# Patient Record
Sex: Female | Born: 1965 | Race: White | Hispanic: No | Marital: Married | Smoking: Never smoker
Health system: Southern US, Community
[De-identification: ages and names within clinical notes are randomized; demographics above are authoritative.]

## PROBLEM LIST (undated history)

## (undated) DIAGNOSIS — K219 Gastro-esophageal reflux disease without esophagitis: Secondary | ICD-10-CM

## (undated) DIAGNOSIS — G4730 Sleep apnea, unspecified: Secondary | ICD-10-CM

## (undated) DIAGNOSIS — F329 Major depressive disorder, single episode, unspecified: Secondary | ICD-10-CM

## (undated) DIAGNOSIS — H269 Unspecified cataract: Secondary | ICD-10-CM

## (undated) DIAGNOSIS — R519 Headache, unspecified: Secondary | ICD-10-CM

## (undated) DIAGNOSIS — D649 Anemia, unspecified: Secondary | ICD-10-CM

## (undated) DIAGNOSIS — E119 Type 2 diabetes mellitus without complications: Secondary | ICD-10-CM

## (undated) DIAGNOSIS — K2270 Barrett's esophagus without dysplasia: Secondary | ICD-10-CM

## (undated) DIAGNOSIS — F419 Anxiety disorder, unspecified: Secondary | ICD-10-CM

## (undated) DIAGNOSIS — Z8719 Personal history of other diseases of the digestive system: Secondary | ICD-10-CM

## (undated) DIAGNOSIS — Z87442 Personal history of urinary calculi: Secondary | ICD-10-CM

## (undated) DIAGNOSIS — M1990 Unspecified osteoarthritis, unspecified site: Secondary | ICD-10-CM

## (undated) DIAGNOSIS — K589 Irritable bowel syndrome without diarrhea: Secondary | ICD-10-CM

## (undated) DIAGNOSIS — B019 Varicella without complication: Secondary | ICD-10-CM

## (undated) DIAGNOSIS — I10 Essential (primary) hypertension: Secondary | ICD-10-CM

## (undated) DIAGNOSIS — F32A Depression, unspecified: Secondary | ICD-10-CM

## (undated) DIAGNOSIS — R7303 Prediabetes: Secondary | ICD-10-CM

## (undated) DIAGNOSIS — R51 Headache: Secondary | ICD-10-CM

## (undated) DIAGNOSIS — M797 Fibromyalgia: Secondary | ICD-10-CM

## (undated) DIAGNOSIS — E785 Hyperlipidemia, unspecified: Secondary | ICD-10-CM

## (undated) DIAGNOSIS — K8020 Calculus of gallbladder without cholecystitis without obstruction: Secondary | ICD-10-CM

## (undated) DIAGNOSIS — E669 Obesity, unspecified: Secondary | ICD-10-CM

## (undated) HISTORY — PX: TONSILLECTOMY: SUR1361

## (undated) HISTORY — PX: COLONOSCOPY: SHX174

## (undated) HISTORY — PX: KNEE ARTHROSCOPY: SUR90

## (undated) HISTORY — DX: Calculus of gallbladder without cholecystitis without obstruction: K80.20

## (undated) HISTORY — PX: CHOLECYSTECTOMY: SHX55

## (undated) HISTORY — DX: Personal history of other diseases of the digestive system: Z87.19

## (undated) HISTORY — PX: ABDOMINAL HYSTERECTOMY: SHX81

## (undated) HISTORY — DX: Type 2 diabetes mellitus without complications: E11.9

## (undated) HISTORY — DX: Irritable bowel syndrome, unspecified: K58.9

## (undated) HISTORY — PX: UPPER GASTROINTESTINAL ENDOSCOPY: SHX188

## (undated) HISTORY — DX: Unspecified cataract: H26.9

## (undated) HISTORY — DX: Varicella without complication: B01.9

## (undated) HISTORY — DX: Hyperlipidemia, unspecified: E78.5

## (undated) HISTORY — DX: Barrett's esophagus without dysplasia: K22.70

## (undated) HISTORY — DX: Anxiety disorder, unspecified: F41.9

## (undated) HISTORY — DX: Obesity, unspecified: E66.9

## (undated) HISTORY — PX: OTHER SURGICAL HISTORY: SHX169

## (undated) HISTORY — PX: LAPAROSCOPIC CHOLECYSTECTOMY: SUR755

---

## 2013-12-25 DIAGNOSIS — G629 Polyneuropathy, unspecified: Secondary | ICD-10-CM | POA: Insufficient documentation

## 2014-01-31 DIAGNOSIS — R7309 Other abnormal glucose: Secondary | ICD-10-CM | POA: Insufficient documentation

## 2014-01-31 DIAGNOSIS — E118 Type 2 diabetes mellitus with unspecified complications: Secondary | ICD-10-CM | POA: Insufficient documentation

## 2014-01-31 DIAGNOSIS — E236 Other disorders of pituitary gland: Secondary | ICD-10-CM | POA: Insufficient documentation

## 2014-01-31 DIAGNOSIS — R635 Abnormal weight gain: Secondary | ICD-10-CM | POA: Insufficient documentation

## 2014-01-31 DIAGNOSIS — R7303 Prediabetes: Secondary | ICD-10-CM | POA: Insufficient documentation

## 2016-12-12 ENCOUNTER — Encounter (HOSPITAL_COMMUNITY): Payer: Self-pay | Admitting: *Deleted

## 2016-12-12 ENCOUNTER — Emergency Department (HOSPITAL_BASED_OUTPATIENT_CLINIC_OR_DEPARTMENT_OTHER)
Admit: 2016-12-12 | Discharge: 2016-12-12 | Disposition: A | Discharge status: Home / Self Care | Payer: 59 | Attending: Emergency Medicine | Admitting: Emergency Medicine

## 2016-12-12 ENCOUNTER — Emergency Department (HOSPITAL_COMMUNITY)
Admission: EM | Admit: 2016-12-12 | Discharge: 2016-12-12 | Disposition: A | Discharge status: Home / Self Care | Payer: 59 | Attending: Emergency Medicine | Admitting: Emergency Medicine

## 2016-12-12 ENCOUNTER — Ambulatory Visit (HOSPITAL_COMMUNITY)
Admission: EM | Admit: 2016-12-12 | Discharge: 2016-12-12 | Disposition: A | Discharge status: Nursing Home (Includes ICF) | Payer: Self-pay

## 2016-12-12 DIAGNOSIS — I10 Essential (primary) hypertension: Secondary | ICD-10-CM | POA: Diagnosis not present

## 2016-12-12 DIAGNOSIS — M79609 Pain in unspecified limb: Secondary | ICD-10-CM | POA: Diagnosis not present

## 2016-12-12 DIAGNOSIS — Z79899 Other long term (current) drug therapy: Secondary | ICD-10-CM | POA: Diagnosis not present

## 2016-12-12 DIAGNOSIS — M7121 Synovial cyst of popliteal space [Baker], right knee: Secondary | ICD-10-CM | POA: Insufficient documentation

## 2016-12-12 DIAGNOSIS — M25561 Pain in right knee: Secondary | ICD-10-CM | POA: Diagnosis present

## 2016-12-12 HISTORY — DX: Essential (primary) hypertension: I10

## 2016-12-12 HISTORY — DX: Unspecified osteoarthritis, unspecified site: M19.90

## 2016-12-12 HISTORY — DX: Fibromyalgia: M79.7

## 2016-12-12 HISTORY — DX: Gastro-esophageal reflux disease without esophagitis: K21.9

## 2016-12-12 MED ORDER — OXYCODONE-ACETAMINOPHEN 5-325 MG PO TABS
1.0000 | ORAL_TABLET | Freq: Two times a day (BID) | ORAL | 0 refills | Status: DC | PRN
Start: 2016-12-12 — End: 2017-07-09

## 2016-12-12 NOTE — ED Triage Notes (Signed)
Pt reports right leg pain behind her knee and radiates up her leg. Reports swelling to leg last night. Is ambulatory at triage.

## 2016-12-12 NOTE — ED Provider Notes (Signed)
[facility] DEPT Provider Note   CSN: [id number] Arrival date & time: 12/12/16  [id number]  By signing my name below, I, Arianna Nassar, attest that this documentation has been prepared under the direction and in the presence of Merrily Pew, MD.  Electronically Signed: Julien Nordmann, ED Scribe. 12/12/16. 6:15 PM.    History   Chief Complaint Chief Complaint  Patient presents with  . Leg Pain    The history is provided by the patient. No language interpreter was used.   HPI Comments: Dawn Reynolds is a 51 y.o. female who presents to the Emergency Department complaining of intermittent, gradual worsening, right leg pain that begins behind her right knee and radiates into right leg that began last night. She describes her pain a sharp, shooting sensation. Pt has been driving back and forth from [place] at least 1-2x a week for the past 4 months. She expresses she is unable to sleep at night due to increased pain. She reports taking 800 mg of ibuprofen and flexeril today to alleviate her pain without relief. She has a hx of Baker cyst's in the past and reports that this current pain feels very different. Pt denies fever, chest pain, or shortness of breath.   Past Medical History:  Diagnosis Date  . Arthritis   . Fibromyalgia   . GERD (gastroesophageal reflux disease)   . Hypertension     There are no active problems to display for this patient.   History reviewed. No pertinent surgical history.  OB History    No data available       Home Medications    Prior to Admission medications   Medication Sig Start Date End Date Taking? Authorizing Provider  amLODipine (NORVASC) 10 MG tablet Take 10 mg by mouth daily.   Yes Historical Provider, MD  BIOTIN PO Take 1 capsule by mouth daily.   Yes Historical Provider, MD  Calcium Carbonate-Vitamin D (OSCAL 500/200 D-3 PO) Take 1 capsule by mouth daily.   Yes Historical Provider, MD  cyclobenzaprine (FLEXERIL) 10 MG tablet Take 1 tablet by  mouth daily as needed for muscle spasms.    Yes Historical Provider, MD  DULoxetine (CYMBALTA) 60 MG capsule Take 1 capsule by mouth daily.   Yes Historical Provider, MD  hydrochlorothiazide (MICROZIDE) 12.5 MG capsule Take 12.5 mg by mouth daily.   Yes Historical Provider, MD  ibuprofen (ADVIL,MOTRIN) 800 MG tablet Take 800 mg by mouth every 8 (eight) hours as needed for moderate pain.   Yes Historical Provider, MD  meloxicam (MOBIC) 15 MG tablet Take 15 mg by mouth daily.   Yes Historical Provider, MD  omeprazole (PRILOSEC) 20 MG capsule Take 1 capsule by mouth daily.   Yes Historical Provider, MD  OVER THE COUNTER MEDICATION Take 1 capsule by mouth daily. Omega red fish oil   Yes Historical Provider, MD  oxyCODONE-acetaminophen (PERCOCET) 5-325 MG tablet Take 1-2 tablets by mouth every 12 (twelve) hours as needed. 12/12/16   Merrily Pew, MD    Family History History reviewed. No pertinent family history.  Social History Social History  Substance Use Topics  . Smoking status: Never Smoker  . Smokeless tobacco: Not on file  . Alcohol use Yes     Comment: occ     Allergies   Demerol [meperidine]   Review of Systems Review of Systems  Constitutional: Negative for fever.  Respiratory: Negative for shortness of breath.   Cardiovascular: Negative for chest pain.  Musculoskeletal: Positive for myalgias.  Right leg pain  All other systems reviewed and are negative.    Physical Exam Updated Vital Signs BP 135/70   Pulse 86   Temp 98.5 F (36.9 C)   Resp 18   SpO2 96%   Physical Exam  Constitutional: She is oriented to person, place, and time. She appears well-developed and well-nourished.  HENT:  Head: Normocephalic.  Eyes: Conjunctivae and EOM are normal.  Neck: Normal range of motion.  Pulmonary/Chest: Effort normal.  Abdominal: Soft. She exhibits no distension. There is no tenderness.  Musculoskeletal: Normal range of motion. She exhibits tenderness.    Tenderness in posterior upper calf muscles with dorsal flexion of right foot, tenderness along medial aspect of gastrocnemius, no swelling or erythema of right leg  Neurological: She is alert and oriented to person, place, and time.  Skin: Skin is warm and dry.  Psychiatric: She has a normal mood and affect.  Nursing note and vitals reviewed.    ED Treatments / Results  DIAGNOSTIC STUDIES: Oxygen Saturation is 95% on RA, adequate by my interpretation.  COORDINATION OF CARE:  6:02 PM Discussed treatment plan with pt at bedside and pt agreed to plan.  Labs (all labs ordered are listed, but only abnormal results are displayed) Labs Reviewed - No data to display  EKG  EKG Interpretation None       Radiology No results found.  Procedures Procedures (including critical care time)  Medications Ordered in ED Medications - No data to display   Initial Impression / Assessment and Plan / ED Course  I have reviewed the triage vital signs and the nursing notes.  Pertinent labs & imaging results that were available during my care of the patient were reviewed by me and considered in my medical decision making (see chart for details).     Bakers cyst. Pain control and dc w/ pcp follow up.   Final Clinical Impressions(s) / ED Diagnoses   Final diagnoses:  Synovial cyst of right popliteal space    New Prescriptions Discharge Medication List as of 12/12/2016  7:19 PM    START taking these medications   Details  oxyCODONE-acetaminophen (PERCOCET) 5-325 MG tablet Take 1-2 tablets by mouth every 12 (twelve) hours as needed., Starting Sun 12/12/2016, Print       I personally performed the services described in this documentation, which was scribed in my presence. The recorded information has been reviewed and is accurate.    Merrily Pew, MD 12/13/16 [phone]

## 2016-12-12 NOTE — ED Notes (Signed)
Vascular at bedside

## 2016-12-12 NOTE — Progress Notes (Signed)
VASCULAR LAB PRELIMINARY  PRELIMINARY  PRELIMINARY  PRELIMINARY  Right lower extremity venous duplex completed.    Preliminary report:  There is no DVT or SVT noted in the right lower extremity.  There is a moderate to large sized, intact Baker's cyst noted in the right popliteal fossa as well as fluid noted at the medial knee.  Gave report to Dr. Alfonso Ellis, [place], RVT 12/12/2016, 7:15 PM

## 2017-01-05 DIAGNOSIS — E8941 Symptomatic postprocedural ovarian failure: Secondary | ICD-10-CM | POA: Diagnosis not present

## 2017-01-05 DIAGNOSIS — N951 Menopausal and female climacteric states: Secondary | ICD-10-CM | POA: Diagnosis not present

## 2017-01-06 DIAGNOSIS — Z1231 Encounter for screening mammogram for malignant neoplasm of breast: Secondary | ICD-10-CM | POA: Diagnosis not present

## 2017-01-06 DIAGNOSIS — Z780 Asymptomatic menopausal state: Secondary | ICD-10-CM | POA: Diagnosis not present

## 2017-01-06 DIAGNOSIS — R29890 Loss of height: Secondary | ICD-10-CM | POA: Diagnosis not present

## 2017-02-21 DIAGNOSIS — M797 Fibromyalgia: Secondary | ICD-10-CM | POA: Diagnosis not present

## 2017-02-21 DIAGNOSIS — I10 Essential (primary) hypertension: Secondary | ICD-10-CM | POA: Diagnosis not present

## 2017-02-21 DIAGNOSIS — E785 Hyperlipidemia, unspecified: Secondary | ICD-10-CM | POA: Diagnosis not present

## 2017-02-21 DIAGNOSIS — M159 Polyosteoarthritis, unspecified: Secondary | ICD-10-CM | POA: Diagnosis not present

## 2017-02-22 DIAGNOSIS — I10 Essential (primary) hypertension: Secondary | ICD-10-CM | POA: Diagnosis not present

## 2017-04-01 MED FILL — DULoxetine HCL 60 MG CPEP: 60 | 90 days supply | Qty: 90 | Fill #0

## 2017-04-01 MED FILL — HYDROCHLOROTHIAZIDE 12.5 MG: 12.5 | 3 days supply | Qty: 90 | Fill #0

## 2017-04-01 MED FILL — AMLODIPINE BESYLATE 10 MG T: 10 | 90 days supply | Qty: 90 | Fill #0

## 2017-04-01 MED FILL — MELOXICAM 15 MG TABLET: 15 | 90 days supply | Qty: 90 | Fill #0

## 2017-04-06 MED FILL — OMEPRAZOLE DR 20 MG CAPSULE: 20 | 90 days supply | Qty: 90 | Fill #0

## 2017-04-06 MED FILL — CYCLOBENZAPRINE 10 MG TAB: 10 | 90 days supply | Qty: 90 | Fill #0

## 2017-04-30 ENCOUNTER — Encounter (HOSPITAL_COMMUNITY): Payer: Self-pay | Admitting: *Deleted

## 2017-04-30 ENCOUNTER — Ambulatory Visit (HOSPITAL_COMMUNITY)
Admission: EM | Admit: 2017-04-30 | Discharge: 2017-04-30 | Disposition: A | Discharge status: Home / Self Care | Payer: 59 | Attending: Family Medicine | Admitting: Family Medicine

## 2017-04-30 DIAGNOSIS — J069 Acute upper respiratory infection, unspecified: Secondary | ICD-10-CM | POA: Diagnosis not present

## 2017-04-30 DIAGNOSIS — B9789 Other viral agents as the cause of diseases classified elsewhere: Secondary | ICD-10-CM | POA: Diagnosis not present

## 2017-04-30 MED ORDER — AZITHROMYCIN 250 MG PO TABS: 250.0000 mg | ORAL_TABLET | Freq: Every day | ORAL | 0 refills | Status: DC

## 2017-04-30 MED ORDER — ONDANSETRON 4 MG PO TBDP: 4.0000 mg | ORAL_TABLET | Freq: Three times a day (TID) | ORAL | 0 refills | Status: DC | PRN

## 2017-04-30 MED ORDER — IPRATROPIUM BROMIDE 0.06 % NA SOLN: 2.0000 | Freq: Four times a day (QID) | NASAL | 0 refills | Status: DC

## 2017-04-30 NOTE — ED Provider Notes (Signed)
CSN: [id number]     Arrival date & time 04/30/17  1405 History   None    Chief Complaint  Patient presents with  . Cough   (Consider location/radiation/quality/duration/timing/severity/associated sxs/prior Treatment) Patient c/o cough and uri sx's for a week.  Patient c/o NVD x 1 week.   The history is provided by the patient.  Cough  Cough characteristics:  Productive Sputum characteristics:  White Severity:  Moderate Onset quality:  Sudden Duration:  1 week Timing:  Constant Chronicity:  New Relieved by:  Nothing Worsened by:  Nothing Ineffective treatments:  None tried Associated symptoms: sore throat     Past Medical History:  Diagnosis Date  . Arthritis   . Fibromyalgia   . GERD (gastroesophageal reflux disease)   . Hypertension    History reviewed. No pertinent surgical history. History reviewed. No pertinent family history. Social History  Substance Use Topics  . Smoking status: Never Smoker  . Smokeless tobacco: Not on file  . Alcohol use Yes     Comment: occ   OB History    No data available     Review of Systems  Constitutional: Positive for fatigue.  HENT: Positive for congestion, postnasal drip and sore throat.   Eyes: Negative.   Respiratory: Positive for cough.   Cardiovascular: Negative.   Gastrointestinal: Negative.   Endocrine: Negative.   Genitourinary: Negative.   Musculoskeletal: Negative.   Allergic/Immunologic: Negative.   Neurological: Negative.   Hematological: Negative.   Psychiatric/Behavioral: Negative.     Allergies  Demerol [meperidine]  Home Medications   Prior to Admission medications   Medication Sig Start Date End Date Taking? Authorizing Provider  amLODipine (NORVASC) 10 MG tablet Take 10 mg by mouth daily.    [provider]  azithromycin (ZITHROMAX) 250 MG tablet Take 1 tablet (250 mg total) by mouth daily. Take first 2 tablets together, then 1 every day until finished. 04/30/17   Lysbeth Penner, FNP   BIOTIN PO Take 1 capsule by mouth daily.    [provider]  Calcium Carbonate-Vitamin D (OSCAL 500/200 D-3 PO) Take 1 capsule by mouth daily.    [provider]  cyclobenzaprine (FLEXERIL) 10 MG tablet Take 1 tablet by mouth daily as needed for muscle spasms.     [provider]  DULoxetine (CYMBALTA) 60 MG capsule Take 1 capsule by mouth daily.    [provider]  hydrochlorothiazide (MICROZIDE) 12.5 MG capsule Take 12.5 mg by mouth daily.    [provider]  ibuprofen (ADVIL,MOTRIN) 800 MG tablet Take 800 mg by mouth every 8 (eight) hours as needed for moderate pain.    [provider]  ipratropium (ATROVENT) 0.06 % nasal spray Place 2 sprays into both nostrils 4 (four) times daily. 04/30/17   Lysbeth Penner, FNP  meloxicam (MOBIC) 15 MG tablet Take 15 mg by mouth daily.    [provider]  omeprazole (PRILOSEC) 20 MG capsule Take 1 capsule by mouth daily.    [provider]  OVER THE COUNTER MEDICATION Take 1 capsule by mouth daily. Omega red fish oil    [provider]  oxyCODONE-acetaminophen (PERCOCET) 5-325 MG tablet Take 1-2 tablets by mouth every 12 (twelve) hours as needed. 12/12/16   Mesner, Corene Cornea, MD   Meds Ordered and Administered this Visit  Medications - No data to display  BP 124/82 (BP Location: Right Arm)   Pulse 74   Temp 98.3 F (36.8 C)   Resp 18  SpO2 99%  No data found.   Physical Exam  Constitutional: She is oriented to person, place, and time. She appears well-developed and well-nourished.  HENT:  Head: Normocephalic and atraumatic.  Eyes: Conjunctivae and EOM are normal. Pupils are equal, round, and reactive to light.  Neck: Normal range of motion. Neck supple.  Cardiovascular: Normal rate, regular rhythm and normal heart sounds.   Pulmonary/Chest: Effort normal and breath sounds normal.  Abdominal: Soft. Bowel sounds are normal.  Musculoskeletal: Normal range of motion.   Neurological: She is alert and oriented to person, place, and time.  Nursing note and vitals reviewed.   Urgent Care Course     Procedures (including critical care time)  Labs Review Labs Reviewed - No data to display  Imaging Review No results found.   Visual Acuity Review  Right Eye Distance:   Left Eye Distance:   Bilateral Distance:    Right Eye Near:   Left Eye Near:    Bilateral Near:         MDM   1. Viral URI with cough    ZPak as directed Atrovent Nasal spray Zofran ODT 4mg  one po tid prn #21  Push po fluids, rest, tylenol and motrin otc prn as directed for fever, arthralgias, and myalgias.  Follow up prn if sx's continue or persist.    Lysbeth Penner, FNP 04/30/17 1513

## 2017-04-30 NOTE — ED Triage Notes (Signed)
Cough   Congestion  Body   Aches  r  Ear   Stopped  Up   Diarrhea   And  Vomiting  X   1    Week

## 2017-07-01 MED FILL — AMLODIPINE BESYLATE 10 MG T: 10 | 90 days supply | Qty: 90 | Fill #1

## 2017-07-01 MED FILL — MELOXICAM 15 MG TABLET: 15 | 90 days supply | Qty: 90 | Fill #1

## 2017-07-01 MED FILL — DULoxetine HCL 60 MG CPEP: 60 | 90 days supply | Qty: 90 | Fill #1

## 2017-07-01 MED FILL — HYDROCHLOROTHIAZIDE 12.5 MG: 12.5 | 3 days supply | Qty: 90 | Fill #1

## 2017-07-01 MED FILL — OMEPRAZOLE 20 MG CAP: 20 | 90 days supply | Qty: 90 | Fill #1

## 2017-07-09 ENCOUNTER — Observation Stay (HOSPITAL_COMMUNITY)
Admission: EM | Admit: 2017-07-09 | Discharge: 2017-07-10 | Disposition: A | Discharge status: Home / Self Care | Payer: 59 | Attending: Internal Medicine | Admitting: Internal Medicine

## 2017-07-09 ENCOUNTER — Encounter (HOSPITAL_COMMUNITY): Payer: Self-pay | Admitting: *Deleted

## 2017-07-09 ENCOUNTER — Emergency Department (HOSPITAL_COMMUNITY): Payer: 59

## 2017-07-09 DIAGNOSIS — R079 Chest pain, unspecified: Secondary | ICD-10-CM | POA: Diagnosis not present

## 2017-07-09 DIAGNOSIS — M797 Fibromyalgia: Secondary | ICD-10-CM

## 2017-07-09 DIAGNOSIS — Z79899 Other long term (current) drug therapy: Secondary | ICD-10-CM | POA: Insufficient documentation

## 2017-07-09 DIAGNOSIS — I10 Essential (primary) hypertension: Secondary | ICD-10-CM

## 2017-07-09 DIAGNOSIS — Z8249 Family history of ischemic heart disease and other diseases of the circulatory system: Secondary | ICD-10-CM | POA: Insufficient documentation

## 2017-07-09 DIAGNOSIS — Z733 Stress, not elsewhere classified: Secondary | ICD-10-CM | POA: Diagnosis not present

## 2017-07-09 DIAGNOSIS — Z885 Allergy status to narcotic agent status: Secondary | ICD-10-CM | POA: Diagnosis not present

## 2017-07-09 LAB — BASIC METABOLIC PANEL
ANION GAP: 9 (ref 5–15)
BUN: 26 mg/dL — ABNORMAL HIGH (ref 6–20)
CO2: 25 mmol/L (ref 22–32)
Calcium: 9.5 mg/dL (ref 8.9–10.3)
Chloride: 103 mmol/L (ref 101–111)
Creatinine, Ser: 0.88 mg/dL (ref 0.44–1.00)
Glucose, Bld: 102 mg/dL — ABNORMAL HIGH (ref 65–99)
Potassium: 3.9 mmol/L (ref 3.5–5.1)
SODIUM: 137 mmol/L (ref 135–145)

## 2017-07-09 LAB — CBC
HCT: 42.1 % (ref 36.0–46.0)
HEMOGLOBIN: 14.5 g/dL (ref 12.0–15.0)
MCH: 29.7 pg (ref 26.0–34.0)
MCHC: 34.4 g/dL (ref 30.0–36.0)
MCV: 86.1 fL (ref 78.0–100.0)
Platelets: 268 10*3/uL (ref 150–400)
RBC: 4.89 MIL/uL (ref 3.87–5.11)
RDW: 14.7 % (ref 11.5–15.5)
WBC: 8.9 10*3/uL (ref 4.0–10.5)

## 2017-07-09 LAB — I-STAT TROPONIN, ED: TROPONIN I, POC: 0 ng/mL (ref 0.00–0.08)

## 2017-07-09 LAB — TSH: TSH: 4.689 u[IU]/mL — ABNORMAL HIGH (ref 0.350–4.500)

## 2017-07-09 LAB — HEMOGLOBIN A1C
HEMOGLOBIN A1C: 5.7 % — AB (ref 4.8–5.6)
MEAN PLASMA GLUCOSE: 116.89 mg/dL

## 2017-07-09 LAB — TROPONIN I
Troponin I: <0.03 ng/mL (ref <0.03)
Troponin I: <0.03 ng/mL (ref <0.03)

## 2017-07-09 MED ORDER — ACETAMINOPHEN 650 MG RE SUPP: 650.0000 mg | Freq: Four times a day (QID) | RECTAL | Status: DC | PRN

## 2017-07-09 MED ORDER — CYCLOBENZAPRINE HCL 10 MG PO TABS
10.0000 mg | ORAL_TABLET | Freq: Every day | ORAL | Status: DC | PRN
  Administered 2017-07-09: 10 mg via ORAL
  Filled 2017-07-09: qty 1

## 2017-07-09 MED ORDER — HYDROCHLOROTHIAZIDE 12.5 MG PO CAPS: 12.5000 mg | ORAL_CAPSULE | Freq: Every day | ORAL | Status: DC

## 2017-07-09 MED ORDER — AMLODIPINE BESYLATE 5 MG PO TABS: 10.0000 mg | ORAL_TABLET | Freq: Every day | ORAL | Status: DC

## 2017-07-09 MED ORDER — HYDROCHLOROTHIAZIDE 12.5 MG PO CAPS
12.5000 mg | ORAL_CAPSULE | Freq: Every day | ORAL | Status: DC
  Administered 2017-07-09: 12.5 mg via ORAL
  Filled 2017-07-09 (×2): qty 1

## 2017-07-09 MED ORDER — AMLODIPINE BESYLATE 10 MG PO TABS
10.0000 mg | ORAL_TABLET | Freq: Every day | ORAL | Status: DC
  Administered 2017-07-09: 10 mg via ORAL
  Filled 2017-07-09: qty 1
  Filled 2017-07-09: qty 2

## 2017-07-09 MED ORDER — ASPIRIN 81 MG PO CHEW
324.0000 mg | CHEWABLE_TABLET | Freq: Once | ORAL | Status: AC
  Administered 2017-07-09: 324 mg via ORAL
  Filled 2017-07-09: qty 4

## 2017-07-09 MED ORDER — DULOXETINE HCL 60 MG PO CPEP
60.0000 mg | ORAL_CAPSULE | Freq: Every day | ORAL | Status: DC
  Administered 2017-07-09: 60 mg via ORAL
  Filled 2017-07-09 (×2): qty 1

## 2017-07-09 MED ORDER — ASPIRIN EC 81 MG PO TBEC
81.0000 mg | DELAYED_RELEASE_TABLET | Freq: Every day | ORAL | Status: DC
  Filled 2017-07-09: qty 1

## 2017-07-09 MED ORDER — PANTOPRAZOLE SODIUM 40 MG PO TBEC: 40.0000 mg | DELAYED_RELEASE_TABLET | Freq: Every day | ORAL | Status: DC

## 2017-07-09 MED ORDER — DULOXETINE HCL 60 MG PO CPEP: 60.0000 mg | ORAL_CAPSULE | Freq: Every day | ORAL | Status: DC

## 2017-07-09 MED ORDER — NITROGLYCERIN 0.4 MG SL SUBL
0.4000 mg | SUBLINGUAL_TABLET | SUBLINGUAL | Status: DC | PRN
  Administered 2017-07-09 (×5): 0.4 mg via SUBLINGUAL
  Filled 2017-07-09 (×4): qty 1

## 2017-07-09 MED ORDER — OMEPRAZOLE 20 MG PO CPDR
20.0000 mg | DELAYED_RELEASE_CAPSULE | Freq: Every day | ORAL | Status: DC
  Administered 2017-07-09: 20 mg via ORAL
  Filled 2017-07-09 (×2): qty 1

## 2017-07-09 MED ORDER — ACETAMINOPHEN 325 MG PO TABS: 650.0000 mg | ORAL_TABLET | Freq: Four times a day (QID) | ORAL | Status: DC | PRN

## 2017-07-09 MED ORDER — ACETAMINOPHEN 500 MG PO TABS: 1000.0000 mg | ORAL_TABLET | Freq: Four times a day (QID) | ORAL | Status: DC | PRN

## 2017-07-09 MED ORDER — SODIUM CHLORIDE 0.9% FLUSH
3.0000 mL | Freq: Two times a day (BID) | INTRAVENOUS | Status: DC
  Administered 2017-07-09: 3 mL via INTRAVENOUS

## 2017-07-09 MED ORDER — ENOXAPARIN SODIUM 40 MG/0.4ML ~~LOC~~ SOLN
40.0000 mg | SUBCUTANEOUS | Status: DC
  Administered 2017-07-09: 40 mg via SUBCUTANEOUS
  Filled 2017-07-09 (×2): qty 0.4

## 2017-07-09 NOTE — H&P (Signed)
Date: 07/09/2017               Patient Name:  Dawn Reynolds MRN: [id number]  DOB: 1966/05/25 Age / Sex: 51 y.o., female   PCP: System, Pcp Not In         Medical Service: Internal Medicine Teaching Service         Attending Physician: Dr. Bartholomew Crews, MD    First Contact: Dr. Lurlean Horns Pager: [phone]  Second Contact: Dr. Posey Pronto Pager: [phone]       After Hours (After 5p/  First Contact Pager: [phone]  weekends / holidays): Second Contact Pager: [phone]   Chief Complaint: Chest pain   History of Present Illness:  Dawn Reynolds is a 51 y.o. female with history of HTN, GERD, and fibromyalgia who presents to the ED with substernal, sharp chest pain that started yesterday (8/31) at around 9 pm. The pain is constant, radiates to the R neck and is associated with diaphoresis. Denies shortness of breath and emesis, but endorses nausea and 2 episodes of diarrhea. She was at her parent's house knitting when the pain started. Denies worsening of pain on exertion or improvement with rest. Denies fever, chills, abdominal pain, N/V, and association of pain with food intake. 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 States she thought it could be GI related given history of GERD or anxiety as she is currently going through a divorce and moving in with her parents. She went to bed, but was unable to sleep secondary to ongoing chest pain through the night. She then woke up this morning and went to work, but pain worsened and she decided to go to the ED. States she has had angina in the past and underwent ischemic evaluation in 2016 with nl LHC and exercise stress test. No record of this in her chart, but she brought health care records with her. She was recently started on vaginal estrogen for vaginal dryness. No other changes in medication.   ED course: Patient was HDS on arrival and has remained HDS. She received high dose aspirin and SL nitro x3 given with  resolution of chest pain. EKG showed ST depression on inferior leads. Initial troponin negative and CXR nl. She had active chest pain when seen but looked comfortable during encounter.    Review of Systems: A complete ROS was negative except as per HPI.    Meds:  No current facility-administered medications on file prior to encounter.    Current Outpatient Prescriptions on File Prior to Encounter  Medication Sig Dispense Refill  . amLODipine (NORVASC) 10 MG tablet Take 10 mg by mouth daily.    Marland Kitchen BIOTIN PO Take 1 capsule by mouth daily.    . Calcium Carbonate-Vitamin D (OSCAL 500/200 D-3 PO) Take 1 capsule by mouth daily.    . cyclobenzaprine (FLEXERIL) 10 MG tablet Take 1 tablet by mouth daily as needed for muscle spasms.     . DULoxetine (CYMBALTA) 60 MG capsule Take 1 capsule by mouth daily.    . hydrochlorothiazide (MICROZIDE) 12.5 MG capsule Take 12.5 mg by mouth daily.    Marland Kitchen ibuprofen (ADVIL,MOTRIN) 800 MG tablet Take 800 mg by mouth every 8 (eight) hours as needed for moderate pain.    . meloxicam (MOBIC) 15 MG tablet Take 15 mg by mouth daily.    Marland Kitchen omeprazole (PRILOSEC) 20 MG capsule Take 20 mg by mouth daily.     Marland Kitchen OVER THE  COUNTER MEDICATION Take 1 capsule by mouth daily. Omega red fish oil    . ipratropium (ATROVENT) 0.06 % nasal spray Place 2 sprays into both nostrils 4 (four) times daily. (Patient not taking: Reported on 07/09/2017) 15 mL 0  . ondansetron (ZOFRAN ODT) 4 MG disintegrating tablet Take 1 tablet (4 mg total) by mouth every 8 (eight) hours as needed for nausea or vomiting. (Patient not taking: Reported on 07/09/2017) 20 tablet 0     Allergies: Allergies as of 07/09/2017 - Review Complete 07/09/2017  Allergen Reaction Noted  . Demerol [meperidine] Nausea And Vomiting 12/12/2016   Past Medical History:  Diagnosis Date  . Arthritis   . Fibromyalgia   . GERD (gastroesophageal reflux disease)   . Hypertension     Family History:  Family History  Problem  Relation Age of Onset  . Heart disease Father   . Mitral valve prolapse Mother     Social History:  Patient lives at home with parents and works as a Marine scientist at [employer]. States she drinks 1 glass of wine a week at the most. Denies tobacco and drug use.    Physical Exam: Blood pressure (!) 112/95, pulse 66, temperature 98.2 F (36.8 C), temperature source Oral, resp. rate (!) 23, weight 232 lb (105.2 kg), SpO2 98 %.  General: pleasant female, sitting in bed comfortably, well-developed, in no acute distress  Cardiac: regular rate and rhythm, nl S1/S2, no murmurs, rubs or gallops  Pulm: CTAB, no wheezes or crackles, no increased work of breathing  Neuro: A&Ox3, able to move all 4 extremities, no focal deficits noted  Ext: warm and well perfused, no peripheral edema, 2+ DP pulses bilaterally    EKG: personally reviewed my interpretation is SR at 69 bpm, nl intervals, no axis deviation. ST depression on leads II, III, and aVF. No T wave abnormalities or ST elevations noted. No previous EKG available.   CXR: personally reviewed my interpretation is patent airway, no enlargement of the cardiac silhouette, no lung parenchymal abnormalities noted   Assessment & Plan by Problem:  Dawn Reynolds is a 52 y.o. female with history of HTN, GERD, and fibromyalgia who presents with substernal, sharp chest pain associated with diaphoresis of 1 day duration concerning for ACS given angina, resolution with SL nitro, mild ST depressions on EKG, history of HTN, and significant family history of heart disease. Anxiety also possible given recent stressors reported by patient. GERD possible as well, though patient is currently on prilosec and compliant with it. Pulmonary etiology also in the differential. However, no tachycardia or shortness of breath that would be concerning for PE or PTX. There are also no signs of respiratory infection concerning for PNA.   Chest pain, atypical: ACS vs anxiety vs GERD. Patient  is having active chest pain that resolves with SL nitro. She is currently HDS. EKG with very mild ST depressions in inferior leads and initial troponin negative. Will continue to trend troponin and monitor patient's chest pain.  - s/p ASA 324mg  and SL nitro x3 - Trending troponin  - Repeat EKG in AM  - On telemetry  - SL nitro 0.4mg  PRN as needed for pain  - F/u CBC and BMP in 9/2 - F/u TSH and Hgb A1c   HTN: home regimen includes amlodipine 10 mg daily and HCTZ 12.5 mg daily  - Continue home antihypertensive medications    Fibromyalgia:  - Continue home Cymbalta 60 mg daily and Flexeril 10mg  PRN   GERD:  -  Continue home Prilosec 20 mg daily   FEN/GI - None  - Replete electrolytes as needed  - HH diet  - GI ppx: continue home Prilosec  - VTE ppx: SQ lovenox  Code status: Full code, confirmed on admission   Dispo: Admit patient to Observation with expected length of stay less than 2 midnights.  Signed: Welford Roche, MD  Internal Medicine PGY-1  P [phone]

## 2017-07-09 NOTE — ED Notes (Signed)
Taken to xray.

## 2017-07-09 NOTE — ED Provider Notes (Signed)
[facility] DEPT Provider Note   CSN: [id number] Arrival date & time: 07/09/17  2694     History   Chief Complaint Chief Complaint  Patient presents with  . Chest Pain    HPI Dawn Reynolds Comment is a 51 y.o. female.  HPI  51 year old female presents today with complaints of chest pain.  Patient notes symptoms started approximately 9 PM last night while at home resting.  She notes an achy sensation in her mid to left anterior chest.  She notes occasionally she has a sharp twinge of pain with some radiation to the back.  Patient reports symptoms are slightly worsened with deep inspiration.  Patient denies any alleviating or any other exacerbating factors.  Patient did not take any medication for this prior to arrival.  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  Patient denies any shortness of breath, cough or congestion, fever chills.  She denies any indigestion or abdominal pain.  Patient denies any lower extremity swelling or edema other than her baseline bilateral dependent edema from working.  She denies any history of DVT or PE.  She is a non-smoker, no recent surgeries, no prolonged immobilization, or any other significant risk factors.  Patient does note that she recently started topical vaginal estrogen cream.  Patient notes that she has had a history of chest pain.  She was seen at [facility] in [place] for chest pain approximately 2 years ago.  She had a cardiac catheterization that showed no significant findings as well as a stress test.  Patient notes that her father suffered a heart attack at a very early age with multiple cardiac surgeries.  Patient denies any drug use, denies smoking.  Patient notes that she is under a significant amount of stress.  She notes that she recently moved from [place] and is working on selling her house.  She notes she  is currently going through divorce as well.   Past Medical History:  Diagnosis Date  . Arthritis   . Fibromyalgia   . GERD (gastroesophageal reflux disease)   . Hypertension     Patient Active Problem List   Diagnosis Date Noted  . Chest pain 07/09/2017  . Fibromyalgia 07/09/2017  . Hypertension 07/09/2017    Past Surgical History:  Procedure Laterality Date  . ABDOMINAL HYSTERECTOMY      OB History    No data available       Home Medications    Prior to Admission medications   Medication Sig Start Date End Date Taking? Authorizing Provider  amLODipine (NORVASC) 10 MG tablet Take 10 mg by mouth daily.    [provider]  BIOTIN PO Take 1 capsule by mouth daily.    [provider]  Calcium Carbonate-Vitamin D (OSCAL 500/200 D-3 PO) Take 1 capsule by mouth daily.    [provider]  cyclobenzaprine (FLEXERIL) 10 MG tablet Take 1 tablet by mouth daily as needed for muscle spasms.     [provider]  DULoxetine (CYMBALTA) 60 MG capsule Take 1 capsule by mouth daily.    [provider]  hydrochlorothiazide (MICROZIDE) 12.5 MG capsule Take 12.5 mg by mouth daily.    [provider]  ibuprofen (ADVIL,MOTRIN) 800 MG tablet Take 800 mg by mouth every 8 (eight) hours as needed for moderate pain.    [provider]  ipratropium (ATROVENT) 0.06 %  nasal spray Place 2 sprays into both nostrils 4 (four) times daily. 04/30/17   Lysbeth Penner, FNP  meloxicam (MOBIC) 15 MG tablet Take 15 mg by mouth daily.    [provider]  omeprazole (PRILOSEC) 20 MG capsule Take 1 capsule by mouth daily.    [provider]  ondansetron (ZOFRAN ODT) 4 MG disintegrating tablet Take 1 tablet (4 mg total) by mouth every 8 (eight) hours as needed for nausea or vomiting. 04/30/17   Lysbeth Penner, FNP  OVER THE COUNTER MEDICATION Take 1 capsule by mouth daily. Omega red fish oil    [provider]    Family  History Family History  Problem Relation Age of Onset  . Heart disease Father   . Mitral valve prolapse Mother     Social History Social History  Substance Use Topics  . Smoking status: Never Smoker  . Smokeless tobacco: Not on file  . Alcohol use Yes     Comment: occ     Allergies   Demerol [meperidine]   Review of Systems Review of Systems  All other systems reviewed and are negative.    Physical Exam Updated Vital Signs BP 126/80   Pulse 71   Temp 98.2 F (36.8 C) (Oral)   Resp 18   Wt 105.2 kg (232 lb)   SpO2 94%   Physical Exam  Constitutional: She is oriented to person, place, and time. She appears well-developed and well-nourished.  HENT:  Head: Normocephalic and atraumatic.  Eyes: Pupils are equal, round, and reactive to light. Conjunctivae are normal. Right eye exhibits no discharge. Left eye exhibits no discharge. No scleral icterus.  Neck: Normal range of motion. No JVD present. No tracheal deviation present.  Cardiovascular: Normal rate, regular rhythm, normal heart sounds and intact distal pulses.  Exam reveals no gallop and no friction rub.   No murmur heard. Pulmonary/Chest: Effort normal and breath sounds normal. No stridor. No respiratory distress. She has no wheezes. She has no rales. She exhibits no tenderness.  Abdominal: Soft. She exhibits no distension and no mass. There is no tenderness. There is no rebound and no guarding. No hernia.  Musculoskeletal: Normal range of motion. She exhibits no edema.  Neurological: She is alert and oriented to person, place, and time. Coordination normal.  Psychiatric: She has a normal mood and affect. Her behavior is normal. Judgment and thought content normal.  Nursing note and vitals reviewed.    ED Treatments / Results  Labs (all labs ordered are listed, but only abnormal results are displayed) Labs Reviewed  BASIC METABOLIC PANEL - Abnormal; Notable for the following:       Result Value   Glucose,  Bld 102 (*)    BUN 26 (*)    All other components within normal limits  CBC  HIV ANTIBODY (ROUTINE TESTING)  HEMOGLOBIN A1C  TROPONIN I  TROPONIN I  TSH  TROPONIN I  I-STAT TROPONIN, ED    EKG  EKG Interpretation  Date/Time:  Saturday July 09 2017 10:03:28 EDT Ventricular Rate:  69 PR Interval:  142 QRS Duration: 92 QT Interval:  390 QTC Calculation: 417 R Axis:   -10 Text Interpretation:  Normal sinus rhythm Slight ST depression inferior leads Abnormal ECG No previous ECGs available Reconfirmed by Gareth Morgan [phone]) on 07/09/2017 10:30:04 AM       Radiology Dg Chest 2 View  Result Date: 07/09/2017 CLINICAL DATA:  Left chest pain, diarrhea and diaphoresis EXAM: CHEST  2  VIEW COMPARISON:  None available FINDINGS: Normal heart size and vascularity. Mild chronic bronchitic change centrally and interstitial prominence, nonspecific. No focal pneumonia, collapse or consolidation. External artifact overlies the upper lobes bilaterally. Trachea is midline. No effusion or pneumothorax. Sclerotic bone island in the proximal right humerus. No acute osseous finding. IMPRESSION: Chronic bronchitic change and slight interstitial prominence, nonspecific. No superimposed pneumonia or CHF Electronically Signed   By: Jerilynn Mages.  Shick M.D.   On: 07/09/2017 10:28    Procedures Procedures (including critical care time)  Medications Ordered in ED Medications  nitroGLYCERIN (NITROSTAT) SL tablet 0.4 mg (0.4 mg Sublingual Given 07/09/17 1221)  enoxaparin (LOVENOX) injection 40 mg (not administered)  sodium chloride flush (NS) 0.9 % injection 3 mL (3 mLs Intravenous Not Given 07/09/17 1329)  acetaminophen (TYLENOL) tablet 650 mg (not administered)    Or  acetaminophen (TYLENOL) suppository 650 mg (not administered)  aspirin EC tablet 81 mg (not administered)  amLODipine (NORVASC) tablet 10 mg (not administered)  DULoxetine (CYMBALTA) DR capsule 60 mg (not administered)  hydrochlorothiazide  (MICROZIDE) capsule 12.5 mg (not administered)  cyclobenzaprine (FLEXERIL) tablet 10 mg (not administered)  omeprazole (PRILOSEC) capsule 20 mg (not administered)  aspirin chewable tablet 324 mg (324 mg Oral Given 07/09/17 1130)     Initial Impression / Assessment and Plan / ED Course  I have reviewed the triage vital signs and the nursing notes.  Pertinent labs & imaging results that were available during my care of the patient were reviewed by me and considered in my medical decision making (see chart for details).      Final Clinical Impressions(s) / ED Diagnoses   Final diagnoses:  Chest pain, unspecified type    Labs: I-STAT troponin, BMP, CBC,   Imaging: DG chest  Consults: Internal medicine  Therapeutics: Aspirin, nitro  Discharge Meds:   Assessment/Plan: 51 year old female presents today with complaints of chest pain.  Patient without personal cardiac history, but chest pain in the past.  Her heart score here is 5 putting her in the moderate category.  Her initial troponin was normal, EKG shows very slight ST depression.  Due to patient's ongoing chest pain despite aspirin and nitroglycerin in the setting of a moderate heart score patient will need observation with ACS rule out.  Request for outside records have been made.    New Prescriptions New Prescriptions   No medications on file     Okey Regal, Hershal Coria 07/09/17 1404    Gareth Morgan, MD 07/12/17 [phone]

## 2017-07-09 NOTE — ED Notes (Signed)
Called pharmacy concerning pt's medications: pt prefers omeprazole 20mg  over protonix . Medication will be changed

## 2017-07-09 NOTE — ED Notes (Signed)
Pt ambulatory to the restroom independently.  

## 2017-07-09 NOTE — H&P (Signed)
Internal Medicine Teaching Service 07/09/2017 Attending Physician: Dr. Lynnae January, Real Cons, MD  Patient Name:  Dawn Reynolds Age: 51 y.o.  DOB: 1966/09/14 Gender: female  PCP: System, Pcp Not In MRN: [id number]  First Contact: Eston Esters, MS Pager: [phone]  After Hours Contact (After 5 PM / Weekends / Holidays) First: [phone] Second: [phone]     Chief Complaint: Chest pain  History of Present Illness: Dawn Reynolds is a 51 y.o. female who presents with left sided chest pain which began last night around 9PM when she was knitting with her step-mother. She reports the pain was localized to her left upper chest and initially she did not have any radiation or systemic symptoms but the pain kept her up at night-estimates she slept ~1hr. She then came to work -works here as an Therapist, sports in the [employer]- and felt diaphoretic, had 2 episodes of diarrhea, felt nauseous, and the pain radiated to her right neck and back/shoulder. She then came to the ED where she was given nitroglycerin which has made her pain better but not completely disappear. Her pain has been constant and intermittently will become sharp. Nothing else has provided relief and nothing has made it worse. She has not been exerting herself - no heavy lifting recently.   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 Prior to surgery had difficulty losing weight; since then has lost ~60lbs over the past year.  Prior History  Allergies: Demerol - Nausea & Vomiting  PMH: Fibromyalgia, HTN, GYN tumor   - Total hysterectomy 1 year ago   - Cath & stress test normal 2.5 years ago   - GERD - omeprazole daily, well controlled   - Fibromyalgia: well controlled with flexeril 10mg  PRN  Family History: +family history for heart disease, father had 2 open heart procedures, CHF, mother had MVP & replacement  Social: in the middle of a divorce. Never smoker. Occasional alcohol but none recently.  Review of Systems Constitutional: Positive for  diaphoresis, nausea, Negative for fever, malaise/fatigue, and weight loss.  HEENT: Negative for blurred vision, hearing loss, sinus pain, congestion, and sore throat. Respiratory: Negative for cough, shortness of breath and wheezing.   Cardiovascular: Positive for chest pain, Negative for palpitations, orthopnea, PND, and leg swelling. Gastrointestinal: Positive for diarrhea; Negative for abdominal pain, blood in stool, constipation, heartburn, nausea and vomiting.  Genitourinary: Negative for dysuria and hematuria.  Musculoskeletal: Negative for joint pain and myalgias.  Neurological: Negative for dizziness, focal weakness, weakness and headaches.   Physical Exam:  BP 119/76   Pulse 68   Temp 98.2 F (36.8 C) (Oral)   Resp 14   SpO2 96%  General Appearance:   Alert, cooperative, no distress, appears stated age  HEENT:   Normocephalic, without obvious abnormality, atraumatic  Lungs:    Clear to auscultation bilaterally, respirations unlabored on room air  Chest wall:   No tenderness or deformity  Heart:   Regular rate and rhythm, S1 and S2 normal  Extremities:  Extremities normal, atraumatic, no cyanosis or edema; 2+ pulses in BLE  Skin:   Skin color, texture, turgor normal, no rashes or lesions  Neurologic:   CNII-XII intact. Normal strength, sensation and reflexes      throughout   Lab results:  Recent Labs Lab 07/09/17 1030  WBC 8.9  HGB 14.5  HCT 42.1  PLT 268  MCV 86.1  MCH 29.7  MCHC 34.4  RDW 14.7   Lab 07/09/17 1030  BUN 26*  CREATININE  0.88  NA 137  K 3.9  CO2 25  CL 103    07/09/17 1042  TROPIPOC 0.00   Imaging results:   XR Chest, 2 View 07/09/17 FINDINGS: Normal heart size and vascularity. Mild chronic bronchitic change centrally and interstitial prominence, nonspecific. No focal pneumonia, collapse or consolidation. External artifact overlies the upper lobes bilaterally. Trachea is midline. No effusion or pneumothorax. Sclerotic bone island in the  proximal right humerus. No acute osseous finding. IMPRESSION: Chronic bronchitic change and slight interstitial prominence, nonspecific. No superimposed pneumonia or CHF  EKG:   Date/Time:  Saturday July 09 2017 10:03:28 EDT  Ventricular Rate:  69  PR Interval:  142  QRS Duration: 92  QT Interval:  390  QTC Calculation: 417  R Axis:   -10  Text Interpretation:  Normal sinus rhythm Slight ST depression inferior leads Abnormal ECG No previous ECGs available.   Assessment & Plan: Dawn Reynolds is a 51 y.o. female who presented with left sided chest pain, ST depression on inferior leads, and a negative troponin. Will admit for 24hr observation/ACS rule out  Chest Pain: continue to monitor troponin & EKGs. If symptoms decrease plan to d/c tomorrow. If any worsening, will consult cardiology   - Nitroglycerin 0.4mg  sulingual q76m PRN chest pain   - Troponin 6qh   - Tomorrow: EKG, Creatinine, CBC, BMP, TSH   - Continue home meds: Amlodipine 10mg  PO qd, Duloxetine 60mg  PO qd, Lovenox 40mg  SQ qd, Omeprazole 20mg  PO qd, Flexeril 10mg  PO qd   This is a Careers information officer Note.  The care of the patient was discussed with Dr. Frederico Hamman and the assessment and plan was formulated with their assistance.  Please see their note for official documentation of the patient encounter.  Signed Eston Esters, MS3 07/09/2017 1:22 PM

## 2017-07-09 NOTE — ED Triage Notes (Signed)
Pt reports onset last night of left side chest pains, sharp in nature. Had diarrhea this am.  Reports having cardiac hx. Denies sob but states her bp was elevated this am. Reports recent high stress levels. no acute distress is noted at triage.

## 2017-07-10 DIAGNOSIS — I10 Essential (primary) hypertension: Secondary | ICD-10-CM | POA: Diagnosis not present

## 2017-07-10 DIAGNOSIS — Z885 Allergy status to narcotic agent status: Secondary | ICD-10-CM | POA: Diagnosis not present

## 2017-07-10 DIAGNOSIS — Z8249 Family history of ischemic heart disease and other diseases of the circulatory system: Secondary | ICD-10-CM | POA: Diagnosis not present

## 2017-07-10 DIAGNOSIS — R079 Chest pain, unspecified: Secondary | ICD-10-CM | POA: Diagnosis not present

## 2017-07-10 DIAGNOSIS — Z733 Stress, not elsewhere classified: Secondary | ICD-10-CM | POA: Diagnosis not present

## 2017-07-10 DIAGNOSIS — Z79899 Other long term (current) drug therapy: Secondary | ICD-10-CM | POA: Diagnosis not present

## 2017-07-10 DIAGNOSIS — M797 Fibromyalgia: Secondary | ICD-10-CM | POA: Diagnosis not present

## 2017-07-10 LAB — BASIC METABOLIC PANEL
ANION GAP: 11 (ref 5–15)
BUN: 22 mg/dL — ABNORMAL HIGH (ref 6–20)
CO2: 23 mmol/L (ref 22–32)
Calcium: 8.8 mg/dL — ABNORMAL LOW (ref 8.9–10.3)
Chloride: 106 mmol/L (ref 101–111)
Creatinine, Ser: 0.9 mg/dL (ref 0.44–1.00)
Glucose, Bld: 120 mg/dL — ABNORMAL HIGH (ref 65–99)
POTASSIUM: 3.5 mmol/L (ref 3.5–5.1)
Sodium: 140 mmol/L (ref 135–145)

## 2017-07-10 LAB — CBC
HEMATOCRIT: 39 % (ref 36.0–46.0)
HEMOGLOBIN: 13.2 g/dL (ref 12.0–15.0)
MCH: 29.1 pg (ref 26.0–34.0)
MCHC: 33.8 g/dL (ref 30.0–36.0)
MCV: 85.9 fL (ref 78.0–100.0)
Platelets: 228 10*3/uL (ref 150–400)
RBC: 4.54 MIL/uL (ref 3.87–5.11)
RDW: 14.7 % (ref 11.5–15.5)
WBC: 8.5 10*3/uL (ref 4.0–10.5)

## 2017-07-10 LAB — HIV ANTIBODY (ROUTINE TESTING W REFLEX): HIV SCREEN 4TH GENERATION: NONREACTIVE

## 2017-07-10 LAB — TROPONIN I

## 2017-07-10 NOTE — Discharge Instructions (Signed)
It was a pleasure to meet you Dawn Reynolds.  It is unclear what is causing your chest pain. The good news is that we did not find evidence of damage to your heart. This may be stress-related. Please try to establish with a primary care physician for follow up. You may continue your current medications without any changes.

## 2017-07-10 NOTE — Discharge Summary (Signed)
Name: Dawn Reynolds MRN: [id number] DOB: 12-Apr-1966 51 y.o. PCP: System, Pcp Not In  Date of Admission: 07/09/2017 10:15 AM Date of Discharge: 07/10/2017 Attending Physician: Bartholomew Crews, MD  Discharge Diagnosis: 1. Atypical Chest Pain   Principal Problem:   Chest pain Active Problems:   Fibromyalgia   Hypertension   Discharge Medications: Allergies as of 07/10/2017      Reactions   Demerol [meperidine] Nausea And Vomiting      Medication List    STOP taking these medications   ipratropium 0.06 % nasal spray Commonly known as:  ATROVENT   ondansetron 4 MG disintegrating tablet Commonly known as:  ZOFRAN ODT     TAKE these medications   amLODipine 10 MG tablet Commonly known as:  NORVASC Take 10 mg by mouth daily.   B COMPLEX 1 PO Take 1 tablet by mouth every morning.   BIOTIN PO Take 1 capsule by mouth daily.   cyclobenzaprine 10 MG tablet Commonly known as:  FLEXERIL Take 1 tablet by mouth daily as needed for muscle spasms.   DULoxetine 60 MG capsule Commonly known as:  CYMBALTA Take 1 capsule by mouth daily.   hydrochlorothiazide 12.5 MG capsule Commonly known as:  MICROZIDE Take 12.5 mg by mouth daily.   ibuprofen 800 MG tablet Commonly known as:  ADVIL,MOTRIN Take 800 mg by mouth every 8 (eight) hours as needed for moderate pain.   meloxicam 15 MG tablet Commonly known as:  MOBIC Take 15 mg by mouth daily.   omeprazole 20 MG capsule Commonly known as:  PRILOSEC Take 20 mg by mouth daily.   OSCAL 500/200 D-3 PO Take 1 capsule by mouth daily.   OVER THE COUNTER MEDICATION Take 1 capsule by mouth daily. Omega red fish oil   Turmeric 500 MG Tabs Take 500 mg by mouth daily.   YUVAFEM 10 MCG Tabs vaginal tablet Generic drug:  Estradiol Place 10 mcg vaginally See admin instructions. Insert 1 vaginally once daily for 14 days then decrease to twice weekly            Discharge Care Instructions        Start     Ordered   07/10/17 0000  Increase activity slowly     07/10/17 0941   07/10/17 0000  Diet - low sodium heart healthy     07/10/17 0941   07/10/17 0000  Call MD for:  temperature >100.4     07/10/17 0941   07/10/17 0000  Call MD for:  persistant nausea and vomiting     07/10/17 0941   07/10/17 0000  Call MD for:  difficulty breathing, headache or visual disturbances     07/10/17 0941   07/10/17 0000  Call MD for:  persistant dizziness or light-headedness     07/10/17 0941   07/10/17 0000  Call MD for:  severe uncontrolled pain     07/10/17 0941      Disposition and follow-up:   Ms.Dawn Reynolds was discharged from [facility] in Stable condition.  At the hospital follow up visit please address:  1.  Please reassess for ongoing chest pain and diaphoresis.   2.  Labs / imaging needed at time of follow-up: None  3.  Pending labs/ test needing follow-up: None   Follow-up Appointments: Please schedule a hospital follow up appointment with your primary care provider.    Hospital Course by problem list:   1. Chest pain: Unclear etiology, but low suspicion for  cardiac etiology. Patient presented with sharp, substernal chest pain associated with diaphoresis, nausea, radiation to R neck, and relief with SL nitro. She was admitted for chest pain r/o due to concern for ACS. Her initial and repeat EKGs did not show signs of ischemia and troponin negative x3. No lab work abnormalities. She remained chest pain free overnight and denied active chest pain on day of discharge. Suspect chest pain can be secondary to anxiety given recent psychosocial stressors described by patient.   2. HTN: well controlled during admission. Patient was continued on home amlodipine 10 mg daily and HCTZ 12.5 mg daily.   3. Fibromyalgia: Patient was continued on home Cymbalta 60 mg daily and Flexeril 10 mg PRN.   4. GERD: Patient was continued on home Prilosec 20 mg daily.   Discharge Vitals:   BP 124/67 (BP  Location: Left Arm)   Pulse 60   Temp 97.8 F (36.6 C) (Oral)   Resp 17   Ht 5\' 6"  (1.676 m)   Wt 233 lb 6.4 oz (105.9 kg)   SpO2 97%   BMI 37.67 kg/m   Pertinent Labs, Studies, and Procedures:  1. Troponin negative x3  2. EKG 9/2: normal SR, nl intervals, no signs of acute ischemia present   3. CXR 9/1:  IMPRESSION: Chronic bronchitic change and slight interstitial prominence, nonspecific. No superimposed pneumonia or CHF   Discharge Instructions: Discharge Instructions    Call MD for:  difficulty breathing, headache or visual disturbances    Complete by:  As directed    Call MD for:  persistant dizziness or light-headedness    Complete by:  As directed    Call MD for:  persistant nausea and vomiting    Complete by:  As directed    Call MD for:  severe uncontrolled pain    Complete by:  As directed    Call MD for:  temperature >100.4    Complete by:  As directed    Diet - low sodium heart healthy    Complete by:  As directed    Increase activity slowly    Complete by:  As directed       Signed: Welford Roche, MD  Internal Medicine PGY-1  P [phone]

## 2017-07-10 NOTE — Progress Notes (Signed)
   Subjective:  No acute events overnight. Patient in good spirits this AM. Denies chest pain, shortness of breath, N/V, and diaphoresis. Explained to patient unclear etiology of chest pain. Patient aware that cardiac workup was normal and high suspicion for anxiety/stress as the cause of her chest pain. She voiced understanding.   Objective:  Vital signs in last 24 hours: Vitals:   07/09/17 1633 07/09/17 2300 07/10/17 0306 07/10/17 0500  BP: 114/71 110/66 113/69   Pulse: 61 70 (!) 59   Resp:  18 17   Temp: 98.1 F (36.7 C) 98.1 F (36.7 C) 98 F (36.7 C)   TempSrc: Oral Oral Oral   SpO2: 94% 99% 93%   Weight: 232 lb 5.8 oz (105.4 kg)   233 lb 6.4 oz (105.9 kg)  Height: 5\' 6"  (1.676 m)      General: pleasant female sleeping in bed in no acute distress  Cardiac: regular rate and rhythm, nl S1/S2, no murmurs, rubs or gallops  Pulm: CTAB, no wheezes or crackles, no increased work of breathing  Abd: soft, NTND, hypoactive bowel sounds Ext: warm and well perfused, no peripheral edema, 2+ DP pulses bilaterally    Assessment/Plan:  Dawn Reynolds is a 51 y.o. female with history of HTN, GERD, and fibromyalgia who presents with substernal, sharp chest pain associated with diaphoresis of 1 day duration concerning for ACS given angina, resolution with SL nitro, mild ST depressions on EKG, history of HTN, and significant family history of heart disease.  Chest pain, atypical: Unclear etiology at this time, but low suspicion for cardiac etiology. Initial and repeat EKG without signs of ischemia and troponin negative x3. Suspect chest pain could be secondary to anxiety given recent psychosocial stressors described by patient. Remained chest pain free overnight without use of SL nitro and denied active chest pain this AM.  - s/p ASA 324mg  and SL nitro x5 - On telemetry  - SL nitro 0.4mg  PRN as needed for pain   Elevated TSH: TSH 4.6 in the setting of acute stress. No further workup at this time.  Low suspicion for hypothyroidism at this time.   HTN: home regimen includes amlodipine 10 mg daily and HCTZ 12.5 mg daily  - Continue home antihypertensive medications    Fibromyalgia:  - Continue home Cymbalta 60 mg daily and Flexeril 10mg  PRN   GERD:  - Continue home Prilosec 20 mg daily   FEN/GI - None  - Replete electrolytes as needed  - HH diet  - GI ppx: continue home Prilosec  - VTE ppx: SQ lovenox  Code status: Full code, confirmed on admission   Dispo: Discharge today 9/2  Welford Roche, MD  Internal Medicine PGY-1  P [phone]

## 2017-07-10 NOTE — Progress Notes (Signed)
  Date: 07/10/2017  Patient name: Dawn Reynolds  Medical record number: [id number]  Date of birth: Oct 03, 1966   I have seen and evaluated Dawn Reynolds and discussed their care with the Residency Team. Ms Dawn Reynolds is a 51 yo female with chief complaint of chest pain. Her past medical history includes hypertension, fibromyalgia, GERD. She had a stress test and cardiac catheterization in [place] in 2016 for pain all over including chest pain which was different than the chest pain leading to this admission. She was in her usual state of health when she developed substernal sharp chest pain while at rest. It was constant and radiated to her right neck and was associated with nausea and diarrhea. She denied dyspnea, vomiting, fever, chills, abdominal pain. She had difficulty sleeping last night due to the pain. She went to work where the pain got worse and she came to the ED and was admitted. Her pain was relieved by nitroglycerin but returned as it wore off. Currently she is pain-free and is ready to go home.  PMHx, Fam Hx, and/or Soc Hx : From [place] originally, Moved to [place], [place] area. Getting divorced. Nurse at [employer].  Vitals:   07/10/17 0306 07/10/17 0803  BP: 113/69 124/67  Pulse: (!) 59 60  Resp: 17   Temp: 98 F (36.7 C) 97.8 F (36.6 C)  SpO2: 93% 97%  NAD HRRR no MRG LCTAB with good air flow ABD + BS, soft Ext no edema, + 2 DP pulses Skin no rash, warm  Trop normal x3 Cr 0.9 K 3.5 HgB 13.2  I personally viewed the CXR images and confirmed my reading with the official read. PA and lateral, good quality, chronic Insterstitial markings  I personally viewed the EKG and confirmed my reading with the official read. Sinus, nl axis, no ischemic changes  Assessment and Plan: I have seen and evaluated the patient as outlined above. I agree with the formulated Assessment and Plan as detailed in the residents' note, with the following changes: Ms Dawn Reynolds is a 51 year old woman with history  of hypertension, GERD, and fibromyalgia. She had a normal cardiac catheterization in 2016 due to chest pain. She presented with atypical chest pain and has ruled out for ACS. The etiology of her pain is not clear that she is medically stable for DC'd to home.  1. Atypical CP - D/C home. F/U PCP.  Bartholomew Crews, MD 9/2/201811:13 AM

## 2017-10-11 MED FILL — AMLODIPINE BESYLATE 10 MG T: 10 | 90 days supply | Qty: 90 | Fill #2

## 2017-10-11 MED FILL — HYDROCHLOROTHIAZIDE 12.5 MG: 12.5 | 3 days supply | Qty: 90 | Fill #2

## 2017-10-11 MED FILL — MELOXICAM 15 MG TABLET: 15 | 90 days supply | Qty: 90 | Fill #2

## 2017-10-11 MED FILL — OMEPRAZOLE 20 MG CAP: 20 | 90 days supply | Qty: 90 | Fill #2

## 2017-10-11 MED FILL — CYCLOBENZAPRINE 10 MG TAB: 10 | 90 days supply | Qty: 90 | Fill #1

## 2017-10-11 MED FILL — DULoxetine HCL 60 MG CPEP: 60 | 90 days supply | Qty: 90 | Fill #2

## 2017-10-21 MED FILL — SULFAMETHOXAZOLE/TMP DS TAB: 800-160 | 7 days supply | Qty: 14 | Fill #0

## 2017-11-23 ENCOUNTER — Telehealth: Payer: Self-pay

## 2017-11-23 NOTE — Telephone Encounter (Signed)
Please advise 

## 2017-11-23 NOTE — Telephone Encounter (Signed)
Copied from [place] [id number]. Topic: Appointment Scheduling - Scheduling Inquiry for Clinic >> Nov 23, 2017  3:29 PM Dawn Reynolds, [place] C wrote: Reason for CRM: pt says that she was referred to provider Penni Homans by one of Dr. Sharmaine Base current patients [place], pt would like to know if provider would take her on as a new patient?    Please advise.    CB: [phone]

## 2017-11-23 NOTE — Telephone Encounter (Signed)
I am afraid I cannot take on another new patient right now. Please offer her other provider in office at the present time.

## 2017-11-24 MED FILL — CEPHALEXIN 500 MG CAPSULE: 500 | 5 days supply | Qty: 15 | Fill #0

## 2017-11-24 NOTE — Telephone Encounter (Signed)
Left message for patient to call and pick another provider in the office

## 2017-12-21 DIAGNOSIS — M170 Bilateral primary osteoarthritis of knee: Secondary | ICD-10-CM | POA: Diagnosis not present

## 2017-12-21 DIAGNOSIS — M1712 Unilateral primary osteoarthritis, left knee: Secondary | ICD-10-CM | POA: Insufficient documentation

## 2017-12-21 DIAGNOSIS — M1711 Unilateral primary osteoarthritis, right knee: Secondary | ICD-10-CM | POA: Diagnosis not present

## 2018-01-27 MED FILL — DULoxetine HCL 60 MG CPEP: 60 | 90 days supply | Qty: 90 | Fill #3

## 2018-01-27 MED FILL — MELOXICAM 15 MG TABLET: 15 | 90 days supply | Qty: 90 | Fill #3

## 2018-01-27 MED FILL — AMLODIPINE BESYLATE 10 MG T: 10 | 90 days supply | Qty: 90 | Fill #3

## 2018-01-27 MED FILL — HYDROCHLOROTHIAZIDE 12.5 MG: 12.5 | 3 days supply | Qty: 90 | Fill #3

## 2018-01-27 MED FILL — OMEPRAZOLE 20 MG CAP: 20 | 90 days supply | Qty: 90 | Fill #3

## 2018-02-20 ENCOUNTER — Other Ambulatory Visit: Payer: Self-pay

## 2018-02-20 ENCOUNTER — Encounter (HOSPITAL_COMMUNITY): Payer: Self-pay | Admitting: Emergency Medicine

## 2018-02-20 ENCOUNTER — Emergency Department (HOSPITAL_COMMUNITY)
Admission: EM | Admit: 2018-02-20 | Discharge: 2018-02-21 | Discharge status: Against Medical Advice | Payer: 59 | Attending: Emergency Medicine | Admitting: Emergency Medicine

## 2018-02-20 ENCOUNTER — Emergency Department (HOSPITAL_COMMUNITY): Payer: 59

## 2018-02-20 DIAGNOSIS — R9431 Abnormal electrocardiogram [ECG] [EKG]: Secondary | ICD-10-CM | POA: Diagnosis not present

## 2018-02-20 DIAGNOSIS — R1011 Right upper quadrant pain: Secondary | ICD-10-CM | POA: Insufficient documentation

## 2018-02-20 LAB — CBC
HCT: 42.2 % (ref 36.0–46.0)
Hemoglobin: 14 g/dL (ref 12.0–15.0)
MCH: 28.2 pg (ref 26.0–34.0)
MCHC: 33.2 g/dL (ref 30.0–36.0)
MCV: 84.9 fL (ref 78.0–100.0)
PLATELETS: 260 10*3/uL (ref 150–400)
RBC: 4.97 MIL/uL (ref 3.87–5.11)
RDW: 16.3 % — AB (ref 11.5–15.5)
WBC: 8.9 10*3/uL (ref 4.0–10.5)

## 2018-02-20 LAB — URINALYSIS, ROUTINE W REFLEX MICROSCOPIC
Bilirubin Urine: NEGATIVE
GLUCOSE, UA: NEGATIVE mg/dL
KETONES UR: NEGATIVE mg/dL
Nitrite: NEGATIVE
PH: 5 (ref 5.0–8.0)
Protein, ur: NEGATIVE mg/dL
SPECIFIC GRAVITY, URINE: 1.021 (ref 1.005–1.030)

## 2018-02-20 LAB — LIPASE, BLOOD: Lipase: 37 U/L (ref 11–51)

## 2018-02-20 LAB — I-STAT BETA HCG BLOOD, ED (MC, WL, AP ONLY): I-stat hCG, quantitative: <5 m[IU]/mL (ref <5)

## 2018-02-20 LAB — COMPREHENSIVE METABOLIC PANEL
ALK PHOS: 75 U/L (ref 38–126)
ALT: 19 U/L (ref 14–54)
AST: 22 U/L (ref 15–41)
Albumin: 4 g/dL (ref 3.5–5.0)
Anion gap: 13 (ref 5–15)
BUN: 18 mg/dL (ref 6–20)
CALCIUM: 9.2 mg/dL (ref 8.9–10.3)
CO2: 21 mmol/L — AB (ref 22–32)
CREATININE: 0.88 mg/dL (ref 0.44–1.00)
Chloride: 106 mmol/L (ref 101–111)
Glucose, Bld: 106 mg/dL — ABNORMAL HIGH (ref 65–99)
Potassium: 3.4 mmol/L — ABNORMAL LOW (ref 3.5–5.1)
SODIUM: 140 mmol/L (ref 135–145)
Total Bilirubin: 1 mg/dL (ref 0.3–1.2)
Total Protein: 7.3 g/dL (ref 6.5–8.1)

## 2018-02-20 MED ORDER — KETOROLAC TROMETHAMINE 30 MG/ML IJ SOLN: 30.0000 mg | Freq: Once | INTRAMUSCULAR | Status: DC

## 2018-02-20 MED ORDER — FENTANYL CITRATE (PF) 100 MCG/2ML IJ SOLN: 50.0000 ug | Freq: Once | INTRAMUSCULAR | Status: DC

## 2018-02-20 MED ORDER — ONDANSETRON 4 MG PO TBDP
4.0000 mg | ORAL_TABLET | Freq: Once | ORAL | Status: DC
Start: 2018-02-20 — End: 2018-02-21

## 2018-02-20 NOTE — ED Provider Notes (Cosign Needed)
Patient placed in Quick Look pathway, seen and evaluated   Chief Complaint: RUQ pain  HPI:   Patient presents with sudden onset sharp right upper quadrant pain radiating to the right shoulder after eating truffle fries prior to arrival.   ROS: associated nausea, no vomiting or diarrhea, no fever  Physical Exam:   Gen: No distress  Neuro: Awake and Alert  Skin: Warm    Focused Exam: afebrile nontoxic, RUQ tenderness   Initiation of care has begun. The patient has been counseled on the process, plan, and necessity for staying for the completion/evaluation, and the remainder of the medical screening examination    Dossie Der 02/20/18 2002

## 2018-03-07 ENCOUNTER — Ambulatory Visit: Payer: Self-pay | Admitting: General Surgery

## 2018-03-07 DIAGNOSIS — K8020 Calculus of gallbladder without cholecystitis without obstruction: Secondary | ICD-10-CM | POA: Diagnosis not present

## 2018-03-07 NOTE — H&P (Signed)
History of Present Illness Dawn Ok MD; 03/07/2018 2:31 PM) The patient is a 52 year old female who presents for evaluation of gall stones. Referred by: Dr. Pattricia Boss Chief Complaint: Abdominal pain  Patient is a 52 year old female who 2 weeks ago was in the ER secondary to epigastric/right upper quadrant abdominal pain. She states this began after dinner. She states that she do an ultrasound did reveal gallstones. I did repeat this scan personally. Patient's laboratory values were within normal limits. Patient states that thinking back she has noticed some right abdominal pain after eating. She states that recently she's been staying on a low-fat fat/carb diet has helped some. She does have some occasional abdominal pain.  Patient states that she's had previous abdominoplasty versus liposuction, is unsure currently. Otherwise patient had a hysterectomy laparoscopically.    Past Surgical History Erline Levine, RN; 03/07/2018 2:20 PM) Hysterectomy (due to cancer) - Complete  Tonsillectomy   Diagnostic Studies History Erline Levine, RN; 03/07/2018 2:20 PM) Colonoscopy  >10 years ago Mammogram  1-3 years ago Pap Smear  1-5 years ago  Allergies Erline Levine, RN; 03/07/2018 2:23 PM) Demerol *ANALGESICS - OPIOID*   Medication History Erline Levine, RN; 03/07/2018 2:27 PM) AmLODIPine Besylate (10MG  Tablet, Oral) Active. Cyclobenzaprine HCl (10MG  Tablet, Oral) Active. DULoxetine HCl (60MG  Capsule DR Part, Oral) Active. HydroCHLOROthiazide (12.5MG  Capsule, Oral) Active. Meloxicam (15MG  Tablet, Oral) Active. Omeprazole (20MG  Capsule DR, Oral) Active. Vitamin B Complex (Oral) Active. Vitamin D (1000UNIT Tablet, Oral) Active. Flax Seed Oil (1000MG  Capsule, Oral) Active. Biotin (1MG  Capsule, Oral) Active. Vitamin C (500MG  Tablet, Oral) Active. Medications Reconciled  Social History Erline Levine, RN; 03/07/2018 2:20 PM) Alcohol use  Occasional alcohol use. No  drug use  Tobacco use  Never smoker.  Family History Erline Levine, RN; 03/07/2018 2:20 PM) Bleeding disorder  Father. Colon Polyps  Father. Depression  Brother, Father, Mother. Diabetes Mellitus  Father. Heart Disease  Father, Mother. Heart disease in female family member before age 65  Heart disease in female family member before age 31  Hypertension  Brother, Father. Ischemic Bowel Disease  Brother. Kidney Disease  Father. Malignant Neoplasm Of Pancreas  Family Members In General. Melanoma  Brother. Prostate Cancer  Family Members In General, Father. Thyroid problems  Brother, Family Members In General.  Pregnancy / Birth History Erline Levine, RN; 03/07/2018 2:20 PM) Age at menarche  62 years. Age of menopause  24-50 Gravida  4 Length (months) of breastfeeding  7-12 Maternal age  68-30 Para  2  Other Problems Erline Levine, RN; 03/07/2018 2:20 PM) Arthritis  Back Pain  Cholelithiasis  Gastroesophageal Reflux Disease  High blood pressure  Kidney Stone  Migraine Headache  Other disease, cancer, significant illness     Review of Systems Dawn Ok MD; 03/07/2018 2:29 PM) General Present- Feeling well. Not Present- Fever. HEENT Present- Wears glasses/contact lenses. Not Present- Earache, Hearing Loss, Hoarseness, Nose Bleed, Oral Ulcers, Ringing in the Ears, Seasonal Allergies, Sinus Pain, Sore Throat, Visual Disturbances and Yellow Eyes. Respiratory Not Present- Cough and Difficulty Breathing. Cardiovascular Not Present- Chest Pain. Gastrointestinal Present- Abdominal Pain and Nausea. Not Present- Bloating, Bloody Stool, Change in Bowel Habits, Chronic diarrhea, Constipation, Difficulty Swallowing, Excessive gas, Gets full quickly at meals, Hemorrhoids, Indigestion, Rectal Pain and Vomiting. Musculoskeletal Present- Joint Pain and Muscle Pain. Not Present- Back Pain, Joint Stiffness, Muscle Weakness and Swelling of Extremities. Neurological  Not Present- Weakness. All other systems negative  Vitals Erline Levine RN; 03/07/2018 2:28 PM) 03/07/2018 2:27 PM Weight:  234.25 lb Height: 66in Body Surface Area: 2.14 m Body Mass Index: 37.81 kg/m  Temp.: 98.56F(Oral)  Pulse: 88 (Regular)  P.OX: 98% (Room air) BP: 138/80 (Sitting, Left Arm, Standard)       Physical Exam Dawn Ok MD; 03/07/2018 2:31 PM) The physical exam findings are as follows: Note:Constitutional: No acute distress, conversant, appears stated age  Eyes: Anicteric sclerae, moist conjunctiva, no lid lag  Neck: No thyromegaly, trachea midline, no cervical lymphadenopathy  Lungs: Clear to auscultation biilaterally, normal respiratory effot  Cardiovascular: regular rate & rhythm, no murmurs, no peripheal edema, pedal pulses 2+  GI: Soft, no masses or hepatosplenomegaly, non-tender to palpation  MSK: Normal gait, no clubbing cyanosis, edema  Skin: No rashes, palpation reveals normal skin turgor  Psychiatric: Appropriate judgment and insight, oriented to person, place, and time    Assessment & Plan Dawn Ok MD; 03/07/2018 2:31 PM) SYMPTOMATIC CHOLELITHIASIS (K80.20) Impression: 52 year old female symptomatic cholelithiasis  1. We will proceed to the operating room for a laparoscopic cholecystectomy  2. Risks and benefits were discussed with the patient to generally include, but not limited to: infection, bleeding, possible need for post op ERCP, damage to the bile ducts, bile leak, and possible need for further surgery. Alternatives were offered and described. All questions were answered and the patient voiced understanding of the procedure and wishes to proceed at this point with a laparoscopic cholecystectomy

## 2018-03-07 NOTE — H&P (View-Only) (Signed)
History of Present Illness Ralene Ok MD; 03/07/2018 2:31 PM) The patient is a 52 year old female who presents for evaluation of gall stones. Referred by: Dr. Pattricia Boss Chief Complaint: Abdominal pain  Patient is a 52 year old female who 2 weeks ago was in the ER secondary to epigastric/right upper quadrant abdominal pain. She states this began after dinner. She states that she do an ultrasound did reveal gallstones. I did repeat this scan personally. Patient's laboratory values were within normal limits. Patient states that thinking back she has noticed some right abdominal pain after eating. She states that recently she's been staying on a low-fat fat/carb diet has helped some. She does have some occasional abdominal pain.  Patient states that she's had previous abdominoplasty versus liposuction, is unsure currently. Otherwise patient had a hysterectomy laparoscopically.    Past Surgical History Erline Levine, RN; 03/07/2018 2:20 PM) Hysterectomy (due to cancer) - Complete  Tonsillectomy   Diagnostic Studies History Erline Levine, RN; 03/07/2018 2:20 PM) Colonoscopy  >10 years ago Mammogram  1-3 years ago Pap Smear  1-5 years ago  Allergies Erline Levine, RN; 03/07/2018 2:23 PM) Demerol *ANALGESICS - OPIOID*   Medication History Erline Levine, RN; 03/07/2018 2:27 PM) AmLODIPine Besylate (10MG  Tablet, Oral) Active. Cyclobenzaprine HCl (10MG  Tablet, Oral) Active. DULoxetine HCl (60MG  Capsule DR Part, Oral) Active. HydroCHLOROthiazide (12.5MG  Capsule, Oral) Active. Meloxicam (15MG  Tablet, Oral) Active. Omeprazole (20MG  Capsule DR, Oral) Active. Vitamin B Complex (Oral) Active. Vitamin D (1000UNIT Tablet, Oral) Active. Flax Seed Oil (1000MG  Capsule, Oral) Active. Biotin (1MG  Capsule, Oral) Active. Vitamin C (500MG  Tablet, Oral) Active. Medications Reconciled  Social History Erline Levine, RN; 03/07/2018 2:20 PM) Alcohol use  Occasional alcohol use. No  drug use  Tobacco use  Never smoker.  Family History Erline Levine, RN; 03/07/2018 2:20 PM) Bleeding disorder  Father. Colon Polyps  Father. Depression  Brother, Father, Mother. Diabetes Mellitus  Father. Heart Disease  Father, Mother. Heart disease in female family member before age 41  Heart disease in female family member before age 71  Hypertension  Brother, Father. Ischemic Bowel Disease  Brother. Kidney Disease  Father. Malignant Neoplasm Of Pancreas  Family Members In General. Melanoma  Brother. Prostate Cancer  Family Members In General, Father. Thyroid problems  Brother, Family Members In General.  Pregnancy / Birth History Erline Levine, RN; 03/07/2018 2:20 PM) Age at menarche  35 years. Age of menopause  47-50 Gravida  4 Length (months) of breastfeeding  7-12 Maternal age  76-30 Para  2  Other Problems Erline Levine, RN; 03/07/2018 2:20 PM) Arthritis  Back Pain  Cholelithiasis  Gastroesophageal Reflux Disease  High blood pressure  Kidney Stone  Migraine Headache  Other disease, cancer, significant illness     Review of Systems Ralene Ok MD; 03/07/2018 2:29 PM) General Present- Feeling well. Not Present- Fever. HEENT Present- Wears glasses/contact lenses. Not Present- Earache, Hearing Loss, Hoarseness, Nose Bleed, Oral Ulcers, Ringing in the Ears, Seasonal Allergies, Sinus Pain, Sore Throat, Visual Disturbances and Yellow Eyes. Respiratory Not Present- Cough and Difficulty Breathing. Cardiovascular Not Present- Chest Pain. Gastrointestinal Present- Abdominal Pain and Nausea. Not Present- Bloating, Bloody Stool, Change in Bowel Habits, Chronic diarrhea, Constipation, Difficulty Swallowing, Excessive gas, Gets full quickly at meals, Hemorrhoids, Indigestion, Rectal Pain and Vomiting. Musculoskeletal Present- Joint Pain and Muscle Pain. Not Present- Back Pain, Joint Stiffness, Muscle Weakness and Swelling of Extremities. Neurological  Not Present- Weakness. All other systems negative  Vitals Erline Levine RN; 03/07/2018 2:28 PM) 03/07/2018 2:27 PM Weight:  234.25 lb Height: 66in Body Surface Area: 2.14 m Body Mass Index: 37.81 kg/m  Temp.: 98.74F(Oral)  Pulse: 88 (Regular)  P.OX: 98% (Room air) BP: 138/80 (Sitting, Left Arm, Standard)       Physical Exam Ralene Ok MD; 03/07/2018 2:31 PM) The physical exam findings are as follows: Note:Constitutional: No acute distress, conversant, appears stated age  Eyes: Anicteric sclerae, moist conjunctiva, no lid lag  Neck: No thyromegaly, trachea midline, no cervical lymphadenopathy  Lungs: Clear to auscultation biilaterally, normal respiratory effot  Cardiovascular: regular rate & rhythm, no murmurs, no peripheal edema, pedal pulses 2+  GI: Soft, no masses or hepatosplenomegaly, non-tender to palpation  MSK: Normal gait, no clubbing cyanosis, edema  Skin: No rashes, palpation reveals normal skin turgor  Psychiatric: Appropriate judgment and insight, oriented to person, place, and time    Assessment & Plan Ralene Ok MD; 03/07/2018 2:31 PM) SYMPTOMATIC CHOLELITHIASIS (K80.20) Impression: 52 year old female symptomatic cholelithiasis  1. We will proceed to the operating room for a laparoscopic cholecystectomy  2. Risks and benefits were discussed with the patient to generally include, but not limited to: infection, bleeding, possible need for post op ERCP, damage to the bile ducts, bile leak, and possible need for further surgery. Alternatives were offered and described. All questions were answered and the patient voiced understanding of the procedure and wishes to proceed at this point with a laparoscopic cholecystectomy

## 2018-03-15 NOTE — Patient Instructions (Signed)
Dawn Reynolds  03/15/2018   Your procedure is scheduled on: 03-31-18  Report to [facility] Main  Entrance              Report to admitting at       1000 AM    Call this number if you have problems the morning of surgery [phone]    Remember: Do not eat food or drink liquids :After Midnight.     Take these medicines the morning of surgery with A SIP OF WATER: omeprazole, cymbalta, amlodipine                                You may not have any metal on your body including hair pins and              piercings  Do not wear jewelry, make-up, lotions, powders or perfumes, deodorant             Do not wear nail polish.  Do not shave  48 hours prior to surgery.     Do not bring valuables to the hospital. [place].  Contacts, dentures or bridgework may not be worn into surgery.  Leave suitcase in the car. After surgery it may be brought to your room.                 Please read over the following fact sheets you were given: _____________________________________________________________________          [facility] - Preparing for Surgery Before surgery, you can play an important role.  Because skin is not sterile, your skin needs to be as free of germs as possible.  You can reduce the number of germs on your skin by washing with CHG (chlorahexidine gluconate) soap before surgery.  CHG is an antiseptic cleaner which kills germs and bonds with the skin to continue killing germs even after washing. Please DO NOT use if you have an allergy to CHG or antibacterial soaps.  If your skin becomes reddened/irritated stop using the CHG and inform your nurse when you arrive at Short Stay. Do not shave (including legs and underarms) for at least 48 hours prior to the first CHG shower.  You may shave your face/neck. Please follow these instructions carefully:  1.  Shower with CHG Soap the night before surgery and the  morning of  Surgery.  2.  If you choose to wash your hair, wash your hair first as usual with your  normal  shampoo.  3.  After you shampoo, rinse your hair and body thoroughly to remove the  shampoo.                           4.  Use CHG as you would any other liquid soap.  You can apply chg directly  to the skin and wash                       Gently with a scrungie or clean washcloth.  5.  Apply the CHG Soap to your body ONLY FROM THE NECK DOWN.   Do not use on face/ open  Wound or open sores. Avoid contact with eyes, ears mouth and genitals (private parts).                       Wash face,  Genitals (private parts) with your normal soap.             6.  Wash thoroughly, paying special attention to the area where your surgery  will be performed.  7.  Thoroughly rinse your body with warm water from the neck down.  8.  DO NOT shower/wash with your normal soap after using and rinsing off  the CHG Soap.                9.  Pat yourself dry with a clean towel.            10.  Wear clean pajamas.            11.  Place clean sheets on your bed the night of your first shower and do not  sleep with pets. Day of Surgery : Do not apply any lotions/deodorants the morning of surgery.  Please wear clean clothes to the hospital/surgery center.  FAILURE TO FOLLOW THESE INSTRUCTIONS MAY RESULT IN THE CANCELLATION OF YOUR SURGERY PATIENT SIGNATURE_________________________________  NURSE SIGNATURE__________________________________  ________________________________________________________________________

## 2018-03-21 ENCOUNTER — Other Ambulatory Visit: Payer: Self-pay

## 2018-03-21 ENCOUNTER — Encounter (HOSPITAL_COMMUNITY): Payer: Self-pay

## 2018-03-21 ENCOUNTER — Encounter (HOSPITAL_COMMUNITY)
Admission: RE | Admit: 2018-03-21 | Discharge: 2018-03-21 | Disposition: A | Discharge status: Home / Self Care | Payer: 59 | Source: Ambulatory Visit | Attending: General Surgery | Admitting: General Surgery

## 2018-03-21 DIAGNOSIS — K8080 Other cholelithiasis without obstruction: Secondary | ICD-10-CM | POA: Diagnosis not present

## 2018-03-21 DIAGNOSIS — Z01812 Encounter for preprocedural laboratory examination: Secondary | ICD-10-CM | POA: Insufficient documentation

## 2018-03-21 HISTORY — DX: Personal history of urinary calculi: Z87.442

## 2018-03-21 HISTORY — DX: Major depressive disorder, single episode, unspecified: F32.9

## 2018-03-21 HISTORY — DX: Depression, unspecified: F32.A

## 2018-03-21 HISTORY — DX: Headache: R51

## 2018-03-21 HISTORY — DX: Anemia, unspecified: D64.9

## 2018-03-21 HISTORY — DX: Headache, unspecified: R51.9

## 2018-03-21 LAB — BASIC METABOLIC PANEL
ANION GAP: 10 (ref 5–15)
BUN: 20 mg/dL (ref 6–20)
CHLORIDE: 104 mmol/L (ref 101–111)
CO2: 25 mmol/L (ref 22–32)
Calcium: 9 mg/dL (ref 8.9–10.3)
Creatinine, Ser: 0.82 mg/dL (ref 0.44–1.00)
GFR calc Af Amer: >60 mL/min (ref >60)
Glucose, Bld: 124 mg/dL — ABNORMAL HIGH (ref 65–99)
POTASSIUM: 3.3 mmol/L — AB (ref 3.5–5.1)
SODIUM: 139 mmol/L (ref 135–145)

## 2018-03-21 LAB — CBC
HEMATOCRIT: 42.5 % (ref 36.0–46.0)
HEMOGLOBIN: 14 g/dL (ref 12.0–15.0)
MCH: 28.7 pg (ref 26.0–34.0)
MCHC: 32.9 g/dL (ref 30.0–36.0)
MCV: 87.3 fL (ref 78.0–100.0)
Platelets: 276 10*3/uL (ref 150–400)
RBC: 4.87 MIL/uL (ref 3.87–5.11)
RDW: 15.7 % — ABNORMAL HIGH (ref 11.5–15.5)
WBC: 7 10*3/uL (ref 4.0–10.5)

## 2018-03-21 NOTE — Progress Notes (Signed)
ekg 02-21-18 epic  cxr 07-09-17 epic

## 2018-03-30 ENCOUNTER — Telehealth (HOSPITAL_COMMUNITY): Payer: Self-pay | Admitting: *Deleted

## 2018-03-31 ENCOUNTER — Other Ambulatory Visit: Payer: Self-pay

## 2018-03-31 ENCOUNTER — Encounter (HOSPITAL_COMMUNITY): Payer: Self-pay | Admitting: Certified Registered Nurse Anesthetist

## 2018-03-31 ENCOUNTER — Ambulatory Visit (HOSPITAL_COMMUNITY)
Admission: RE | Admit: 2018-03-31 | Discharge: 2018-03-31 | Disposition: A | Discharge status: Home / Self Care | Payer: 59 | Source: Other Acute Inpatient Hospital | Attending: General Surgery | Admitting: General Surgery

## 2018-03-31 ENCOUNTER — Ambulatory Visit (HOSPITAL_COMMUNITY): Payer: 59 | Admitting: Certified Registered Nurse Anesthetist

## 2018-03-31 ENCOUNTER — Encounter (HOSPITAL_COMMUNITY)
Admission: RE | Disposition: A | Discharge status: Home / Self Care | Payer: Self-pay | Source: Other Acute Inpatient Hospital | Attending: General Surgery

## 2018-03-31 DIAGNOSIS — Z791 Long term (current) use of non-steroidal anti-inflammatories (NSAID): Secondary | ICD-10-CM | POA: Insufficient documentation

## 2018-03-31 DIAGNOSIS — Z79899 Other long term (current) drug therapy: Secondary | ICD-10-CM | POA: Diagnosis not present

## 2018-03-31 DIAGNOSIS — K8010 Calculus of gallbladder with chronic cholecystitis without obstruction: Secondary | ICD-10-CM | POA: Insufficient documentation

## 2018-03-31 DIAGNOSIS — I10 Essential (primary) hypertension: Secondary | ICD-10-CM | POA: Diagnosis not present

## 2018-03-31 DIAGNOSIS — F329 Major depressive disorder, single episode, unspecified: Secondary | ICD-10-CM | POA: Diagnosis not present

## 2018-03-31 DIAGNOSIS — M797 Fibromyalgia: Secondary | ICD-10-CM | POA: Insufficient documentation

## 2018-03-31 DIAGNOSIS — K219 Gastro-esophageal reflux disease without esophagitis: Secondary | ICD-10-CM | POA: Insufficient documentation

## 2018-03-31 DIAGNOSIS — K8020 Calculus of gallbladder without cholecystitis without obstruction: Secondary | ICD-10-CM | POA: Diagnosis not present

## 2018-03-31 HISTORY — PX: CHOLECYSTECTOMY: SHX55

## 2018-03-31 HISTORY — PX: GALLBLADDER SURGERY: SHX652

## 2018-03-31 SURGERY — LAPAROSCOPIC CHOLECYSTECTOMY
Anesthesia: General | Site: Abdomen

## 2018-03-31 MED ORDER — PROMETHAZINE HCL 25 MG/ML IJ SOLN: 6.2500 mg | INTRAMUSCULAR | Status: DC | PRN

## 2018-03-31 MED ORDER — KETOROLAC TROMETHAMINE 30 MG/ML IJ SOLN
INTRAMUSCULAR | Status: AC
  Filled 2018-03-31: qty 1

## 2018-03-31 MED ORDER — MIDAZOLAM HCL 2 MG/2ML IJ SOLN
INTRAMUSCULAR | Status: AC
  Filled 2018-03-31: qty 2

## 2018-03-31 MED ORDER — ONDANSETRON HCL 4 MG/2ML IJ SOLN
INTRAMUSCULAR | Status: DC | PRN
  Administered 2018-03-31: 4 mg via INTRAVENOUS

## 2018-03-31 MED ORDER — ONDANSETRON HCL 4 MG/2ML IJ SOLN
INTRAMUSCULAR | Status: AC
  Filled 2018-03-31: qty 2

## 2018-03-31 MED ORDER — MIDAZOLAM HCL 5 MG/5ML IJ SOLN
INTRAMUSCULAR | Status: DC | PRN
  Administered 2018-03-31: 2 mg via INTRAVENOUS

## 2018-03-31 MED ORDER — OXYCODONE HCL 5 MG PO TABS
5.0000 mg | ORAL_TABLET | Freq: Once | ORAL | Status: AC | PRN
  Administered 2018-03-31: 5 mg via ORAL

## 2018-03-31 MED ORDER — LACTATED RINGERS IV SOLN
INTRAVENOUS | Status: AC | PRN
  Administered 2018-03-31: 1000 mL

## 2018-03-31 MED ORDER — GABAPENTIN 300 MG PO CAPS
300.0000 mg | ORAL_CAPSULE | ORAL | Status: AC
  Administered 2018-03-31: 300 mg via ORAL
  Filled 2018-03-31: qty 1

## 2018-03-31 MED ORDER — ROCURONIUM BROMIDE 10 MG/ML (PF) SYRINGE
PREFILLED_SYRINGE | INTRAVENOUS | Status: DC | PRN
  Administered 2018-03-31: 50 mg via INTRAVENOUS

## 2018-03-31 MED ORDER — SUGAMMADEX SODIUM 200 MG/2ML IV SOLN
INTRAVENOUS | Status: AC
  Filled 2018-03-31: qty 2

## 2018-03-31 MED ORDER — CIPROFLOXACIN IN D5W 400 MG/200ML IV SOLN
INTRAVENOUS | Status: DC | PRN
  Administered 2018-03-31: 400 mg via INTRAVENOUS

## 2018-03-31 MED ORDER — TRAMADOL HCL 50 MG PO TABS: 50.0000 mg | ORAL_TABLET | Freq: Four times a day (QID) | ORAL | 0 refills | Status: DC | PRN

## 2018-03-31 MED ORDER — ROCURONIUM BROMIDE 10 MG/ML (PF) SYRINGE
PREFILLED_SYRINGE | INTRAVENOUS | Status: AC
  Filled 2018-03-31: qty 5

## 2018-03-31 MED ORDER — CIPROFLOXACIN IN D5W 400 MG/200ML IV SOLN
INTRAVENOUS | Status: AC
  Filled 2018-03-31: qty 200

## 2018-03-31 MED ORDER — DEXAMETHASONE SODIUM PHOSPHATE 10 MG/ML IJ SOLN
INTRAMUSCULAR | Status: DC | PRN
  Administered 2018-03-31: 10 mg via INTRAVENOUS

## 2018-03-31 MED ORDER — LIDOCAINE 2% (20 MG/ML) 5 ML SYRINGE
INTRAMUSCULAR | Status: AC
  Filled 2018-03-31: qty 5

## 2018-03-31 MED ORDER — SUGAMMADEX SODIUM 200 MG/2ML IV SOLN
INTRAVENOUS | Status: DC | PRN
  Administered 2018-03-31: 200 mg via INTRAVENOUS

## 2018-03-31 MED ORDER — GLYCOPYRROLATE 0.2 MG/ML IV SOSY
PREFILLED_SYRINGE | INTRAVENOUS | Status: DC | PRN
  Administered 2018-03-31: .2 mg via INTRAVENOUS

## 2018-03-31 MED ORDER — DEXAMETHASONE SODIUM PHOSPHATE 10 MG/ML IJ SOLN
INTRAMUSCULAR | Status: AC
  Filled 2018-03-31: qty 1

## 2018-03-31 MED ORDER — KETOROLAC TROMETHAMINE 30 MG/ML IJ SOLN
INTRAMUSCULAR | Status: DC | PRN
  Administered 2018-03-31: 30 mg via INTRAVENOUS

## 2018-03-31 MED ORDER — PROPOFOL 10 MG/ML IV BOLUS
INTRAVENOUS | Status: DC | PRN
  Administered 2018-03-31: 200 mg via INTRAVENOUS

## 2018-03-31 MED ORDER — ACETAMINOPHEN 500 MG PO TABS
1000.0000 mg | ORAL_TABLET | ORAL | Status: AC
  Administered 2018-03-31: 1000 mg via ORAL
  Filled 2018-03-31: qty 2

## 2018-03-31 MED ORDER — HYDROMORPHONE HCL 1 MG/ML IJ SOLN
0.2500 mg | INTRAMUSCULAR | Status: DC | PRN
  Administered 2018-03-31 (×3): 0.5 mg via INTRAVENOUS

## 2018-03-31 MED ORDER — HYDROMORPHONE HCL 1 MG/ML IJ SOLN
INTRAMUSCULAR | Status: AC
  Administered 2018-03-31: 0.5 mg via INTRAVENOUS
  Filled 2018-03-31: qty 1

## 2018-03-31 MED ORDER — BUPIVACAINE-EPINEPHRINE (PF) 0.25% -1:200000 IJ SOLN
INTRAMUSCULAR | Status: AC
  Filled 2018-03-31: qty 30

## 2018-03-31 MED ORDER — OXYCODONE HCL 5 MG/5ML PO SOLN
5.0000 mg | Freq: Once | ORAL | Status: AC | PRN
  Filled 2018-03-31: qty 5

## 2018-03-31 MED ORDER — CEFAZOLIN SODIUM-DEXTROSE 2-4 GM/100ML-% IV SOLN
2.0000 g | INTRAVENOUS | Status: AC
  Administered 2018-03-31: 2 g via INTRAVENOUS
  Filled 2018-03-31: qty 100

## 2018-03-31 MED ORDER — CHLORHEXIDINE GLUCONATE CLOTH 2 % EX PADS: 6.0000 | MEDICATED_PAD | Freq: Once | CUTANEOUS | Status: DC

## 2018-03-31 MED ORDER — SCOPOLAMINE 1 MG/3DAYS TD PT72
MEDICATED_PATCH | TRANSDERMAL | Status: AC
  Filled 2018-03-31: qty 1

## 2018-03-31 MED ORDER — LIDOCAINE 2% (20 MG/ML) 5 ML SYRINGE
INTRAMUSCULAR | Status: DC | PRN
  Administered 2018-03-31: 100 mg via INTRAVENOUS

## 2018-03-31 MED ORDER — HYDROMORPHONE HCL 1 MG/ML IJ SOLN
INTRAMUSCULAR | Status: AC
  Filled 2018-03-31: qty 1

## 2018-03-31 MED ORDER — PROPOFOL 10 MG/ML IV BOLUS
INTRAVENOUS | Status: AC
  Filled 2018-03-31: qty 20

## 2018-03-31 MED ORDER — OXYCODONE HCL 5 MG PO TABS
ORAL_TABLET | ORAL | Status: AC
  Filled 2018-03-31: qty 1

## 2018-03-31 MED ORDER — SCOPOLAMINE 1 MG/3DAYS TD PT72
1.0000 | MEDICATED_PATCH | TRANSDERMAL | Status: DC
  Administered 2018-03-31: 1.5 mg via TRANSDERMAL

## 2018-03-31 MED ORDER — FENTANYL CITRATE (PF) 250 MCG/5ML IJ SOLN
INTRAMUSCULAR | Status: DC | PRN
  Administered 2018-03-31: 50 ug via INTRAVENOUS
  Administered 2018-03-31: 100 ug via INTRAVENOUS

## 2018-03-31 MED ORDER — CELECOXIB 200 MG PO CAPS
200.0000 mg | ORAL_CAPSULE | ORAL | Status: AC
  Administered 2018-03-31: 200 mg via ORAL
  Filled 2018-03-31: qty 1

## 2018-03-31 MED ORDER — BUPIVACAINE-EPINEPHRINE 0.25% -1:200000 IJ SOLN
INTRAMUSCULAR | Status: DC | PRN
  Administered 2018-03-31: 10 mL

## 2018-03-31 MED ORDER — FENTANYL CITRATE (PF) 250 MCG/5ML IJ SOLN
INTRAMUSCULAR | Status: AC
Start: 2018-03-31 — End: ?
  Filled 2018-03-31: qty 5

## 2018-03-31 MED ORDER — LACTATED RINGERS IV SOLN
INTRAVENOUS | Status: DC | PRN
  Administered 2018-03-31: 10:00:00 via INTRAVENOUS

## 2018-03-31 MED FILL — traMADol HCL 50 MG TABS: 50 | 5 days supply | Qty: 20 | Fill #0

## 2018-03-31 SURGICAL SUPPLY — 36 items
APL SKNCLS STERI-STRIP NONHPOA (GAUZE/BANDAGES/DRESSINGS) ×1
APPLIER CLIP 5 13 M/L LIGAMAX5 (MISCELLANEOUS)
APR CLP MED LRG 5 ANG JAW (MISCELLANEOUS)
BAG SPEC RTRVL 10 TROC 200 (ENDOMECHANICALS)
BENZOIN TINCTURE PRP APPL 2/3 (GAUZE/BANDAGES/DRESSINGS) ×2 IMPLANT
CABLE HIGH FREQUENCY MONO STRZ (ELECTRODE) ×2 IMPLANT
CHLORAPREP W/TINT 26ML (MISCELLANEOUS) ×2 IMPLANT
CLIP APPLIE 5 13 M/L LIGAMAX5 (MISCELLANEOUS) IMPLANT
CLIP VESOLOCK MED LG 6/CT (CLIP) ×4 IMPLANT
COVER MAYO STAND STRL (DRAPES) IMPLANT
COVER TRANSDUCER ULTRASND (DRAPES) ×2 IMPLANT
DECANTER SPIKE VIAL GLASS SM (MISCELLANEOUS) ×2 IMPLANT
ELECT REM PT RETURN 15FT ADLT (MISCELLANEOUS) ×2 IMPLANT
GAUZE SPONGE 2X2 8PLY STRL LF (GAUZE/BANDAGES/DRESSINGS) ×1 IMPLANT
GLOVE BIO SURGEON STRL SZ7.5 (GLOVE) ×2 IMPLANT
GOWN STRL REUS W/TWL XL LVL3 (GOWN DISPOSABLE) ×4 IMPLANT
GRASPER SUT TROCAR 14GX15 (MISCELLANEOUS) ×1 IMPLANT
HEMOSTAT SURGICEL 4X8 (HEMOSTASIS) IMPLANT
KIT BASIN OR (CUSTOM PROCEDURE TRAY) ×2 IMPLANT
NDL INSUFFLATION 14GA 120MM (NEEDLE) ×1 IMPLANT
NEEDLE INSUFFLATION 14GA 120MM (NEEDLE) ×2 IMPLANT
POUCH RETRIEVAL ECOSAC 10 (ENDOMECHANICALS) IMPLANT
POUCH RETRIEVAL ECOSAC 10MM (ENDOMECHANICALS)
SCISSORS LAP 5X35 DISP (ENDOMECHANICALS) ×2 IMPLANT
SET CHOLANGIOGRAPH MIX (MISCELLANEOUS) IMPLANT
SET IRRIG TUBING LAPAROSCOPIC (IRRIGATION / IRRIGATOR) ×2 IMPLANT
SLEEVE XCEL OPT CAN 5 100 (ENDOMECHANICALS) ×2 IMPLANT
SPONGE GAUZE 2X2 STER 10/PKG (GAUZE/BANDAGES/DRESSINGS) ×1
STRIP CLOSURE SKIN 1/2X4 (GAUZE/BANDAGES/DRESSINGS) ×2 IMPLANT
SUT MNCRL AB 4-0 PS2 18 (SUTURE) ×2 IMPLANT
TOWEL OR 17X26 10 PK STRL BLUE (TOWEL DISPOSABLE) ×2 IMPLANT
TOWEL OR NON WOVEN STRL DISP B (DISPOSABLE) ×2 IMPLANT
TRAY LAPAROSCOPIC (CUSTOM PROCEDURE TRAY) ×2 IMPLANT
TROCAR BLADELESS OPT 5 100 (ENDOMECHANICALS) ×2 IMPLANT
TROCAR XCEL NON-BLD 11X100MML (ENDOMECHANICALS) ×2 IMPLANT
TUBING INSUF HEATED (TUBING) ×2 IMPLANT

## 2018-03-31 NOTE — Transfer of Care (Signed)
Immediate Anesthesia Transfer of Care Note  Patient: Dawn Reynolds  Procedure(s) Performed: LAPAROSCOPIC CHOLECYSTECTOMY (N/A Abdomen)  Patient Location: PACU  Anesthesia Type:General  Level of Consciousness: awake, alert  and patient cooperative  Airway & Oxygen Therapy: Patient Spontanous Breathing and Patient connected to face mask oxygen  Post-op Assessment: Report given to RN and Post -op Vital signs reviewed and stable  Post vital signs: Reviewed and stable  Last Vitals:  Vitals Value Taken Time  BP 163/86 03/31/2018 11:03 AM  Temp    Pulse 77 03/31/2018 11:04 AM  Resp 20 03/31/2018 11:04 AM  SpO2 100 % 03/31/2018 11:04 AM  Vitals shown include unvalidated device data.  Last Pain:  Vitals:   03/31/18 0934  TempSrc:   PainSc: 2       Patients Stated Pain Goal: 3 (24/46/28 6381)  Complications: No apparent anesthesia complications

## 2018-03-31 NOTE — Anesthesia Preprocedure Evaluation (Signed)
Anesthesia Evaluation  Patient identified by MRN, date of birth, ID band Patient awake    Reviewed: Allergy & Precautions, NPO status , Patient's Chart, lab work & pertinent test results  Airway Mallampati: II  TM Distance: >3 FB Neck ROM: Full    Dental no notable dental hx.    Pulmonary neg pulmonary ROS,    Pulmonary exam normal breath sounds clear to auscultation       Cardiovascular hypertension, Pt. on medications negative cardio ROS Normal cardiovascular exam Rhythm:Regular Rate:Normal     Neuro/Psych  Headaches, Depression negative neurological ROS  negative psych ROS   GI/Hepatic negative GI ROS, Neg liver ROS, GERD  ,  Endo/Other  negative endocrine ROS  Renal/GU negative Renal ROS  negative genitourinary   Musculoskeletal negative musculoskeletal ROS (+) Arthritis , Fibromyalgia -  Abdominal   Peds negative pediatric ROS (+)  Hematology negative hematology ROS (+)   Anesthesia Other Findings   Reproductive/Obstetrics negative OB ROS                             Anesthesia Physical Anesthesia Plan  ASA: II  Anesthesia Plan: General   Post-op Pain Management:    Induction: Intravenous  PONV Risk Score and Plan: 3 and Ondansetron, Dexamethasone and Midazolam  Airway Management Planned: Oral ETT  Additional Equipment:   Intra-op Plan:   Post-operative Plan: Extubation in OR  Informed Consent: I have reviewed the patients History and Physical, chart, labs and discussed the procedure including the risks, benefits and alternatives for the proposed anesthesia with the patient or authorized representative who has indicated his/her understanding and acceptance.   Dental advisory given  Plan Discussed with: CRNA  Anesthesia Plan Comments:         Anesthesia Quick Evaluation

## 2018-03-31 NOTE — Discharge Instructions (Signed)
[facility] ______[facility] °LAPAROSCOPIC SURGERY: POST OP INSTRUCTIONS °Always review your discharge instruction sheet given to you by the facility where your surgery was performed. °IF YOU HAVE DISABILITY OR FAMILY LEAVE FORMS, YOU MUST BRING THEM TO THE OFFICE FOR PROCESSING.   °DO NOT GIVE THEM TO YOUR DOCTOR. ° °1. A prescription for pain medication may be given to you upon discharge.  Take your pain medication as prescribed, if needed.  If narcotic pain medicine is not needed, then you may take acetaminophen (Tylenol) or ibuprofen (Advil) as needed. °2. Take your usually prescribed medications unless otherwise directed. °3. If you need a refill on your pain medication, please contact your pharmacy.  They will contact our office to request authorization. Prescriptions will not be filled after 5pm or on week-ends. °4. You should follow a light diet the first few days after arrival home, such as soup and crackers, etc.  Be sure to include lots of fluids daily. °5. Most patients will experience some swelling and bruising in the area of the incisions.  Ice packs will help.  Swelling and bruising can take several days to resolve.  °6. It is common to experience some constipation if taking pain medication after surgery.  Increasing fluid intake and taking a stool softener (such as Colace) will usually help or prevent this problem from occurring.  A mild laxative (Milk of Magnesia or Miralax) should be taken according to package instructions if there are no bowel movements after 48 hours. °7. Unless discharge instructions indicate otherwise, you may remove your bandages 24-48 hours after surgery, and you may shower at that time.  You may have steri-strips (small skin tapes) in place directly over the incision.  These strips should be left on the skin for 7-10 days.  If your surgeon used skin glue on the incision, you may shower in 24 hours.  The glue will flake off over the next 2-3 weeks.  Any sutures or  staples will be removed at the office during your follow-up visit. °8. ACTIVITIES:  You may resume regular (light) daily activities beginning the next day--such as daily self-care, walking, climbing stairs--gradually increasing activities as tolerated.  You may have sexual intercourse when it is comfortable.  Refrain from any heavy lifting or straining until approved by your doctor. °a. You may drive when you are no longer taking prescription pain medication, you can comfortably wear a seatbelt, and you can safely maneuver your car and apply brakes. °b. RETURN TO WORK:  __________________________________________________________ °9. You should see your doctor in the office for a follow-up appointment approximately 2-3 weeks after your surgery.  Make sure that you call for this appointment within a day or two after you arrive home to insure a convenient appointment time. °10. OTHER INSTRUCTIONS: __________________________________________________________________________________________________________________________ __________________________________________________________________________________________________________________________ °WHEN TO CALL YOUR DOCTOR: °1. Fever over 101.0 °2. Inability to urinate °3. Continued bleeding from incision. °4. Increased pain, redness, or drainage from the incision. °5. Increasing abdominal pain ° °The clinic staff is available to answer your questions during regular business hours.  Please don’t hesitate to call and ask to speak to one of the nurses for clinical concerns.  If you have a medical emergency, go to the nearest emergency room or call 911.  A surgeon from [facility] is always on call at the hospital. °[address], [address], [place], [place]  [address] ? [address] [address], [place],    [address] °[phone] ? [phone] ? FAX [phone] °Web site:   [email or web address] °

## 2018-03-31 NOTE — Op Note (Signed)
03/31/2018  10:50 AM  PATIENT:  Dawn Reynolds  52 y.o. female  PRE-OPERATIVE DIAGNOSIS:  gallstones  POST-OPERATIVE DIAGNOSIS:  gallstones  PROCEDURE:  Procedure(s): LAPAROSCOPIC CHOLECYSTECTOMY (N/A)  SURGEON:  Surgeon(s) and Role:    Ralene Ok, MD - Primary  ANESTHESIA:   local and general  EBL:  10 mL   BLOOD ADMINISTERED:none  DRAINS: none   LOCAL MEDICATIONS USED:  BUPIVICAINE   SPECIMEN:  Source of Specimen:  gallbladder  DISPOSITION OF SPECIMEN:  PATHOLOGY  COUNTS:  YES  TOURNIQUET:  * No tourniquets in log *  DICTATION: .Dragon Dictation  EBL: <3ZJ   Complications: none   Counts: reported as correct x 2   Findings:chronic inflammation of the gallbladder  Indications for procedure: Pt is a 73 F with RUQ pain and seen to have gallstones.   Details of the procedure: The patient was taken to the operating and placed in the supine position with bilateral SCDs in place. A time out was called and all facts were verified. A pneumoperitoneum was obtained via A Veress needle technique to a pressure of 53mm of mercury. A 25mm trochar was then placed in the right upper quadrant under visualization, and there were no injuries to any abdominal organs. A 11 mm port was then placed in the umbilical region after infiltrating with local anesthesia under direct visualization. A second epigastric port was placed under direct visualization.   The gallbladder was identified and retracted, the peritoneum was then sharply dissected from the gallbladder and this dissection was carried down to Calot's triangle. The cystic duct was identified and dissected circumferentially and seen going into the gallbladder 360.  The cystic artery was dissected away from the surrounding tissues.   The critical angle was obtained.    2 clips were placed proximally one distally and the cystic duct transected. The cystic artery was identified and 2 clips placed proximally and one distally and  transected. We then proceeded to remove the gallbladder off the hepatic fossa with Bovie cautery. A retrieval bag was then placed in the abdomen and gallbladder placed in the bag. The hepatic fossa was then reexamined and hemostasis was achieved with Bovie cautery and was excellent at this portion of the case. The subhepatic fossa and perihepatic fossa was then irrigated until the effluent was clear. The specimen bag and specimen were removed from the abdominal cavity.  The 11 mm trocar fascia was reapproximated with the Endo Close #1 Vicryl x2. The pneumoperitoneum was evacuated and all trochars removed under direct visulalization. The skin was then closed with 4-0 Monocryl and the skin dressed with Steri-Strips, gauze, and tape. The patient was awaken from general anesthesia and taken to the recovery room in stable condition.    PLAN OF CARE: Discharge to home after PACU  PATIENT DISPOSITION:  PACU - hemodynamically stable.   Delay start of Pharmacological VTE agent (>24hrs) due to surgical blood loss or risk of bleeding: not applicable

## 2018-03-31 NOTE — Interval H&P Note (Signed)
History and Physical Interval Note:  03/31/2018 9:46 AM  Dawn Reynolds  has presented today for surgery, with the diagnosis of gallstones  The various methods of treatment have been discussed with the patient and family. After consideration of risks, benefits and other options for treatment, the patient has consented to  Procedure(s): LAPAROSCOPIC CHOLECYSTECTOMY (N/A) as a surgical intervention .  The patient's history has been reviewed, patient examined, no change in status, stable for surgery.  I have reviewed the patient's chart and labs.  Questions were answered to the patient's satisfaction.     Rosario Jacks., Anne Hahn

## 2018-03-31 NOTE — Anesthesia Postprocedure Evaluation (Signed)
Anesthesia Post Note  Patient: Dawn Reynolds  Procedure(s) Performed: LAPAROSCOPIC CHOLECYSTECTOMY (N/A Abdomen)     Patient location during evaluation: PACU Anesthesia Type: General Level of consciousness: awake and alert Pain management: pain level controlled Vital Signs Assessment: post-procedure vital signs reviewed and stable Respiratory status: spontaneous breathing, nonlabored ventilation and respiratory function stable Cardiovascular status: blood pressure returned to baseline and stable Postop Assessment: no apparent nausea or vomiting Anesthetic complications: no    Last Vitals:  Vitals:   03/31/18 1145 03/31/18 1200  BP: (!) 157/87 138/79  Pulse: 79 70  Resp: 18 17  Temp:    SpO2: 100% 95%    Last Pain:  Vitals:   03/31/18 1200  TempSrc:   PainSc: [place]

## 2018-03-31 NOTE — Anesthesia Procedure Notes (Signed)
Procedure Name: Intubation Date/Time: 03/31/2018 10:08 AM Performed by: [place] Matteus Mcnelly C, CRNA Pre-anesthesia Checklist: Patient identified, Emergency Drugs available, Suction available and Patient being monitored Patient Re-evaluated:Patient Re-evaluated prior to induction Oxygen Delivery Method: Circle system utilized Preoxygenation: Pre-oxygenation with 100% oxygen Induction Type: IV induction Ventilation: Mask ventilation without difficulty Laryngoscope Size: Mac and 3 Grade View: Grade I Tube type: Oral Tube size: 7.0 mm Number of attempts: 1 Airway Equipment and Method: Stylet and Oral airway Placement Confirmation: ETT inserted through vocal cords under direct vision,  positive ETCO2 and breath sounds checked- equal and bilateral Secured at: 21 cm Tube secured with: Tape Dental Injury: Teeth and Oropharynx as per pre-operative assessment

## 2018-04-04 NOTE — Progress Notes (Signed)
[facility] [address], [place], [place] [address] [phone]  Date:  04/06/2018   Name:  Dawn Reynolds   DOB:  1966/08/26   MRN:  [id number]  PCP:  Darreld Mclean, MD    Chief Complaint: New Patient (Initial Visit) (establish care); Urinary Tract Infection (recurrent utis); and Annual Exam (trouble sleeping, wanting lab work, RLS)   History of Present Illness:  Dawn Reynolds Comment is a 52 y.o. very pleasant female patient who presents with the following:  Here today as a new patient to establish care and do a CPE History of HTN, FBM She was admitted overnight last year due to CP, ruled out  Just had a lap chole earlier this month due to acute cholecystitis.  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   Never had any cervical cancer or abnl pap  She did have a colonoscopy about 12 years ago for bleeding.   All ok per her recollection Family history- her uncle had colon cancer I will set up a GI appt for her for screening colonoscopy  mammo- last done in 2017. Will arrange for her  She recently got divorced and moved back to this area In her free time she likes to travel She has 4 children- they live in [place], [place], [place] and [place] She has 6 grands all together   She notes a history of high tris in the past and would like to check labs today Also would like an A1c- never dx with DM but she does have a strong family history of same  She has lost 65 lbs in the last 18 months  She uses HCTZ 12.5 for her BP and for swelling - we have noted low K however so will recheck this as well She does use omeprazole daily  cymbalta 60 once a day for fibromyalgia pain   She ate just lightly  this am  Pt also mentions that she has had several UTI in the last several months, although she does not have sx now. She does have a new partner and does notice that her UTI seem to follow intercourse.  She would be interested in using macrobid after intercourse in hopes of preventing such frequent UTI  Pt also mentions that she has leg pains chronically which can keep her up at night.  At this point I had to ask pt to make a follow-up visit to discuss other concerns, as she was late to her appt today and we started her 30 min appt 15 min late.  Pt is displeased, but I explained that it is not due to my not wishing to help her, but due to other patients waiting which is unfair to them. However for the time being we can try going up on her cymbalta to 120 and see if this may help, and I am glad to see her again at her convenience to discuss this or other concerns in greater detail   BP Readings from Last 3 Encounters:  04/06/18 120/86  03/31/18 (!) 144/82  03/21/18 (!) 141/82    Patient Active Problem List   Diagnosis Date Noted  .  Chest pain 07/09/2017  . Fibromyalgia 07/09/2017  . Hypertension 07/09/2017     Past Medical History:  Diagnosis Date  . Anemia    hx of  . Arthritis   . Chicken pox   . Depression    hx of in past situational  . Fibromyalgia   . GERD (gastroesophageal reflux disease)   . Headache    hx of migraines  . History of kidney stones    1996  . Hypertension     Past Surgical History:  Procedure Laterality Date  . ABDOMINAL HYSTERECTOMY    . CHOLECYSTECTOMY     03-31-18 Dawn Reynolds  . CHOLECYSTECTOMY N/A 03/31/2018   Procedure: LAPAROSCOPIC CHOLECYSTECTOMY;  Surgeon: Ralene Ok, MD;  Location: [facility];  Service: General;  Laterality: N/A;  . endoplantar fasciotomy    . GALLBLADDER SURGERY  03/31/2018  . KNEE ARTHROSCOPY     left knee 2012  . LAPAROSCOPIC CHOLECYSTECTOMY    . TONSILLECTOMY     1999    Social History   Tobacco Use  . Smoking  status: Never Smoker  . Smokeless tobacco: Never Used  Substance Use Topics  . Alcohol use: Yes    Comment: occ  . Drug use: No    Family History  Problem Relation Age of Onset  . Heart disease Father   . Mitral valve prolapse Mother     Allergies  Allergen Reactions  . Aspartame Other (See Comments)    Headaches.  . Demerol [Meperidine] Nausea And Vomiting    Medication list has been reviewed and updated.  Current Outpatient Medications on File Prior to Visit  Medication Sig Dispense Refill  . amLODipine (NORVASC) 10 MG tablet Take 10 mg by mouth daily.    . Ascorbic Acid (VITAMIN C) 1000 MG tablet Take 1,000 mg by mouth daily.    . B Complex Vitamins (B COMPLEX 1 PO) Take 1 tablet by mouth daily.     Marland Kitchen BIOTIN PO Take 1 capsule by mouth daily.    Marland Kitchen CALCIUM PO Take 1 tablet by mouth daily.    . cyclobenzaprine (FLEXERIL) 10 MG tablet Take 10 mg by mouth 3 (three) times daily as needed for muscle spasms.     . Flaxseed, Linseed, (FLAXSEED OIL PO) Take 1 capsule by mouth daily.    . hydrochlorothiazide (MICROZIDE) 12.5 MG capsule Take 12.5 mg by mouth daily.    Marland Kitchen ibuprofen (ADVIL,MOTRIN) 800 MG tablet Take 800 mg by mouth every 8 (eight) hours as needed for moderate pain.    Marland Kitchen MEGARED OMEGA-3 KRILL OIL PO Take 1 capsule by mouth daily.    . meloxicam (MOBIC) 15 MG tablet Take 15 mg by mouth daily.    Marland Kitchen omeprazole (PRILOSEC) 20 MG capsule Take 20 mg by mouth daily.     . traMADol (ULTRAM) 50 MG tablet Take 1 tablet (50 mg total) by mouth every 6 (six) hours as needed. 20 tablet 0  . Turmeric 500 MG TABS Take 500 mg by mouth daily.     No current facility-administered medications on file prior to visit.     Review of Systems:  As per HPI- otherwise negative.   Physical Examination: Vitals:   04/06/18 0919  BP: 120/86  Pulse: 86  Resp: 16  SpO2: 99%   Vitals:   04/06/18 0919  Weight: 234 lb (106.1 kg)  Height: 5\' 6"  (1.676 m)   Body mass index is 37.77  kg/m. Ideal Body Weight: Weight in (lb) to have BMI =  25: 154.6  GEN: WDWN, NAD, Non-toxic, A & O x 3, obese, otherwise looks well today HEENT: Atraumatic, Normocephalic. Neck supple. No masses, No LAD.  Bilateral TM wnl, oropharynx normal.  PEERL,EOMI.   Ears and Nose: No external deformity. CV: RRR, No M/G/R. No JVD. No thrill. No extra heart sounds. PULM: CTA B, no wheezes, crackles, rhonchi. No retractions. No resp. distress. No accessory muscle use. ABD: S, NT, ND, +BS. No rebound. No HSM. Healing lab chole incision on abdomen, do not appear infected  EXTR: No c/c/e NEURO Normal gait.  PSYCH: Normally interactive. Conversant. Not depressed or anxious appearing.  Calm demeanor.    Assessment and Plan: Physical exam  Essential hypertension - Plan: CBC  Fibromyalgia - Plan: DULoxetine (CYMBALTA) 60 MG capsule  High triglycerides - Plan: Lipid panel  Frequent UTI - Plan: nitrofurantoin, macrocrystal-monohydrate, (MACROBID) 100 MG capsule  Family history of diabetes mellitus - Plan: Comprehensive metabolic panel, Hemoglobin A1c  Screening for breast cancer - Plan: MM 3D SCREEN BREAST BILATERAL  Screening for colon cancer - Plan: Ambulatory referral to Gastroenterology  Obesity (BMI 35.0-39.9 without comorbidity)  CPE today Ordered mammo and GI consult Labs pending as above Will have her try macrobid prn after intercourse Increase cymbalta to 120 for chronic leg pain thought due to [facility]   Signed Lamar Blinks, MD

## 2018-04-06 ENCOUNTER — Ambulatory Visit (INDEPENDENT_AMBULATORY_CARE_PROVIDER_SITE_OTHER): Payer: 59 | Admitting: Family Medicine

## 2018-04-06 ENCOUNTER — Telehealth: Payer: Self-pay | Admitting: Family Medicine

## 2018-04-06 ENCOUNTER — Other Ambulatory Visit (INDEPENDENT_AMBULATORY_CARE_PROVIDER_SITE_OTHER): Payer: 59

## 2018-04-06 ENCOUNTER — Encounter: Payer: Self-pay | Admitting: Family Medicine

## 2018-04-06 VITALS — BP 120/86 | HR 86 | Resp 16 | Ht 66.0 in | Wt 234.0 lb

## 2018-04-06 DIAGNOSIS — Z1231 Encounter for screening mammogram for malignant neoplasm of breast: Secondary | ICD-10-CM | POA: Diagnosis not present

## 2018-04-06 DIAGNOSIS — M797 Fibromyalgia: Secondary | ICD-10-CM | POA: Diagnosis not present

## 2018-04-06 DIAGNOSIS — Z0000 Encounter for general adult medical examination without abnormal findings: Secondary | ICD-10-CM | POA: Diagnosis not present

## 2018-04-06 DIAGNOSIS — N390 Urinary tract infection, site not specified: Secondary | ICD-10-CM | POA: Diagnosis not present

## 2018-04-06 DIAGNOSIS — E781 Pure hyperglyceridemia: Secondary | ICD-10-CM | POA: Diagnosis not present

## 2018-04-06 DIAGNOSIS — Z1211 Encounter for screening for malignant neoplasm of colon: Secondary | ICD-10-CM | POA: Diagnosis not present

## 2018-04-06 DIAGNOSIS — Z1239 Encounter for other screening for malignant neoplasm of breast: Secondary | ICD-10-CM

## 2018-04-06 DIAGNOSIS — I10 Essential (primary) hypertension: Secondary | ICD-10-CM

## 2018-04-06 DIAGNOSIS — E669 Obesity, unspecified: Secondary | ICD-10-CM | POA: Diagnosis not present

## 2018-04-06 DIAGNOSIS — Z833 Family history of diabetes mellitus: Secondary | ICD-10-CM | POA: Diagnosis not present

## 2018-04-06 LAB — COMPREHENSIVE METABOLIC PANEL
ALT: 18 U/L (ref 0–35)
AST: 17 U/L (ref 0–37)
Albumin: 4.3 g/dL (ref 3.5–5.2)
Alkaline Phosphatase: 73 U/L (ref 39–117)
BUN: 16 mg/dL (ref 6–23)
CHLORIDE: 101 meq/L (ref 96–112)
CO2: 27 mEq/L (ref 19–32)
Calcium: 9.7 mg/dL (ref 8.4–10.5)
Creatinine, Ser: 0.86 mg/dL (ref 0.40–1.20)
GFR: 73.71 mL/min (ref >60.00)
GLUCOSE: 103 mg/dL — AB (ref 70–99)
POTASSIUM: 4.1 meq/L (ref 3.5–5.1)
SODIUM: 139 meq/L (ref 135–145)
Total Bilirubin: 0.9 mg/dL (ref 0.2–1.2)
Total Protein: 7.3 g/dL (ref 6.0–8.3)

## 2018-04-06 LAB — CBC
HEMATOCRIT: 44.9 % (ref 36.0–46.0)
Hemoglobin: 14.9 g/dL (ref 12.0–15.0)
MCHC: 33.2 g/dL (ref 30.0–36.0)
MCV: 86.5 fl (ref 78.0–100.0)
Platelets: 349 10*3/uL (ref 150.0–400.0)
RBC: 5.19 Mil/uL — ABNORMAL HIGH (ref 3.87–5.11)
RDW: 15.5 % (ref 11.5–15.5)
WBC: 8.7 10*3/uL (ref 4.0–10.5)

## 2018-04-06 LAB — LIPID PANEL
CHOL/HDL RATIO: 6
Cholesterol: 246 mg/dL — ABNORMAL HIGH (ref 0–200)
HDL: 43.6 mg/dL (ref >39.00)
Triglycerides: 403 mg/dL — ABNORMAL HIGH (ref 0.0–149.0)

## 2018-04-06 LAB — LDL CHOLESTEROL, DIRECT: LDL DIRECT: 125 mg/dL

## 2018-04-06 LAB — HEMOGLOBIN A1C: Hgb A1c MFr Bld: 6 % (ref 4.6–6.5)

## 2018-04-06 LAB — TSH: TSH: 4.04 u[IU]/mL (ref 0.35–4.50)

## 2018-04-06 MED ORDER — DULOXETINE HCL 60 MG PO CPEP
60.0000 mg | ORAL_CAPSULE | Freq: Two times a day (BID) | ORAL | 3 refills | Status: DC
Start: 2018-04-06 — End: 2019-05-01

## 2018-04-06 MED ORDER — NITROFURANTOIN MONOHYD MACRO 100 MG PO CAPS: ORAL_CAPSULE | ORAL | 2 refills | Status: DC

## 2018-04-06 NOTE — Telephone Encounter (Signed)
Called back- ok to stick with macrobid

## 2018-04-06 NOTE — Telephone Encounter (Signed)
Copied from [place] [id number]. Topic: Quick Communication - See Telephone Encounter >> Apr 06, 2018 10:51 AM Bea Graff, NT wrote: CRM for notification. See Telephone encounter for: 04/06/18. Elmo Putt with [facility] calling and states that the nitrofurantoin, macrocrystal-monohydrate, (MACROBID) 100 MG capsule that was called in is usually used to treat a UTI and the Macrodentin is usually used to prevent a UTI and would like to see if the doctor would like to change the rx. CB#: [phone]

## 2018-04-06 NOTE — Patient Instructions (Addendum)
It was good to see you today- take care and I am glad to see you again in the near future to discuss any other concerns I will be in touch with your labs and will set up a GI consultation and mammogram for you  Let's have you try taking macrobid 1 dose after intercourse as needed to prevent UTI.  If you are having intercourse frequently you might try taking a pill every 3 days or so, or just if you feel like you are getting an infection  For your leg pain, we can try going up on your dose of cymbalta to 120 mg - you can take 60 BID or 120 all at once However please wait until you finish up your tramadol to increase your cymbalta dose due to risk of seratonin syndrome Health Maintenance, Female Adopting a healthy lifestyle and getting preventive care can go a long way to promote health and wellness. Talk with your health care provider about what schedule of regular examinations is right for you. This is a good chance for you to check in with your provider about disease prevention and staying healthy. In between checkups, there are plenty of things you can do on your own. Experts have done a lot of research about which lifestyle changes and preventive measures are most likely to keep you healthy. Ask your health care provider for more information. Weight and diet Eat a healthy diet  Be sure to include plenty of vegetables, fruits, low-fat dairy products, and lean protein.  Do not eat a lot of foods high in solid fats, added sugars, or salt.  Get regular exercise. This is one of the most important things you can do for your health. ? Most adults should exercise for at least 150 minutes each week. The exercise should increase your heart rate and make you sweat (moderate-intensity exercise). ? Most adults should also do strengthening exercises at least twice a week. This is in addition to the moderate-intensity exercise.  Maintain a healthy weight  Body mass index (BMI) is a measurement that can be  used to identify possible weight problems. It estimates body fat based on height and weight. Your health care provider can help determine your BMI and help you achieve or maintain a healthy weight.  For females 92 years of age and older: ? A BMI below 18.5 is considered underweight. ? A BMI of 18.5 to 24.9 is normal. ? A BMI of 25 to 29.9 is considered overweight. ? A BMI of 30 and above is considered obese.  Watch levels of cholesterol and blood lipids  You should start having your blood tested for lipids and cholesterol at 52 years of age, then have this test every 5 years.  You may need to have your cholesterol levels checked more often if: ? Your lipid or cholesterol levels are high. ? You are older than 52 years of age. ? You are at high risk for heart disease.  Cancer screening Lung Cancer  Lung cancer screening is recommended for adults 35-55 years old who are at high risk for lung cancer because of a history of smoking.  A yearly low-dose CT scan of the lungs is recommended for people who: ? Currently smoke. ? Have quit within the past 15 years. ? Have at least a 30-pack-year history of smoking. A pack year is smoking an average of one pack of cigarettes a day for 1 year.  Yearly screening should continue until it has been 15 years since  you quit.  Yearly screening should stop if you develop a health problem that would prevent you from having lung cancer treatment.  Breast Cancer  Practice breast self-awareness. This means understanding how your breasts normally appear and feel.  It also means doing regular breast self-exams. Let your health care provider know about any changes, no matter how small.  If you are in your 20s or 30s, you should have a clinical breast exam (CBE) by a health care provider every 1-3 years as part of a regular health exam.  If you are 65 or older, have a CBE every year. Also consider having a breast X-ray (mammogram) every year.  If you have  a family history of breast cancer, talk to your health care provider about genetic screening.  If you are at high risk for breast cancer, talk to your health care provider about having an MRI and a mammogram every year.  Breast cancer gene (BRCA) assessment is recommended for women who have family members with BRCA-related cancers. BRCA-related cancers include: ? Breast. ? Ovarian. ? Tubal. ? Peritoneal cancers.  Results of the assessment will determine the need for genetic counseling and BRCA1 and BRCA2 testing.  Cervical Cancer Your health care provider may recommend that you be screened regularly for cancer of the pelvic organs (ovaries, uterus, and vagina). This screening involves a pelvic examination, including checking for microscopic changes to the surface of your cervix (Pap test). You may be encouraged to have this screening done every 3 years, beginning at age 20.  For women ages 36-65, health care providers may recommend pelvic exams and Pap testing every 3 years, or they may recommend the Pap and pelvic exam, combined with testing for human papilloma virus (HPV), every 5 years. Some types of HPV increase your risk of cervical cancer. Testing for HPV may also be done on women of any age with unclear Pap test results.  Other health care providers may not recommend any screening for nonpregnant women who are considered low risk for pelvic cancer and who do not have symptoms. Ask your health care provider if a screening pelvic exam is right for you.  If you have had past treatment for cervical cancer or a condition that could lead to cancer, you need Pap tests and screening for cancer for at least 20 years after your treatment. If Pap tests have been discontinued, your risk factors (such as having a new sexual partner) need to be reassessed to determine if screening should resume. Some women have medical problems that increase the chance of getting cervical cancer. In these cases, your  health care provider may recommend more frequent screening and Pap tests.  Colorectal Cancer  This type of cancer can be detected and often prevented.  Routine colorectal cancer screening usually begins at 52 years of age and continues through 52 years of age.  Your health care provider may recommend screening at an earlier age if you have risk factors for colon cancer.  Your health care provider may also recommend using home test kits to check for hidden blood in the stool.  A small camera at the end of a tube can be used to examine your colon directly (sigmoidoscopy or colonoscopy). This is done to check for the earliest forms of colorectal cancer.  Routine screening usually begins at age 51.  Direct examination of the colon should be repeated every 5-10 years through 52 years of age. However, you may need to be screened more often if early  forms of precancerous polyps or small growths are found.  Skin Cancer  Check your skin from head to toe regularly.  Tell your health care provider about any new moles or changes in moles, especially if there is a change in a mole's shape or color.  Also tell your health care provider if you have a mole that is larger than the size of a pencil eraser.  Always use sunscreen. Apply sunscreen liberally and repeatedly throughout the day.  Protect yourself by wearing long sleeves, pants, a wide-brimmed hat, and sunglasses whenever you are outside.  Heart disease, diabetes, and high blood pressure  High blood pressure causes heart disease and increases the risk of stroke. High blood pressure is more likely to develop in: ? People who have blood pressure in the high end of the normal range (130-139/85-89 mm Hg). ? People who are overweight or obese. ? People who are African American.  If you are 81-27 years of age, have your blood pressure checked every 3-5 years. If you are 64 years of age or older, have your blood pressure checked every year. You  should have your blood pressure measured twice-once when you are at a hospital or clinic, and once when you are not at a hospital or clinic. Record the average of the two measurements. To check your blood pressure when you are not at a hospital or clinic, you can use: ? An automated blood pressure machine at a pharmacy. ? A home blood pressure monitor.  If you are between 24 years and 36 years old, ask your health care provider if you should take aspirin to prevent strokes.  Have regular diabetes screenings. This involves taking a blood sample to check your fasting blood sugar level. ? If you are at a normal weight and have a low risk for diabetes, have this test once every three years after 52 years of age. ? If you are overweight and have a high risk for diabetes, consider being tested at a younger age or more often. Preventing infection Hepatitis B  If you have a higher risk for hepatitis B, you should be screened for this virus. You are considered at high risk for hepatitis B if: ? You were born in a country where hepatitis B is common. Ask your health care provider which countries are considered high risk. ? Your parents were born in a high-risk country, and you have not been immunized against hepatitis B (hepatitis B vaccine). ? You have HIV or AIDS. ? You use needles to inject street drugs. ? You live with someone who has hepatitis B. ? You have had sex with someone who has hepatitis B. ? You get hemodialysis treatment. ? You take certain medicines for conditions, including cancer, organ transplantation, and autoimmune conditions.  Hepatitis C  Blood testing is recommended for: ? Everyone born from [birth year] through 1965. ? Anyone with known risk factors for hepatitis C.  Sexually transmitted infections (STIs)  You should be screened for sexually transmitted infections (STIs) including gonorrhea and chlamydia if: ? You are sexually active and are younger than 52 years of age. ? You  are older than 52 years of age and your health care provider tells you that you are at risk for this type of infection. ? Your sexual activity has changed since you were last screened and you are at an increased risk for chlamydia or gonorrhea. Ask your health care provider if you are at risk.  If you do not have HIV,  but are at risk, it may be recommended that you take a prescription medicine daily to prevent HIV infection. This is called pre-exposure prophylaxis (PrEP). You are considered at risk if: ? You are sexually active and do not regularly use condoms or know the HIV status of your partner(s). ? You take drugs by injection. ? You are sexually active with a partner who has HIV.  Talk with your health care provider about whether you are at high risk of being infected with HIV. If you choose to begin PrEP, you should first be tested for HIV. You should then be tested every 3 months for as long as you are taking PrEP. Pregnancy  If you are premenopausal and you may become pregnant, ask your health care provider about preconception counseling.  If you may become pregnant, take 400 to 800 micrograms (mcg) of folic acid every day.  If you want to prevent pregnancy, talk to your health care provider about birth control (contraception). Osteoporosis and menopause  Osteoporosis is a disease in which the bones lose minerals and strength with aging. This can result in serious bone fractures. Your risk for osteoporosis can be identified using a bone density scan.  If you are 61 years of age or older, or if you are at risk for osteoporosis and fractures, ask your health care provider if you should be screened.  Ask your health care provider whether you should take a calcium or vitamin D supplement to lower your risk for osteoporosis.  Menopause may have certain physical symptoms and risks.  Hormone replacement therapy may reduce some of these symptoms and risks. Talk to your health care  provider about whether hormone replacement therapy is right for you. Follow these instructions at home:  Schedule regular health, dental, and eye exams.  Stay current with your immunizations.  Do not use any tobacco products including cigarettes, chewing tobacco, or electronic cigarettes.  If you are pregnant, do not drink alcohol.  If you are breastfeeding, limit how much and how often you drink alcohol.  Limit alcohol intake to no more than 1 drink per day for nonpregnant women. One drink equals 12 ounces of beer, 5 ounces of wine, or 1 ounces of hard liquor.  Do not use street drugs.  Do not share needles.  Ask your health care provider for help if you need support or information about quitting drugs.  Tell your health care provider if you often feel depressed.  Tell your health care provider if you have ever been abused or do not feel safe at home. This information is not intended to replace advice given to you by your health care provider. Make sure you discuss any questions you have with your health care provider. Document Released: 05/10/2011 Document Revised: 04/01/2016 Document Reviewed: 07/29/2015 Elsevier Interactive Patient Education  [employer].

## 2018-04-07 ENCOUNTER — Encounter: Payer: Self-pay | Admitting: Family Medicine

## 2018-04-07 DIAGNOSIS — E781 Pure hyperglyceridemia: Secondary | ICD-10-CM

## 2018-04-10 ENCOUNTER — Encounter: Payer: Self-pay | Admitting: Internal Medicine

## 2018-04-11 ENCOUNTER — Other Ambulatory Visit: Payer: Self-pay | Admitting: Family Medicine

## 2018-04-11 DIAGNOSIS — Z833 Family history of diabetes mellitus: Secondary | ICD-10-CM

## 2018-04-11 MED ORDER — ATORVASTATIN CALCIUM 20 MG PO TABS: 20.0000 mg | ORAL_TABLET | Freq: Every day | ORAL | 3 refills | Status: DC

## 2018-04-11 NOTE — Addendum Note (Signed)
Addended by: Lamar Blinks C on: 04/11/2018 03:38 PM   Modules accepted: Orders

## 2018-04-12 ENCOUNTER — Encounter: Payer: Self-pay | Admitting: Family Medicine

## 2018-04-12 DIAGNOSIS — E781 Pure hyperglyceridemia: Secondary | ICD-10-CM

## 2018-04-12 MED FILL — DULoxetine HCL 60 MG CPEP: 60 | 90 days supply | Qty: 180 | Fill #0

## 2018-04-13 MED ORDER — AMLODIPINE BESYLATE 10 MG PO TABS
10.0000 mg | ORAL_TABLET | Freq: Every day | ORAL | 3 refills | Status: DC
Start: 2018-04-13 — End: 2019-05-01

## 2018-04-13 MED ORDER — ATORVASTATIN CALCIUM 20 MG PO TABS: 20.0000 mg | ORAL_TABLET | Freq: Every day | ORAL | 3 refills | Status: DC

## 2018-04-13 MED ORDER — HYDROCHLOROTHIAZIDE 12.5 MG PO CAPS: 12.5000 mg | ORAL_CAPSULE | Freq: Every day | ORAL | 3 refills | Status: DC

## 2018-04-13 MED ORDER — MELOXICAM 15 MG PO TABS: 15.0000 mg | ORAL_TABLET | Freq: Every day | ORAL | 3 refills | Status: DC

## 2018-04-13 MED ORDER — OMEPRAZOLE 20 MG PO CPDR: 20.0000 mg | DELAYED_RELEASE_CAPSULE | Freq: Every day | ORAL | 3 refills | Status: DC

## 2018-04-13 MED FILL — ATORVASTATIN 20 MG TABLET: 20 | 90 days supply | Qty: 90 | Fill #0

## 2018-04-13 MED FILL — OMEPRAZOLE 20 MG CAP: 20 | 90 days supply | Qty: 90 | Fill #0

## 2018-04-13 MED FILL — AMLODIPINE BESYLATE 10 MG T: 10 | 90 days supply | Qty: 90 | Fill #0

## 2018-04-13 MED FILL — HYDROCHLOROTHIAZIDE 12.5 MG: 12.5 | 90 days supply | Qty: 90 | Fill #0

## 2018-04-13 MED FILL — MELOXICAM 15 MG TABLET: 15 | 90 days supply | Qty: 90 | Fill #0

## 2018-04-17 ENCOUNTER — Encounter (HOSPITAL_BASED_OUTPATIENT_CLINIC_OR_DEPARTMENT_OTHER): Payer: Self-pay

## 2018-04-17 ENCOUNTER — Other Ambulatory Visit: Payer: Self-pay

## 2018-04-17 ENCOUNTER — Encounter: Payer: Self-pay | Admitting: Family Medicine

## 2018-04-17 ENCOUNTER — Emergency Department (HOSPITAL_BASED_OUTPATIENT_CLINIC_OR_DEPARTMENT_OTHER)
Admission: EM | Admit: 2018-04-17 | Discharge: 2018-04-17 | Disposition: A | Discharge status: Home / Self Care | Payer: 59 | Attending: Emergency Medicine | Admitting: Emergency Medicine

## 2018-04-17 ENCOUNTER — Emergency Department (HOSPITAL_BASED_OUTPATIENT_CLINIC_OR_DEPARTMENT_OTHER): Payer: 59

## 2018-04-17 DIAGNOSIS — S299XXA Unspecified injury of thorax, initial encounter: Secondary | ICD-10-CM | POA: Diagnosis not present

## 2018-04-17 DIAGNOSIS — Z79899 Other long term (current) drug therapy: Secondary | ICD-10-CM | POA: Diagnosis not present

## 2018-04-17 DIAGNOSIS — S199XXA Unspecified injury of neck, initial encounter: Secondary | ICD-10-CM | POA: Diagnosis not present

## 2018-04-17 DIAGNOSIS — I10 Essential (primary) hypertension: Secondary | ICD-10-CM | POA: Insufficient documentation

## 2018-04-17 DIAGNOSIS — M7918 Myalgia, other site: Secondary | ICD-10-CM | POA: Diagnosis not present

## 2018-04-17 DIAGNOSIS — R51 Headache: Secondary | ICD-10-CM | POA: Diagnosis not present

## 2018-04-17 DIAGNOSIS — M542 Cervicalgia: Secondary | ICD-10-CM | POA: Diagnosis not present

## 2018-04-17 DIAGNOSIS — M546 Pain in thoracic spine: Secondary | ICD-10-CM | POA: Insufficient documentation

## 2018-04-17 MED ORDER — OXYCODONE-ACETAMINOPHEN 5-325 MG PO TABS
1.0000 | ORAL_TABLET | Freq: Once | ORAL | Status: AC
  Administered 2018-04-17: 1 via ORAL
  Filled 2018-04-17: qty 1

## 2018-04-17 MED ORDER — IBUPROFEN 800 MG PO TABS
800.0000 mg | ORAL_TABLET | Freq: Once | ORAL | Status: AC
  Administered 2018-04-17: 800 mg via ORAL
  Filled 2018-04-17: qty 1
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
## 2018-04-17 NOTE — ED Notes (Signed)
ED Provider at bedside. 
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
## 2018-04-20 ENCOUNTER — Encounter: Payer: Self-pay | Admitting: Family Medicine

## 2018-04-20 ENCOUNTER — Ambulatory Visit (INDEPENDENT_AMBULATORY_CARE_PROVIDER_SITE_OTHER): Payer: 59 | Admitting: Family Medicine

## 2018-04-20 DIAGNOSIS — R200 Anesthesia of skin: Secondary | ICD-10-CM

## 2018-04-20 DIAGNOSIS — S46819A Strain of other muscles, fascia and tendons at shoulder and upper arm level, unspecified arm, initial encounter: Secondary | ICD-10-CM

## 2018-04-20 MED ORDER — OXYCODONE-ACETAMINOPHEN 5-325 MG PO TABS: ORAL_TABLET | ORAL | 0 refills | Status: DC

## 2018-04-20 MED FILL — OXYCODONE-ACETAMINOPHEN 5-3: 5-325 | 10 days supply | Qty: 10 | Fill #0
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
## 2018-06-07 ENCOUNTER — Ambulatory Visit (INDEPENDENT_AMBULATORY_CARE_PROVIDER_SITE_OTHER): Payer: 59 | Admitting: Internal Medicine

## 2018-06-07 ENCOUNTER — Encounter: Payer: Self-pay | Admitting: Internal Medicine

## 2018-06-07 VITALS — BP 120/78 | HR 68 | Ht 66.0 in | Wt 237.0 lb

## 2018-06-07 DIAGNOSIS — K2270 Barrett's esophagus without dysplasia: Secondary | ICD-10-CM

## 2018-06-07 DIAGNOSIS — Z1211 Encounter for screening for malignant neoplasm of colon: Secondary | ICD-10-CM | POA: Diagnosis not present

## 2018-06-07 DIAGNOSIS — K219 Gastro-esophageal reflux disease without esophagitis: Secondary | ICD-10-CM

## 2018-06-07 MED ORDER — SUPREP BOWEL PREP KIT 17.5-3.13-1.6 GM/177ML PO SOLN: 1.0000 | ORAL | 0 refills | Status: DC

## 2018-06-07 MED ORDER — ONDANSETRON 4 MG PO TBDP: ORAL_TABLET | ORAL | 0 refills | Status: DC

## 2018-06-07 NOTE — Patient Instructions (Signed)
Normal BMI (Body Mass Index- based on height and weight) is between 19 and 25. Your BMI today is Body mass index is 38.25 kg/m. Marland Kitchen Please consider follow up  regarding your BMI with your Primary Care Provider.  You have been scheduled for a colonoscopy. Please follow written instructions given to you at your visit today.  Please pick up your prep supplies at the pharmacy within the next 1-3 days. If you use inhalers (even only as needed), please bring them with you on the day of your procedure. Your physician has requested that you go to www.startemmi.com and enter the access code given to you at your visit today. This web site gives a general overview about your procedure. However, you should still follow specific instructions given to you by our office regarding your preparation for the procedure.  We have sent the following prescriptions to your mail in pharmacy:  We have sent the following medications to your pharmacy for you to pick up at your convenience: Zofran

## 2018-06-07 NOTE — Progress Notes (Signed)
HISTORY OF PRESENT ILLNESS:  Dawn Reynolds is a wasn't 52 y.o. female , cardiac nurse at [employer], who sent today by her primary care provider Dr. Janett Billow Reynolds with chief complaints of chronic GERD with possible Barrett's esophagus and the need for screening colonoscopy. Patient reports having undergone colonoscopy and upper endoscopy greater than 10 years ago in [place]. She doesn't recall any particular abnormalities on colonoscopy. However, upper endoscopy was said to show Barrett's esophagus. She feels she may be overdue for follow-up if that is the case. She also reports a history of what sounds like esophageal stricture requiring esophageal dilation. Currently no problems with dysphagia. She is maintained on omeprazole 20 mg daily with good control of symptoms. Occasional belching but no pyrosis. Bowel habits alternate but stable. She does report colon cancer in an uncle but no first-degree relatives. She is status post cholecystectomy earlier this year. she has also had prior hysterectomy. Review of outside blood work from 04/06/2018 finds a unremarkable, hence of metabolic panel except for glucose of 103. Normal CBC with hemoglobin 14.9. Review of outside radiology finds abdominal ultrasound performed 02/20/2018 with contracted gallbladder with multiple gallstones and slight wall thickening.  REVIEW OF SYSTEMS:  All non-GI ROS negative was otherwise stated in the history of present illness except for arthritis, back pain, visual change, fatigue, muscle cramps, ankle swelling  Past Medical History:  Diagnosis Date  . Anemia    hx of  . Arthritis   . Chicken pox   . Depression    hx of in past situational  . Fibromyalgia   . GERD (gastroesophageal reflux disease)   . Headache    hx of migraines  . History of kidney stones    1996  . Hypertension     Past Surgical History:  Procedure Laterality Date  . ABDOMINAL HYSTERECTOMY    . CHOLECYSTECTOMY     03-31-18 Dawn Reynolds  .  CHOLECYSTECTOMY N/A 03/31/2018   Procedure: LAPAROSCOPIC CHOLECYSTECTOMY;  Surgeon: Dawn Ok, MD;  Location: [facility];  Service: General;  Laterality: N/A;  . endoplantar fasciotomy    . GALLBLADDER SURGERY  03/31/2018  . KNEE ARTHROSCOPY     left knee 2012  . LAPAROSCOPIC CHOLECYSTECTOMY    . TONSILLECTOMY     1999    Social History Dawn Reynolds  reports that she has never smoked. She has never used smokeless tobacco. She reports that she drinks alcohol. She reports that she does not use drugs.  family history includes Colon cancer in her paternal uncle; Diabetes in her father; Heart disease in her father and mother; Irritable bowel syndrome in her father; Kidney disease in her father; Liver cancer in her paternal uncle; Mitral valve prolapse in her mother; Pancreatic cancer in her maternal aunt; Prostate cancer in her father.  Allergies  Allergen Reactions  . Aspartame Other (See Comments)    Headaches.  . Demerol [Meperidine] Nausea And Vomiting       PHYSICAL EXAMINATION: Vital signs: BP 120/78 (Cuff Size: Large)   Pulse 68   Ht 5\' 6"  (1.676 m)   Wt 237 lb (107.5 kg)   LMP 06/09/2015 (Within Weeks)   BMI 38.25 kg/m   Constitutional: generally well-appearing, no acute distress Psychiatric: alert and oriented x3, cooperative Eyes: extraocular movements intact, anicteric, conjunctiva pink Mouth: oral pharynx moist, no lesions Neck: supple no lymphadenopathy Cardiovascular: heart regular rate and rhythm, no murmur Lungs: clear to auscultation bilaterally Abdomen: soft, nontender, nondistended, no obvious ascites, no peritoneal signs, normal bowel  sounds, no organomegaly Rectal:deferred until colonoscopy Extremities: no clubbing, cyanosis, or lower extremity edema bilaterally Skin: no lesions on visible extremities Neuro: No focal deficits. Cranial nerves intact  ASSESSMENT:  #1. Chronic GERD. No active symptoms on PPI #2. History of peptic stricture with  esophageal dilation elsewhere. No recurrent dysphagia #3. Reported history of Barrett's esophagus. Needs clarified with endoscopy #4. Colon cancer screening. Appropriate candidate without contraindication   PLAN:  #1. Reflux precautions #2. Continue omeprazole #3. Upper endoscopy with possible biopsies.The nature of the procedure, as well as the risks, benefits, and alternatives were carefully and thoroughly reviewed with the patient. Ample time for discussion and questions allowed. The patient understood, was satisfied, and agreed to proceed. #4. Screening colonoscopy.The nature of the procedure, as well as the risks, benefits, and alternatives were carefully and thoroughly reviewed with the patient. Ample time for discussion and questions allowed. The patient understood, was satisfied, and agreed to proceed.  A copy of this consultation note has been sent to Dawn Reynolds

## 2018-07-06 ENCOUNTER — Other Ambulatory Visit: Payer: Self-pay

## 2018-07-06 ENCOUNTER — Emergency Department (HOSPITAL_COMMUNITY)
Admission: EM | Admit: 2018-07-06 | Discharge: 2018-07-06 | Disposition: A | Discharge status: Home / Self Care | Payer: 59 | Attending: Emergency Medicine | Admitting: Emergency Medicine

## 2018-07-06 ENCOUNTER — Encounter (HOSPITAL_COMMUNITY): Payer: Self-pay | Admitting: Emergency Medicine

## 2018-07-06 DIAGNOSIS — Z79899 Other long term (current) drug therapy: Secondary | ICD-10-CM | POA: Diagnosis not present

## 2018-07-06 DIAGNOSIS — I10 Essential (primary) hypertension: Secondary | ICD-10-CM | POA: Insufficient documentation

## 2018-07-06 DIAGNOSIS — M549 Dorsalgia, unspecified: Secondary | ICD-10-CM | POA: Diagnosis present

## 2018-07-06 DIAGNOSIS — M5440 Lumbago with sciatica, unspecified side: Secondary | ICD-10-CM | POA: Diagnosis not present

## 2018-07-06 DIAGNOSIS — M791 Myalgia, unspecified site: Secondary | ICD-10-CM | POA: Diagnosis not present

## 2018-07-06 DIAGNOSIS — M5442 Lumbago with sciatica, left side: Secondary | ICD-10-CM | POA: Diagnosis not present

## 2018-07-06 MED ORDER — KETOROLAC TROMETHAMINE 15 MG/ML IJ SOLN
15.0000 mg | Freq: Once | INTRAMUSCULAR | Status: AC
  Administered 2018-07-06: 15 mg via INTRAMUSCULAR
  Filled 2018-07-06: qty 1

## 2018-07-06 MED ORDER — PREDNISONE 50 MG PO TABS: 50.0000 mg | ORAL_TABLET | Freq: Every day | ORAL | 0 refills | Status: AC

## 2018-07-06 MED ORDER — PREDNISONE 10 MG PO TABS: 50.0000 mg | ORAL_TABLET | Freq: Every day | ORAL | 0 refills | Status: DC

## 2018-07-06 MED ORDER — METHOCARBAMOL 500 MG PO TABS
750.0000 mg | ORAL_TABLET | Freq: Once | ORAL | Status: AC
  Administered 2018-07-06: 750 mg via ORAL
  Filled 2018-07-06: qty 2

## 2018-07-06 NOTE — ED Notes (Signed)
See provider assessment 

## 2018-07-06 NOTE — Discharge Instructions (Addendum)
Please go to employee health in the morning in order to be seen by a workers compensation provider. I have prescribed a steroid burst, please take for the next 4 days. If your symptoms worsen, you experience any bowel or bladder incontinence please return to the ED for reevaluation.

## 2018-07-06 NOTE — ED Triage Notes (Signed)
800 ibuprofen taken prior to check in

## 2018-07-06 NOTE — ED Triage Notes (Signed)
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 Sudden back pain that shoots down left leg.

## 2018-07-06 NOTE — ED Provider Notes (Signed)
[facility] DEPART[facility] Provider Note   CSN: [id number] Arrival date & time: 07/06/18  1620     History   Chief Complaint Chief Complaint  Patient presents with  . Back Pain    HPI Dawn Reynolds Comment is a 52 y.o. female.  52 y/o female with a PMH of HTN, Fibromyalgia, Arthritis and GERD presents to the ED with a chief complaint of back pain x couple of hours. 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 She states the pain is worse with twisting and movement. Patient took 800 mg ibuprofen while on the floor and apply a heating pad to the area. She was also given toradol while waiting to be seen. She reports some relieve in symptoms. She denies any bowel or bladder incontinence, abdominal pain, chest pain or other complaints.      Past Medical History:  Diagnosis Date  . Anemia    hx of  . Arthritis   . Chicken pox   . Depression    hx of in past situational  . Fibromyalgia   . GERD (gastroesophageal reflux disease)   . Headache    hx of migraines  . History of kidney stones    1996  . Hypertension     Patient Active Problem List   Diagnosis Date Noted  . Obesity (BMI 35.0-39.9 without comorbidity) 04/06/2018  . High triglycerides 04/06/2018  . Chest pain 07/09/2017  . Fibromyalgia 07/09/2017  . Hypertension 07/09/2017    Past Surgical History:  Procedure Laterality Date  . ABDOMINAL HYSTERECTOMY    . CHOLECYSTECTOMY     03-31-18 [name]  . CHOLECYSTECTOMY N/A 03/31/2018   Procedure: LAPAROSCOPIC CHOLECYSTECTOMY;  Surgeon: Ralene Ok, MD;  Location: [facility];  Service: General;  Laterality: N/A;  . endoplantar fasciotomy    . GALLBLADDER SURGERY  03/31/2018  . KNEE ARTHROSCOPY     left knee 2012  . LAPAROSCOPIC CHOLECYSTECTOMY    . TONSILLECTOMY     1999     OB History   None      Home Medications     Prior to Admission medications   Medication Sig Start Date End Date Taking? Authorizing Provider  amLODipine (NORVASC) 10 MG tablet Take 1 tablet (10 mg total) by mouth daily. 04/13/18   Copland, Gay Filler, MD  Ascorbic Acid (VITAMIN C) 1000 MG tablet Take 1,000 mg by mouth daily.    [provider]  atorvastatin (LIPITOR) 20 MG tablet Take 1 tablet (20 mg total) by mouth daily. Patient not taking: Reported on 06/07/2018 04/13/18   Copland, Gay Filler, MD  B Complex Vitamins (B COMPLEX 1 PO) Take 1 tablet by mouth daily.     [provider]  BIOTIN PO Take 1 capsule by mouth daily.    [provider]  CALCIUM PO Take 1 tablet by mouth daily.    [provider]  cyclobenzaprine (FLEXERIL) 10 MG tablet Take 10 mg by mouth 3 (three) times daily as needed for muscle spasms.     [provider]  DULoxetine (CYMBALTA) 60 MG capsule Take 1 capsule (60 mg total) by mouth 2 (two) times daily. 04/06/18   Copland, Gay Filler, MD  Flaxseed, Linseed, (FLAXSEED OIL PO) Take 1 capsule by mouth daily.    [provider]  hydrochlorothiazide (MICROZIDE) 12.5 MG capsule Take 1  capsule (12.5 mg total) by mouth daily. 04/13/18   Copland, Gay Filler, MD  ibuprofen (ADVIL,MOTRIN) 800 MG tablet Take 800 mg by mouth every 8 (eight) hours as needed for moderate pain.    [provider]  MEGARED OMEGA-3 KRILL OIL PO Take 1 capsule by mouth daily.    [provider]  meloxicam (MOBIC) 15 MG tablet Take 1 tablet (15 mg total) by mouth daily. 04/13/18   Copland, Gay Filler, MD  nitrofurantoin, macrocrystal-monohydrate, (MACROBID) 100 MG capsule Take 1 capsule daily as needed following intercourse to prevent UTI 04/06/18   Copland, Gay Filler, MD  omeprazole (PRILOSEC) 20 MG capsule Take 1 capsule (20 mg total) by mouth daily. 04/13/18   Copland, Gay Filler, MD  ondansetron (ZOFRAN ODT) 4 MG disintegrating tablet Take 1 tab 20 minutes before each prep 06/07/18   Irene Shipper, MD  oxyCODONE-acetaminophen (PERCOCET/ROXICET) 5-325 MG tablet Take 1 or 2 at bedtime as needed for pain 04/20/18   Copland, Gay Filler, MD  predniSONE (DELTASONE) 50 MG tablet Take 1 tablet (50 mg total) by mouth daily for 4 days. 07/06/18 07/10/18  Janeece Fitting, PA-C  SUPREP BOWEL PREP KIT 17.5-3.13-1.6 GM/177ML SOLN Take 1 kit by mouth as directed. 06/07/18   Irene Shipper, MD    Family History Family History  Problem Relation Age of Onset  . Heart disease Father   . Prostate cancer Father   . Diabetes Father   . Irritable bowel syndrome Father   . Kidney disease Father   . Mitral valve prolapse Mother   . Heart disease Mother   . Pancreatic cancer Maternal Aunt   . Liver cancer Paternal Uncle   . Colon cancer Paternal Uncle     Social History Social History   Tobacco Use  . Smoking status: Never Smoker  . Smokeless tobacco: Never Used  Substance Use Topics  . Alcohol use: Yes    Comment: occ  . Drug use: No     Allergies   Aspartame and Demerol [meperidine]   Review of Systems Review of Systems  Cardiovascular: Negative for chest pain.  Genitourinary: Negative for dysuria and flank pain.  Musculoskeletal: Positive for back pain, gait problem and myalgias.  All other systems reviewed and are negative.    Physical Exam Updated Vital Signs BP (!) 154/100   Pulse 81   Temp 98.3 F (36.8 C) (Oral)   Ht '5\' 6"'  (1.676 m)   Wt 104.3 kg   LMP 06/09/2015 (Within Weeks)   SpO2 96%   BMI 37.12 kg/m   Physical Exam  Constitutional: She is oriented to person, place, and time. She appears well-developed and well-nourished.  HENT:  Head: Normocephalic and atraumatic.  Neck: Normal range of motion. Neck supple.  Cardiovascular: Normal heart sounds.  Pulmonary/Chest: Effort normal and breath sounds normal. She has no wheezes.  Abdominal: Soft.  Musculoskeletal: She exhibits tenderness.       Lumbar back: She exhibits tenderness, pain and spasm. She exhibits no  swelling, no edema and no deformity.       Back:  Paraspinal tenderness with palpation of left lower quadrant radiating to left buttock.   Neurological: She is alert and oriented to person, place, and time. Gait normal.  Reflex Scores:      Patellar reflexes are 2+ on the right side and 2+ on the left side. RLE:  KF,KE 5/5 strength HE,HF 5/5 strength LLE: KF, KE 5/5 strength, HE, HF 4/5 strength due to pain.  Tenderness to palpation along left buttock region.  Pulses present.   Skin: Skin is warm and dry.  Nursing note and vitals reviewed.    ED Treatments / Results  Labs (all labs ordered are listed, but only abnormal results are displayed) Labs Reviewed - No data to display  EKG None  Radiology No results found.  Procedures Procedures (including critical care time)  Medications Ordered in ED Medications  ketorolac (TORADOL) 15 MG/ML injection 15 mg (15 mg Intramuscular Given 07/06/18 1652)  methocarbamol (ROBAXIN) tablet 750 mg (750 mg Oral Given 07/06/18 1746)     Initial Impression / Assessment and Plan / ED Course  I have reviewed the triage vital signs and the nursing notes.  Pertinent labs & imaging results that were available during my care of the patient were reviewed by me and considered in my medical decision making (see chart for details).     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  No bowel or bladder incontinence. examination there is antalgic gait present, he has weaker strength on hip flexion 4 out of 5.  Telemetry reflexes are present.  At this time I have advised patient she needs to follow-up with employee health in order to file this as a Worker's Comp. injury.  Given patient here Toradol along with Robaxin to help improve her symptoms, patient states she is got some relief in symptoms.  We will send  patient home with a short steroid burst, follow-up with employee health tomorrow morning.  Patient understands and agrees with plan patient stable for discharge.  Return precautions provided.  Final Clinical Impressions(s) / ED Diagnoses   Final diagnoses:  Acute left-sided low back pain with sciatica, sciatica laterality unspecified    ED Discharge Orders         Ordered    predniSONE (DELTASONE) 10 MG tablet  Daily,   Status:  Discontinued     07/06/18 1748    predniSONE (DELTASONE) 50 MG tablet  Daily     07/06/18 1750           Janeece Fitting, PA-C 07/06/18 1805    Daleen Bo, MD 07/07/18 1510

## 2018-07-07 ENCOUNTER — Encounter: Payer: Self-pay | Admitting: Internal Medicine

## 2018-07-11 ENCOUNTER — Other Ambulatory Visit: Payer: Self-pay | Admitting: Occupational Medicine

## 2018-07-11 ENCOUNTER — Ambulatory Visit: Payer: Self-pay

## 2018-07-11 DIAGNOSIS — M545 Low back pain: Secondary | ICD-10-CM

## 2018-07-20 ENCOUNTER — Encounter: Payer: Self-pay | Admitting: Internal Medicine

## 2018-07-20 ENCOUNTER — Ambulatory Visit (AMBULATORY_SURGERY_CENTER): Payer: 59 | Admitting: Internal Medicine

## 2018-07-20 VITALS — BP 109/59 | HR 54 | Temp 98.4°F | Resp 16 | Ht 66.0 in | Wt 237.0 lb

## 2018-07-20 DIAGNOSIS — D122 Benign neoplasm of ascending colon: Secondary | ICD-10-CM | POA: Diagnosis not present

## 2018-07-20 DIAGNOSIS — E669 Obesity, unspecified: Secondary | ICD-10-CM | POA: Diagnosis not present

## 2018-07-20 DIAGNOSIS — K219 Gastro-esophageal reflux disease without esophagitis: Secondary | ICD-10-CM | POA: Diagnosis not present

## 2018-07-20 DIAGNOSIS — Z1211 Encounter for screening for malignant neoplasm of colon: Secondary | ICD-10-CM

## 2018-07-20 DIAGNOSIS — I10 Essential (primary) hypertension: Secondary | ICD-10-CM | POA: Diagnosis not present

## 2018-07-20 DIAGNOSIS — K2270 Barrett's esophagus without dysplasia: Secondary | ICD-10-CM

## 2018-07-20 DIAGNOSIS — M797 Fibromyalgia: Secondary | ICD-10-CM | POA: Diagnosis not present

## 2018-07-20 MED ORDER — SODIUM CHLORIDE 0.9 % IV SOLN: 500.0000 mL | Freq: Once | INTRAVENOUS | Status: DC

## 2018-07-20 NOTE — Progress Notes (Signed)
Pt did this for prep- 3 doses of Miralax, 4 Dulcolax tablets and bottle of Magnesium Citrate.  Dr. Henrene Pastor made aware

## 2018-07-20 NOTE — Patient Instructions (Signed)
Please read handouts on Polyps and Diverticulosis. Continue present medications.    YOU HAD AN ENDOSCOPIC PROCEDURE TODAY AT [facility]:   Refer to the procedure report that was given to you for any specific questions about what was found during the examination.  If the procedure report does not answer your questions, please call your gastroenterologist to clarify.  If you requested that your care partner not be given the details of your procedure findings, then the procedure report has been included in a sealed envelope for you to review at your convenience later.  YOU SHOULD EXPECT: Some feelings of bloating in the abdomen. Passage of more gas than usual.  Walking can help get rid of the air that was put into your GI tract during the procedure and reduce the bloating. If you had a lower endoscopy (such as a colonoscopy or flexible sigmoidoscopy) you may notice spotting of blood in your stool or on the toilet paper. If you underwent a bowel prep for your procedure, you may not have a normal bowel movement for a few days.  Please Note:  You might notice some irritation and congestion in your nose or some drainage.  This is from the oxygen used during your procedure.  There is no need for concern and it should clear up in a day or so.  SYMPTOMS TO REPORT IMMEDIATELY:   Following lower endoscopy (colonoscopy or flexible sigmoidoscopy):  Excessive amounts of blood in the stool  Significant tenderness or worsening of abdominal pains  Swelling of the abdomen that is new, acute  Fever of 100F or higher   Following upper endoscopy (EGD)  Vomiting of blood or coffee ground material  New chest pain or pain under the shoulder blades  Painful or persistently difficult swallowing  New shortness of breath  Fever of 100F or higher  Black, tarry-looking stools  For urgent or emergent issues, a gastroenterologist can be reached at any hour by calling [phone].   DIET:  We do  recommend a small meal at first, but then you may proceed to your regular diet.  Drink plenty of fluids but you should avoid alcoholic beverages for 24 hours.  ACTIVITY:  You should plan to take it easy for the rest of today and you should NOT DRIVE or use heavy machinery until tomorrow (because of the sedation medicines used during the test).    FOLLOW UP: Our staff will call the number listed on your records the next business day following your procedure to check on you and address any questions or concerns that you may have regarding the information given to you following your procedure. If we do not reach you, we will leave a message.  However, if you are feeling well and you are not experiencing any problems, there is no need to return our call.  We will assume that you have returned to your regular daily activities without incident.  If any biopsies were taken you will be contacted by phone or by letter within the next 1-3 weeks.  Please call us at [phone] if you have not heard about the biopsies in 3 weeks.    SIGNATURES/CONFIDENTIALITY: You and/or your care partner have signed paperwork which will be entered into your electronic medical record.  These signatures attest to the fact that that the information above on your After Visit Summary has been reviewed and is understood.  Full responsibility of the confidentiality of this discharge information lies with you and/or your  care-partner.

## 2018-07-20 NOTE — Progress Notes (Signed)
To recovery, report to RN, VSS. 

## 2018-07-20 NOTE — Op Note (Signed)
[facility] Patient Name: Dawn Reynolds Procedure Date: 07/20/2018 1:29 PM MRN: [id number] Endoscopist: Docia Chuck. Henrene Pastor , MD Age: 52 Referring MD:  Date of Birth: [birth year] Gender: Female Account #: [id number] Procedure:                Colonoscopy, With cold snare polypectomy x 1 Indications:              Screening for colorectal malignant neoplasm. The                            patient reports negative examination in [place] 10+                            years ago Medicines:                Monitored Anesthesia Care Procedure:                Pre-Anesthesia Assessment:                           - Prior to the procedure, a History and Physical                            was performed, and patient medications and                            allergies were reviewed. The patient's tolerance of                            previous anesthesia was also reviewed. The risks                            and benefits of the procedure and the sedation                            options and risks were discussed with the patient.                            All questions were answered, and informed consent                            was obtained. Prior Anticoagulants: The patient has                            taken no previous anticoagulant or antiplatelet                            agents. ASA Grade Assessment: II - A patient with                            mild systemic disease. After reviewing the risks                            and benefits, the patient was deemed in  satisfactory condition to undergo the procedure.                           After obtaining informed consent, the colonoscope                            was passed under direct vision. Throughout the                            procedure, the patient's blood pressure, pulse, and                            oxygen saturations were monitored continuously. The                            Colonoscope was  introduced through the anus and                            advanced to the the cecum, identified by                            appendiceal orifice and ileocecal valve. The                            ileocecal valve, appendiceal orifice, and rectum                            were photographed. The quality of the bowel                            preparation was adequate to identify polyps. The                            colonoscopy was performed WITH DIFFICULTY SECONDARY                            TO REDUNDANT COLON AND BODY HABITUS. The patient                            tolerated the procedure well. The bowel preparation                            used was magnesium citrate, MiraLAX, Dulcolax                            (patient initiated preparation after not tolerating                            split prep). Scope In: 1:33:53 PM Scope Out: 2:19:43 PM Scope Withdrawal Time: 0 hours 17 minutes 3 seconds  Total Procedure Duration: 0 hours 45 minutes 50 seconds  Findings:                 A 6 mm polyp was found in the ascending colon. The  polyp was sessile. The polyp was removed with a                            cold snare. Resection and retrieval were complete.                           Multiple small and large-mouthed diverticula were                            found in the transverse colon and right colon.                           The exam was otherwise without abnormality on                            direct and retroflexion views. Complications:            No immediate complications. Estimated blood loss:                            None. Estimated Blood Loss:     Estimated blood loss: none. Impression:               - One 6 mm polyp in the ascending colon, removed                            with a cold snare. Resected and retrieved.                           - Diverticulosis in the transverse colon and in the                            right colon.                            - The examination was otherwise normal on direct                            and retroflexion views.                           - Extremely redundant colon Recommendation:           - Repeat colonoscopy in 5-10 years for                            surveillance, Pending pathology results.                           - Patient has a contact number available for                            emergencies. The signs and symptoms of potential                            delayed complications were discussed with the  patient. Return to normal activities tomorrow.                            Written discharge instructions were provided to the                            patient.                           - Resume previous diet.                           - Continue present medications.                           - Await pathology results.                           - EGD today. Please see report Docia Chuck. Henrene Pastor, MD 07/20/2018 2:32:52 PM This report has been signed electronically.

## 2018-07-20 NOTE — Op Note (Signed)
[facility] Patient Name: Dawn Reynolds Procedure Date: 07/20/2018 1:28 PM MRN: [id number] Endoscopist: Docia Chuck. Henrene Pastor , MD Age: 52 Referring MD:  Date of Birth: 07-29-1966 Gender: Female Account #: [id number] Procedure:                Upper GI endoscopy Indications:              Esophageal reflux; reports of Barrett's esophagus                            on endoscopy elsewhere (no report, per patient)                            remotely. Needs clarification Medicines:                Monitored Anesthesia Care Procedure:                Pre-Anesthesia Assessment:                           - Prior to the procedure, a History and Physical                            was performed, and patient medications and                            allergies were reviewed. The patient's tolerance of                            previous anesthesia was also reviewed. The risks                            and benefits of the procedure and the sedation                            options and risks were discussed with the patient.                            All questions were answered, and informed consent                            was obtained. Prior Anticoagulants: The patient has                            taken no previous anticoagulant or antiplatelet                            agents. ASA Grade Assessment: II - A patient with                            mild systemic disease. After reviewing the risks                            and benefits, the patient was deemed in  satisfactory condition to undergo the procedure.                           After obtaining informed consent, the endoscope was                            passed under direct vision. Throughout the                            procedure, the patient's blood pressure, pulse, and                            oxygen saturations were monitored continuously. The                            Endoscope was introduced  through the mouth, and                            advanced to the second part of duodenum. The upper                            GI endoscopy was accomplished without difficulty.                            The patient tolerated the procedure well. Scope In: Scope Out: Findings:                 The esophagus was normal. No Barrett's esophagus.                           The stomach was normal. Small hiatal hernia.                           The examined duodenum was normal.                           The cardia and gastric fundus were normal on                            retroflexion. Complications:            No immediate complications. Estimated Blood Loss:     Estimated blood loss: none. Impression:               - Normal esophagus. No Barrett's esophagus.                           - Normal stomach.                           - Normal examined duodenum.                           - No specimens collected. Recommendation:           - Patient has a contact number available for  emergencies. The signs and symptoms of potential                            delayed complications were discussed with the                            patient. Return to normal activities tomorrow.                            Written discharge instructions were provided to the                            patient.                           - Resume previous diet.                           - Continue present medications. Docia Chuck. Henrene Pastor, MD 07/20/2018 2:35:49 PM This report has been signed electronically.

## 2018-07-20 NOTE — Progress Notes (Signed)
Called to room to assist during endoscopic procedure.  Patient ID and intended procedure confirmed with present staff. Received instructions for my participation in the procedure from the performing physician.  

## 2018-07-21 ENCOUNTER — Telehealth: Payer: Self-pay

## 2018-07-21 MED FILL — OMEPRAZOLE 20 MG CPDR: 20 | 90 days supply | Qty: 90 | Fill #1

## 2018-07-21 MED FILL — NITROFURANTOIN MONO-MCR 100: 100 | 30 days supply | Qty: 30 | Fill #0

## 2018-07-21 MED FILL — HYDROCHLOROTHIAZIDE 12.5 MG: 12.5 | 90 days supply | Qty: 90 | Fill #1

## 2018-07-21 MED FILL — MELOXICAM 15 MG TABLET: 15 | 90 days supply | Qty: 90 | Fill #1

## 2018-07-21 MED FILL — DULoxetine HCL 60 MG CPEP: 60 | 90 days supply | Qty: 180 | Fill #1

## 2018-07-21 MED FILL — AMLODIPINE BESYLATE 10 MG T: 10 | 90 days supply | Qty: 90 | Fill #1

## 2018-07-21 MED FILL — METHOCARBAMOL 500 MG TABS: 500 | 30 days supply | Qty: 60 | Fill #0

## 2018-07-21 MED FILL — METHYLPREDNISOLONE 4 MG TAB: 4 | 6 days supply | Qty: 21 | Fill #0

## 2018-07-21 NOTE — Telephone Encounter (Signed)
  Follow up Call-  Call back number 07/20/2018  Post procedure Call Back phone  # [phone]  Permission to leave phone message Yes  Some recent data might be hidden     Patient questions:  Do you have a fever, pain , or abdominal swelling? No. Pain Score  0 *  Have you tolerated food without any problems? Yes.    Have you been able to return to your normal activities? Yes.    Do you have any questions about your discharge instructions: Diet   No. Medications  No. Follow up visit  No.  Do you have questions or concerns about your Care? No.  Actions: * If pain score is 4 or above: No action needed, pain <4.

## 2018-07-24 ENCOUNTER — Other Ambulatory Visit (HOSPITAL_COMMUNITY): Payer: Self-pay | Admitting: Orthopedic Surgery

## 2018-07-24 DIAGNOSIS — M5416 Radiculopathy, lumbar region: Secondary | ICD-10-CM

## 2018-07-24 DIAGNOSIS — M545 Low back pain: Secondary | ICD-10-CM

## 2018-07-25 ENCOUNTER — Encounter: Payer: Self-pay | Admitting: Internal Medicine

## 2018-07-27 ENCOUNTER — Ambulatory Visit (HOSPITAL_COMMUNITY): Admission: RE | Admit: 2018-07-27 | Payer: 59 | Source: Ambulatory Visit

## 2018-07-29 ENCOUNTER — Other Ambulatory Visit: Payer: Self-pay

## 2018-07-29 ENCOUNTER — Emergency Department (HOSPITAL_COMMUNITY)
Admission: EM | Admit: 2018-07-29 | Discharge: 2018-07-29 | Disposition: A | Discharge status: Home / Self Care | Payer: 59 | Attending: Emergency Medicine | Admitting: Emergency Medicine

## 2018-07-29 ENCOUNTER — Ambulatory Visit (HOSPITAL_BASED_OUTPATIENT_CLINIC_OR_DEPARTMENT_OTHER)
Admission: RE | Admit: 2018-07-29 | Discharge: 2018-07-29 | Disposition: A | Discharge status: Home / Self Care | Payer: 59 | Source: Ambulatory Visit | Attending: Emergency Medicine | Admitting: Emergency Medicine

## 2018-07-29 ENCOUNTER — Encounter (HOSPITAL_COMMUNITY): Payer: Self-pay | Admitting: Emergency Medicine

## 2018-07-29 ENCOUNTER — Emergency Department (HOSPITAL_COMMUNITY): Payer: 59

## 2018-07-29 ENCOUNTER — Other Ambulatory Visit (HOSPITAL_COMMUNITY): Admit: 2018-07-29 | Payer: 59 | Source: Other Acute Inpatient Hospital | Admitting: Emergency Medicine

## 2018-07-29 DIAGNOSIS — R200 Anesthesia of skin: Secondary | ICD-10-CM | POA: Insufficient documentation

## 2018-07-29 DIAGNOSIS — I10 Essential (primary) hypertension: Secondary | ICD-10-CM | POA: Diagnosis not present

## 2018-07-29 DIAGNOSIS — Z79899 Other long term (current) drug therapy: Secondary | ICD-10-CM | POA: Insufficient documentation

## 2018-07-29 DIAGNOSIS — M7130 Other bursal cyst, unspecified site: Secondary | ICD-10-CM | POA: Diagnosis not present

## 2018-07-29 DIAGNOSIS — R209 Unspecified disturbances of skin sensation: Secondary | ICD-10-CM | POA: Diagnosis not present

## 2018-07-29 DIAGNOSIS — M7120 Synovial cyst of popliteal space [Baker], unspecified knee: Secondary | ICD-10-CM | POA: Insufficient documentation

## 2018-07-29 DIAGNOSIS — M79609 Pain in unspecified limb: Secondary | ICD-10-CM

## 2018-07-29 DIAGNOSIS — M25462 Effusion, left knee: Secondary | ICD-10-CM | POA: Diagnosis not present

## 2018-07-29 DIAGNOSIS — M79604 Pain in right leg: Secondary | ICD-10-CM | POA: Diagnosis not present

## 2018-07-29 DIAGNOSIS — M25461 Effusion, right knee: Secondary | ICD-10-CM | POA: Diagnosis not present

## 2018-07-29 LAB — CBC
HEMATOCRIT: 44.1 % (ref 36.0–46.0)
Hemoglobin: 14.4 g/dL (ref 12.0–15.0)
MCH: 28.9 pg (ref 26.0–34.0)
MCHC: 32.7 g/dL (ref 30.0–36.0)
MCV: 88.4 fL (ref 78.0–100.0)
Platelets: 292 10*3/uL (ref 150–400)
RBC: 4.99 MIL/uL (ref 3.87–5.11)
RDW: 15.2 % (ref 11.5–15.5)
WBC: 13.3 10*3/uL — ABNORMAL HIGH (ref 4.0–10.5)

## 2018-07-29 LAB — BASIC METABOLIC PANEL
ANION GAP: 13 (ref 5–15)
BUN: 23 mg/dL — ABNORMAL HIGH (ref 6–20)
CHLORIDE: 103 mmol/L (ref 98–111)
CO2: 22 mmol/L (ref 22–32)
Calcium: 8.8 mg/dL — ABNORMAL LOW (ref 8.9–10.3)
Creatinine, Ser: 0.99 mg/dL (ref 0.44–1.00)
GFR calc Af Amer: >60 mL/min (ref >60)
GFR calc non Af Amer: >60 mL/min (ref >60)
Glucose, Bld: 107 mg/dL — ABNORMAL HIGH (ref 70–99)
POTASSIUM: 3.2 mmol/L — AB (ref 3.5–5.1)
Sodium: 138 mmol/L (ref 135–145)

## 2018-07-29 MED ORDER — ENOXAPARIN SODIUM 120 MG/0.8ML ~~LOC~~ SOLN
1.0000 mg/kg | Freq: Once | SUBCUTANEOUS | Status: AC
  Administered 2018-07-29: 110 mg via SUBCUTANEOUS
  Filled 2018-07-29: qty 0.72

## 2018-07-29 MED ORDER — LIDOCAINE 5 % EX PTCH
1.0000 | MEDICATED_PATCH | CUTANEOUS | Status: DC
  Administered 2018-07-29: 1 via TRANSDERMAL
  Filled 2018-07-29: qty 1

## 2018-07-29 MED ORDER — LIDOCAINE 5 % EX PTCH: 1.0000 | MEDICATED_PATCH | CUTANEOUS | 0 refills | Status: DC

## 2018-07-29 MED ORDER — IOPAMIDOL (ISOVUE-370) INJECTION 76%
INTRAVENOUS | Status: AC
  Administered 2018-07-29: 100 mL
  Filled 2018-07-29: qty 100

## 2018-07-29 MED ORDER — KETOROLAC TROMETHAMINE 30 MG/ML IJ SOLN
15.0000 mg | Freq: Once | INTRAMUSCULAR | Status: AC
  Administered 2018-07-29: 15 mg via INTRAVENOUS
  Filled 2018-07-29: qty 1

## 2018-07-29 MED ORDER — DICLOFENAC SODIUM ER 100 MG PO TB24: 100.0000 mg | ORAL_TABLET | Freq: Every day | ORAL | 0 refills | Status: DC

## 2018-07-29 NOTE — ED Provider Notes (Signed)
[facility] EMERGENCY DEPARTMENT Provider Note   CSN: [id number] Arrival date & time: 07/29/18  0441     History   Chief Complaint Chief Complaint  Patient presents with  . Leg Pain    HPI Dawn Reynolds Comment is a 52 y.o. female.  The history is provided by the patient.  Extremity Pain  This is a new problem. The current episode started 3 to 5 hours ago. The problem occurs constantly. The problem has not changed since onset.Pertinent negatives include no chest pain, no abdominal pain, no headaches and no shortness of breath. Nothing aggravates the symptoms. Nothing relieves the symptoms. Treatments tried: flexeril and percocet. The treatment provided no relief.  Patient with h/o HTN, fibromyalgia, arthritis and GERD who presents with acute RLE pain.  Pain is centered about the anterior and posterior knee pain (popliteal fossa).  Patient denies trauma, but states the leg felt colder to her that the left leg.   Temperature is back to normal at the time of the exam.  No CP no SOB.  F/c/r.    Past Medical History:  Diagnosis Date  . Anemia    hx of  . Arthritis   . Cataract    as a child  . Chicken pox   . Depression    hx of in past situational  . Fibromyalgia   . GERD (gastroesophageal reflux disease)   . Headache    hx of migraines  . History of kidney stones    1996  . Hypertension     Patient Active Problem List   Diagnosis Date Noted  . Obesity (BMI 35.0-39.9 without comorbidity) 04/06/2018  . High triglycerides 04/06/2018  . Chest pain 07/09/2017  . Fibromyalgia 07/09/2017  . Hypertension 07/09/2017    Past Surgical History:  Procedure Laterality Date  . ABDOMINAL HYSTERECTOMY    . CHOLECYSTECTOMY     03-31-18 [name]  . CHOLECYSTECTOMY N/A 03/31/2018   Procedure: LAPAROSCOPIC CHOLECYSTECTOMY;  Surgeon: Ralene Ok, MD;  Location: [facility];  Service: General;  Laterality: N/A;  . COLONOSCOPY    . endoplantar fasciotomy    . GALLBLADDER  SURGERY  03/31/2018  . KNEE ARTHROSCOPY     left knee 2012  . LAPAROSCOPIC CHOLECYSTECTOMY    . TONSILLECTOMY     1999  . UPPER GASTROINTESTINAL ENDOSCOPY       OB History   None      Home Medications    Prior to Admission medications   Medication Sig Start Date End Date Taking? Authorizing Provider  amLODipine (NORVASC) 10 MG tablet Take 1 tablet (10 mg total) by mouth daily. 04/13/18   Copland, Gay Filler, MD  Ascorbic Acid (VITAMIN C) 1000 MG tablet Take 1,000 mg by mouth daily.    [provider]  atorvastatin (LIPITOR) 20 MG tablet Take 1 tablet (20 mg total) by mouth daily. Patient not taking: Reported on 06/07/2018 04/13/18   Copland, Gay Filler, MD  B Complex Vitamins (B COMPLEX 1 PO) Take 1 tablet by mouth daily.     [provider]  BIOTIN PO Take 1 capsule by mouth daily.    [provider]  CALCIUM PO Take 1 tablet by mouth daily.    [provider]  cyclobenzaprine (FLEXERIL) 10 MG tablet Take 10 mg by mouth 3 (three) times daily as needed for muscle spasms.     [provider]  DULoxetine (CYMBALTA) 60 MG capsule Take 1 capsule (60 mg total) by mouth 2 (two) times  daily. 04/06/18   Copland, Gay Filler, MD  Flaxseed, Linseed, (FLAXSEED OIL PO) Take 1 capsule by mouth daily.    [provider]  hydrochlorothiazide (MICROZIDE) 12.5 MG capsule Take 1 capsule (12.5 mg total) by mouth daily. 04/13/18   Copland, Gay Filler, MD  ibuprofen (ADVIL,MOTRIN) 800 MG tablet Take 800 mg by mouth every 8 (eight) hours as needed for moderate pain.    [provider]  MEGARED OMEGA-3 KRILL OIL PO Take 1 capsule by mouth daily.    [provider]  meloxicam (MOBIC) 15 MG tablet Take 1 tablet (15 mg total) by mouth daily. 04/13/18   Copland, Gay Filler, MD  nitrofurantoin, macrocrystal-monohydrate, (MACROBID) 100 MG capsule Take 1 capsule daily as needed following intercourse to prevent UTI 04/06/18   Copland, Gay Filler, MD    omeprazole (PRILOSEC) 20 MG capsule Take 1 capsule (20 mg total) by mouth daily. 04/13/18   Copland, Gay Filler, MD    Family History Family History  Problem Relation Age of Onset  . Heart disease Father   . Prostate cancer Father   . Diabetes Father   . Irritable bowel syndrome Father   . Kidney disease Father   . Mitral valve prolapse Mother   . Heart disease Mother   . Pancreatic cancer Maternal Aunt   . Liver cancer Paternal Uncle   . Colon cancer Paternal Uncle   . Esophageal cancer Neg Hx   . Stomach cancer Neg Hx   . Rectal cancer Neg Hx     Social History Social History   Tobacco Use  . Smoking status: Never Smoker  . Smokeless tobacco: Never Used  Substance Use Topics  . Alcohol use: Yes    Comment: occ  . Drug use: No     Allergies   Aspartame and Demerol [meperidine]   Review of Systems Review of Systems  Constitutional: Negative for diaphoresis and fever.  Respiratory: Negative for chest tightness and shortness of breath.   Cardiovascular: Negative for chest pain, palpitations and leg swelling.  Gastrointestinal: Negative for abdominal pain and vomiting.  Musculoskeletal: Positive for arthralgias. Negative for neck pain.  Neurological: Negative for weakness, numbness and headaches.  All other systems reviewed and are negative.    Physical Exam Updated Vital Signs BP 119/77 (BP Location: Right Arm)   Pulse 83   Temp 98.5 F (36.9 C) (Oral)   Resp 20   LMP 06/09/2015 (Approximate)   SpO2 100%   Physical Exam  Constitutional: She is oriented to person, place, and time. She appears well-developed and well-nourished. No distress.  HENT:  Head: Normocephalic and atraumatic.  Mouth/Throat: No oropharyngeal exudate.  Eyes: Pupils are equal, round, and reactive to light. Conjunctivae are normal.  Neck: Normal range of motion. Neck supple.  Cardiovascular: Normal rate, regular rhythm, normal heart sounds and intact distal pulses.  Pulmonary/Chest:  Effort normal and breath sounds normal. No stridor. She has no wheezes. She has no rales.  Abdominal: Soft. Bowel sounds are normal. She exhibits no mass. There is no tenderness. There is no rebound and no guarding.  Musculoskeletal: Normal range of motion. She exhibits no edema or deformity.       Right knee: She exhibits normal range of motion, no swelling, no effusion, no ecchymosis, no deformity, no laceration, no erythema, normal alignment, no LCL laxity, normal patellar mobility, no bony tenderness, normal meniscus and no MCL laxity. No tenderness found. No medial joint line, no lateral joint line, no MCL, no LCL  and no patellar tendon tenderness noted.       Right ankle: Normal. Achilles tendon normal.       Right lower leg: She exhibits no bony tenderness, no swelling, no edema, no deformity and no laceration.       Right foot: There is normal range of motion, no tenderness, no bony tenderness, no swelling, normal capillary refill, no crepitus, no deformity and no laceration.  3+ dorsalis pedis pulse in the right foot cap refill < 2 sec to all digits of the right foot.  Palpable Baker's cyst  Neurological: She is alert and oriented to person, place, and time. She displays normal reflexes.  Skin: Skin is warm and dry. Capillary refill takes less than 2 seconds. No pallor.  Psychiatric: She has a normal mood and affect.     ED Treatments / Results  Labs (all labs ordered are listed, but only abnormal results are displayed) Results for orders placed or performed during the hospital encounter of 07/29/18  CBC  Result Value Ref Range   WBC 13.3 (H) 4.0 - 10.5 K/uL   RBC 4.99 3.87 - 5.11 MIL/uL   Hemoglobin 14.4 12.0 - 15.0 g/dL   HCT 44.1 36.0 - 46.0 %   MCV 88.4 78.0 - 100.0 fL   MCH 28.9 26.0 - 34.0 pg   MCHC 32.7 30.0 - 36.0 g/dL   RDW 15.2 11.5 - 15.5 %   Platelets 292 150 - 400 K/uL  Basic metabolic panel  Result Value Ref Range   Sodium 138 135 - 145 mmol/L   Potassium 3.2  (L) 3.5 - 5.1 mmol/L   Chloride 103 98 - 111 mmol/L   CO2 22 22 - 32 mmol/L   Glucose, Bld 107 (H) 70 - 99 mg/dL   BUN 23 (H) 6 - 20 mg/dL   Creatinine, Ser 0.99 0.44 - 1.00 mg/dL   Calcium 8.8 (L) 8.9 - 10.3 mg/dL   GFR calc non Af Amer >60 >60 mL/min   GFR calc Af Amer >60 >60 mL/min   Anion gap 13 5 - 15   Dg Lumbar Spine Complete  Result Date: 07/11/2018 CLINICAL DATA:  Low back injury moving a patient on 07/06/2018, persistent LEFT side pain EXAM: LUMBAR SPINE - COMPLETE 4+ VIEW COMPARISON:  None FINDINGS: 5 non-rib-bearing lumbar vertebra. Low normal osseous mineralization. Mild facet degenerative changes RIGHT L4-L5 and L5-S1. Vertebral body heights maintained. Disc space narrowing and endplate spur formation at L2-L3 and L3-L4. No fracture, subluxation or bone destruction. No spondylolysis. SI joints preserved. IMPRESSION: Degenerative disc and facet disease changes of the lumbar spine as above. No acute abnormalities. Electronically Signed   By: Lavonia Dana M.D.   On: 07/11/2018 09:31    Radiology No results found.  Procedures Procedures (including critical care time)  Medications Ordered in ED Medications  lidocaine (LIDODERM) 5 % 1 patch (has no administration in time range)  enoxaparin (LOVENOX) injection 110 mg (has no administration in time range)  ketorolac (TORADOL) 30 MG/ML injection 15 mg (15 mg Intravenous Given 07/29/18 0641)  iopamidol (ISOVUE-370) 76 % injection (100 mLs  Contrast Given 07/29/18 0707)    All labs and imaging results reviewed at length with patient and her husband with nurse present.     Final Clinical Impressions(s) / ED Diagnoses  I believe this patient is having symptoms related to the Baker's cyst that I palpated and was found on on CT, have 3+ dorsalis pedis.  Foot is definitively warm and is  NVI.  Will rule out DVT.  This was ordered.  Have advised follow up for baker's cysts with orthopedics and will add voltaren and lidoderm to patient's  home percocet.    Return for fevers >100.4 unrelieved by medication, shortness of breath, intractable vomiting, or diarrhea, Inability to tolerate liquids or food, cough, altered mental status or any concerns. No signs of systemic illness or infection. The patient is nontoxic-appearing on exam and vital signs are within normal limits.   I have reviewed the triage vital signs and the nursing notes. Pertinent labs &imaging results that were available during my care of the patient were reviewed by me and considered in my medical decision making (see chart for details).  After history, exam, and medical workup I feel the patient has been appropriately medically screened and is safe for discharge home. Pertinent diagnoses were discussed with the patient. Patient was given return precautions.    Shabreka Coulon, MD 07/29/18 [phone]

## 2018-07-29 NOTE — ED Notes (Signed)
Pt wheeled to waiting room to await transport for doppler study.

## 2018-07-29 NOTE — Progress Notes (Signed)
.  VASCULAR LAB PRELIMINARY  PRELIMINARY  PRELIMINARY  PRELIMINARY  Right lower extremity venous duplex completed.    Preliminary report:  There is no DVT or SVT noted in the right lower extremity. Large Baker's cyst noted in the right popliteal fossa.   Garwood Wentzell, RVT 07/29/2018, 11:04 AM

## 2018-07-29 NOTE — ED Triage Notes (Signed)
Pt states she began having right leg pain yesterday.  Unresponsive to percocet at home. Pt states she has tried heat with no relief. Pt states she awoke with feeling her right toes numb and her right leg "colder than the left"  Pt does have good dorsalis pedis pulse in triage.

## 2018-07-31 ENCOUNTER — Ambulatory Visit (HOSPITAL_COMMUNITY)
Admission: RE | Admit: 2018-07-31 | Discharge: 2018-07-31 | Disposition: A | Discharge status: Home / Self Care | Payer: 59 | Source: Ambulatory Visit | Attending: Orthopedic Surgery | Admitting: Orthopedic Surgery

## 2018-07-31 ENCOUNTER — Encounter: Payer: Self-pay | Admitting: Family Medicine

## 2018-07-31 ENCOUNTER — Ambulatory Visit (INDEPENDENT_AMBULATORY_CARE_PROVIDER_SITE_OTHER): Payer: 59 | Admitting: Family Medicine

## 2018-07-31 VITALS — BP 120/80 | HR 92 | Temp 98.4°F | Resp 16 | Ht 66.0 in | Wt 239.0 lb

## 2018-07-31 DIAGNOSIS — M5416 Radiculopathy, lumbar region: Secondary | ICD-10-CM | POA: Diagnosis present

## 2018-07-31 DIAGNOSIS — M4726 Other spondylosis with radiculopathy, lumbar region: Secondary | ICD-10-CM | POA: Insufficient documentation

## 2018-07-31 DIAGNOSIS — M79605 Pain in left leg: Secondary | ICD-10-CM

## 2018-07-31 DIAGNOSIS — M79604 Pain in right leg: Secondary | ICD-10-CM

## 2018-07-31 DIAGNOSIS — M545 Low back pain, unspecified: Secondary | ICD-10-CM

## 2018-07-31 DIAGNOSIS — M47816 Spondylosis without myelopathy or radiculopathy, lumbar region: Secondary | ICD-10-CM | POA: Diagnosis not present

## 2018-07-31 DIAGNOSIS — M5126 Other intervertebral disc displacement, lumbar region: Secondary | ICD-10-CM | POA: Diagnosis not present

## 2018-07-31 MED ORDER — OXYCODONE-ACETAMINOPHEN 5-325 MG PO TABS: 1.0000 | ORAL_TABLET | Freq: Three times a day (TID) | ORAL | 0 refills | Status: DC | PRN

## 2018-07-31 MED ORDER — CYCLOBENZAPRINE HCL 10 MG PO TABS: 10.0000 mg | ORAL_TABLET | Freq: Two times a day (BID) | ORAL | 1 refills | Status: DC | PRN

## 2018-07-31 MED FILL — CYCLOBENZAPRINE 10 MG TAB: 10 | 15 days supply | Qty: 30 | Fill #0

## 2018-07-31 MED FILL — OXYCODONE-ACETAMINOPHEN 5-3: 5-325 | 4 days supply | Qty: 12 | Fill #0

## 2018-07-31 NOTE — Progress Notes (Signed)
[facility] [address] [place], [place] [address] [phone]  Date:  07/31/2018   Name:  Dawn Reynolds   DOB:  [birth year]   MRN:  [id number]  PCP:  Darreld Mclean, MD    Chief Complaint: Follow-up (seen 07/29/18 for leg pain, right leg, still having pain, trouble walking)   History of Present Illness:  Dawn Reynolds is a 52 y.o. very pleasant female patient who presents with the following:  Following up from an ER visit from 9/21-  Patient with h/o HTN, fibromyalgia, arthritis and GERD who presents with acute RLE pain.  Pain is centered about the anterior and posterior knee pain (popliteal fossa).  Patient denies trauma, but states the leg felt colder to her that the left leg.   Temperature is back to normal at the time of the exam.  No CP no SOB.  F/c/r.  ////////////// I believe this patient is having symptoms related to the Baker's cyst that I palpated and was found on on CT, have 3+ dorsalis pedis.  Foot is definitively warm and is NVI.  Will rule out DVT.  This was ordered.  Have advised follow up for baker's cysts with orthopedics and will add voltaren and lidoderm to patient's home percocet.    Return for fevers >100.4 unrelieved by medication, shortness of breath, intractable vomiting, or diarrhea, Inability to tolerate liquids or food, cough, altered mental status or any concerns. No signs of systemic illness or infection. The patient is nontoxic-appearing on exam and vital signs are within normal limits.  I have reviewed the triage vital signs and the nursing notes. Pertinent labs &imaging results that were available during my care of the patient were reviewed by me and considered in my medical decision making (see chart for details). After history, exam, and medical workup I feel the patient has been appropriately medically screened and is safe for discharge home. Pertinent diagnoses were discussed with the  patient. Patient was given return precautions  She had a CT angiogram of her aorta and legs while in the ED: IMPRESSION: VASCULAR 1. No evidence of significant arterial occlusive disease or atherosclerosis involving the aorta, iliac arteries or bilateral lower extremities. 2. Bilateral tibial arteries are only adequately assessed to the proximal/mid calf level and are not well opacified distally due to timing of the contrast bolus relative to imaging. NON-VASCULAR Bilateral knee joint effusions, right greater than left, with associated bilateral popliteal fossa Baker's cysts.  She also had a doppler in the ER: Final Interpretation: Right: There is no evidence of deep vein thrombosis in the lower extremity.There is no evidence of superficial venous thrombosis. A cystic structure is found in the popliteal fossa. Left: No evidence of common femoral vein obstruction.  She notes that she hurt her back at work on 8/29- she is going through [facility] for this injury. She is actually getting an MRI for her back this evening She is not sure if these 2 issues are related.  Perhaps a ruptured disc in her back is causing this new pain in her right leg. She relates that over the last month her left buttock and leg have hurt.  However last week her right leg started to hurt too- behind the knee and into the anterior knee as well.  She was not awrae of any new injury.,   She also got a prednisone taper just recently from her ortho and finished this up last week.  A couple of days after she finished up the pred is when the right leg pain really started.  She went to the ER on Sunday- relates that on Saturday she was miserable with pain  She is taking mobic currently  lidoderm patch did not seem to help so she stopped using this   She has used a leftover percocet that she had from surgery a few days ago and this helped - she has about 2 left and would appreciate a few more to have on hand    N[facility]:  04/20/2018  2  04/20/2018  Oxycodone-Acetaminophen 5-325  10.00 10 Je Cop  [id number]  Med (5269)  0/0 7.50 MME Comm Ins  [place]  03/31/2018  1  03/31/2018  Tramadol Hcl 50 Mg Tablet  20.00 5 Ar Ram  [id number]  [place] (0906)  0/0 20.00 MME Comm Ins  [place]  09/13/2017  1  09/01/2017  Oxycodone-Acetaminophen 10-325  15.00 5 Ch Cra  [id number]  [place] (4491)  0/0       Patient Active Problem List   Diagnosis Date Noted  . Obesity (BMI 35.0-39.9 without comorbidity) 04/06/2018  . High triglycerides 04/06/2018  . Chest pain 07/09/2017  . Fibromyalgia 07/09/2017  . Hypertension 07/09/2017    Past Medical History:  Diagnosis Date  . Anemia    hx of  . Arthritis   . Cataract    as a child  . Chicken pox   . Depression    hx of in past situational  . Fibromyalgia   . GERD (gastroesophageal reflux disease)   . Headache    hx of migraines  . History of kidney stones    1996  . Hypertension     Past Surgical History:  Procedure Laterality Date  . ABDOMINAL HYSTERECTOMY    . CHOLECYSTECTOMY     03-31-18 Ramirez  . CHOLECYSTECTOMY N/A 03/31/2018   Procedure: LAPAROSCOPIC CHOLECYSTECTOMY;  Surgeon: Ramirez, Armando, MD;  Location: [facility];  Service: General;  Laterality: N/A;  . COLONOSCOPY    . endoplantar fasciotomy    . GALLBLADDER SURGERY  03/31/2018  . KNEE ARTHROSCOPY     left knee 2012  . LAPAROSCOPIC CHOLECYSTECTOMY    . TONSILLECTOMY     19 99  . UPPER GASTROINTESTINAL ENDOSCOPY      Social History   Tobacco Use  . Smoking status: Never Smoker  . Smokeless tobacco: Never Used  Substance Use Topics  . Alcohol use: Yes    Comment: occ  . Drug use: No    Family History  Problem Relation Age of Onset  . Heart disease Father   . Prostate cancer Father   . Diabetes Father   . Irritable bowel syndrome Father   . Kidney disease Father   . Mitral valve prolapse Mother   . Heart disease Mother   . Pancreatic cancer Maternal Aunt   . Liver cancer Paternal Uncle   . Colon  cancer Paternal Uncle   . Esophageal cancer Neg Hx   . Stomach cancer Neg Hx   . Rectal cancer Neg Hx     Allergies  Allergen Reactions  . Aspartame Other (See Comments)    Headaches.  . Demerol [Meperidine] Nausea And Vomiting    Medication list has been reviewed and updated.  Current Outpatient Medications on File Prior to Visit  Medication Sig Dispense Refill  . amLODipine (NORVASC) 10 MG tablet Take 1 tablet (10 mg total) by mouth daily. 90 tablet 3  . Ascorbic Acid (  VITAMIN C) 1000 MG tablet Take 1,000 mg by mouth daily.    Marland Kitchen atorvastatin (LIPITOR) 20 MG tablet Take 1 tablet (20 mg total) by mouth daily. 90 tablet 3  . B Complex Vitamins (B COMPLEX 1 PO) Take 1 tablet by mouth daily.     Marland Kitchen BIOTIN PO Take 1 capsule by mouth daily.    Marland Kitchen CALCIUM PO Take 1 tablet by mouth daily.    . cyclobenzaprine (FLEXERIL) 10 MG tablet Take 10 mg by mouth 3 (three) times daily as needed for muscle spasms.     . DULoxetine (CYMBALTA) 60 MG capsule Take 1 capsule (60 mg total) by mouth 2 (two) times daily. 180 capsule 3  . Flaxseed, Linseed, (FLAXSEED OIL PO) Take 1 capsule by mouth daily.    . hydrochlorothiazide (MICROZIDE) 12.5 MG capsule Take 1 capsule (12.5 mg total) by mouth daily. 90 capsule 3  . ibuprofen (ADVIL,MOTRIN) 800 MG tablet Take 800 mg by mouth every 8 (eight) hours as needed for moderate pain.    Marland Kitchen MEGARED OMEGA-3 KRILL OIL PO Take 1 capsule by mouth daily.    . meloxicam (MOBIC) 15 MG tablet Take 1 tablet (15 mg total) by mouth daily. 90 tablet 3  . methocarbamol (ROBAXIN) 500 MG tablet Take 500 mg by mouth 2 (two) times daily.  1  . nitrofurantoin, macrocrystal-monohydrate, (MACROBID) 100 MG capsule Take 1 capsule daily as needed following intercourse to prevent UTI 30 capsule 2  . omeprazole (PRILOSEC) 20 MG capsule Take 1 capsule (20 mg total) by mouth daily. 90 capsule 3   No current facility-administered medications on file prior to visit.     Review of Systems:  As  per HPI- otherwise negative. No fever or chills  no CP or SOB No rash, no right leg injury that she can recall    Physical Examination: Vitals:   07/31/18 1545  BP: 120/80  Pulse: 92  Resp: 16  Temp: 98.4 F (36.9 C)  SpO2: 97%   Vitals:   07/31/18 1545  Weight: 239 lb (108.4 kg)  Height: 5\' 6"  (1.676 m)   Body mass index is 38.58 kg/m. Ideal Body Weight: Weight in (lb) to have BMI = 25: 154.6  GEN: WDWN, NAD, Non-toxic, A & O x 3, obese, looks well  HEENT: Atraumatic, Normocephalic. Neck supple. No masses, No LAD. Ears and Nose: No external deformity. CV: RRR, No M/G/R. No JVD. No thrill. No extra heart sounds. PULM: CTA B, no wheezes, crackles, rhonchi. No retractions. No resp. distress. No accessory muscle use. EXTR: No c/c/e NEURO Normal gait.  PSYCH: Normally interactive. Conversant. Not depressed or anxious appearing.  Calm demeanor.  Right knee shows normal ROM and no effusion.,  She is tender in the popliteal fossa however.  No evidence of a joint infection. She indicates her midline lower back as the site of her original pain. She is walking slowly and performs a toe raise with difficulty Negative SLR bilaterally Normal sensation of her BLE     Assessment and Plan: Right leg pain - Plan: cyclobenzaprine (FLEXERIL) 10 MG tablet, oxyCODONE-acetaminophen (PERCOCET/ROXICET) 5-325 MG tablet  Low back pain radiating to left lower extremity - Plan: cyclobenzaprine (FLEXERIL) 10 MG tablet, oxyCODONE-acetaminophen (PERCOCET/ROXICET) 5-325 MG tablet  Back injury about a month go which is being managed through [facility]. She is getting an MRI tonight actually. Here today to follow-up from ER visit for a different issue, right leg/ knee pain.  We are not yet sure if this  is due to her back or if it might be the baker's cyst that was noted on imaging.   She has robaxin that she might use when she is working. But would like some flexeril to have when she does not have to work or  drive. She knows not to use multiple sedating medication at the same time  Also will give her a few more percocet to have on hand.  Await MRI  Meds ordered this encounter  Medications  . cyclobenzaprine (FLEXERIL) 10 MG tablet    Sig: Take 1 tablet (10 mg total) by mouth 2 (two) times daily as needed for muscle spasms.    Dispense:  30 tablet    Refill:  1  . oxyCODONE-acetaminophen (PERCOCET/ROXICET) 5-325 MG tablet    Sig: Take 1 tablet by mouth every 8 (eight) hours as needed for severe pain.    Dispense:  12 tablet    Refill:  0     Signed Lamar Blinks, MD

## 2018-07-31 NOTE — Patient Instructions (Addendum)
It was good to see you today -I am sorry that you are having such a hard time Hopefully your MRI tonight will provide some answers for [place] rx today for flexeril to use as needed- use this or robaxin, not both Also rx for a few percocet to use in case of pain emergency Both these meds will make you sleepy and should not be used together  Let's touch base after your MRI is read

## 2018-08-02 ENCOUNTER — Encounter: Payer: Self-pay | Admitting: Family Medicine

## 2018-08-09 ENCOUNTER — Encounter: Payer: Self-pay | Admitting: Physical Therapy

## 2018-08-09 ENCOUNTER — Other Ambulatory Visit: Payer: Self-pay

## 2018-08-09 ENCOUNTER — Ambulatory Visit: Payer: PRIVATE HEALTH INSURANCE | Attending: Orthopedic Surgery | Admitting: Physical Therapy

## 2018-08-09 DIAGNOSIS — M545 Low back pain, unspecified: Secondary | ICD-10-CM

## 2018-08-09 DIAGNOSIS — R262 Difficulty in walking, not elsewhere classified: Secondary | ICD-10-CM | POA: Insufficient documentation

## 2018-08-09 DIAGNOSIS — M9903 Segmental and somatic dysfunction of lumbar region: Secondary | ICD-10-CM | POA: Insufficient documentation

## 2018-08-09 DIAGNOSIS — M6281 Muscle weakness (generalized): Secondary | ICD-10-CM | POA: Diagnosis present

## 2018-08-09 NOTE — Therapy (Signed)
[place], [place], [address] Phone: [phone]   Fax:  [phone]  Physical Therapy Evaluation  Patient Details  Name: Dawn Reynolds MRN: [id number] Date of Birth: 1966-06-21 Referring Provider (PT): Dr Lynann Bologna    Encounter Date: 08/09/2018  PT End of Session - 08/09/18 1543    Visit Number  1    Number of Visits  8    Date for PT Re-Evaluation  09/15/18   allow 5 weeks for scheduling    Authorization - Visit Number  1    Authorization - Number of Visits  8    PT Start Time  1245    PT Stop Time  1530    PT Time Calculation (min)  59 min    Activity Tolerance  Patient tolerated treatment well    Behavior During Therapy  [facility] for tasks assessed/performed       Past Medical History:  Diagnosis Date  . Anemia    hx of  . Arthritis   . Cataract    as a child  . Chicken pox   . Depression    hx of in past situational  . Fibromyalgia   . GERD (gastroesophageal reflux disease)   . Headache    hx of migraines  . History of kidney stones    1996  . Hypertension     Past Surgical History:  Procedure Laterality Date  . ABDOMINAL HYSTERECTOMY    . CHOLECYSTECTOMY     03-31-18 [name]  . CHOLECYSTECTOMY N/A 03/31/2018   Procedure: LAPAROSCOPIC CHOLECYSTECTOMY;  Surgeon: Ralene Ok, MD;  Location: [facility];  Service: General;  Laterality: N/A;  . COLONOSCOPY    . endoplantar fasciotomy    . GALLBLADDER SURGERY  03/31/2018  . KNEE ARTHROSCOPY     left knee 2012  . LAPAROSCOPIC CHOLECYSTECTOMY    . TONSILLECTOMY     1999  . UPPER GASTROINTESTINAL ENDOSCOPY      There were no vitals filed for this visit.   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  07/06/18.  Pain is overall improving.  She has had resolution of sensory changes ,denies weakness.  Pain is in lower lumbar spine on L  side.  She has been released to return to work 8 hour days with back brace (LSO).      Pertinent History  fibromyalgia, Rt knee pain , arthritis, HTN    Limitations  Lifting;Standing;Walking;House hold activities;Other (comment)    How long can you stand comfortably?  can be on her feet 1-2 hours, gets tired     How long can you walk comfortably?  does not walk for exercise      Diagnostic tests  MRI 08/01/18: mild lumbar spondylosis     Patient Stated Goals  Patient would like to get rid of the pain and strength her core     Currently in Pain?  Yes    Pain Score  2     Pain Location  Back    Pain Orientation  Left;Lower    Pain Descriptors / Indicators  Tightness;Aching;Sore    Pain Type  Acute pain    Pain Radiating Towards  buttock     Pain Onset  More than a month ago    Pain Frequency  Intermittent    Aggravating Factors   activity  Pain Relieving Factors  rest, change position , meds help (pre-injury) Percocet intermittent     Effect of Pain on Daily Activities  limts her function at work , worried about her career, depressed          [facility] PT Assessment - 08/09/18 0001      Assessment   Medical Diagnosis  low back pain     Referring Provider (PT)  Dr Lynann Bologna     Onset Date/Surgical Date  07/06/18    Next MD Visit  Sees Dr. Mayer Camel tomorrow for knee     Prior Therapy  No      Precautions   Precautions  None    Required Braces or Orthoses  Spinal Brace    Spinal Brace  Lumbar corset;Other (comment)   recommended by MD for work      Restrictions   Weight Bearing Restrictions  No      Balance Screen   Has the patient fallen in the past 6 months  No      [place] residence    Living Arrangements  Spouse/significant other    Available Help at Discharge  Friend(s)    Type of [place]      Prior Function   Level of Independence  Independent    Vocation  Full time employment    Occupational psychologist critical care      Leisure  [place],       Cognition   Overall Cognitive Status  Within Functional Limits for tasks assessed      Observation/Other Assessments   Focus on Therapeutic Outcomes (FOTO)   46%      Sensation   Light Touch  Appears Intact      Posture/Postural Control   Posture/Postural Control  Postural limitations    Postural Limitations  Rounded Shoulders;Forward head;Decreased lumbar lordosis    Posture Comments  wide BOS, genu varus       AROM   Lumbar Flexion  WFL, pain L L5     Lumbar Extension  25% discomfort L side     Lumbar - Right Side Bend  WFL    Lumbar - Left Side Bend  WFL    Lumbar - Right Rotation  WFL    Lumbar - Left Rotation  WFL end range pain on L       PROM   Overall PROM Comments  L hip flexion to 100 deg pain in back , good ROM in hips bilaterally ER/IR       Strength   Right Hip Flexion  4/5    Right Hip ABduction  4+/5    Left Hip Flexion  4/5    Left Hip ABduction  3/5    Right Knee Flexion  5/5    Right Knee Extension  5/5    Left Knee Flexion  5/5    Left Knee Extension  5/5    Right/Left Ankle  --   [facility]      Palpation   Spinal mobility  normal in L spine discomfort     SI assessment   Rt ASIS higher than L viewed in supine , mild  (L anterior )    Palpation comment  Pain along L SI border, spasm , sore into piriformis, gluteals and into lateral hip at [address] , sore in lumbar paraspinals       Special Tests    Special Tests  Lumbar;Sacrolliac Tests;Leg LengthTest  Lumbar Tests  Prone Knee Bend Test;Straight Leg Raise    Sacroiliac Tests   Sacral Compression    Leg length test   other      Prone Knee Bend Test   Findings  Negative    Side  Left      Straight Leg Raise   Findings  Negative    Side   Left    Comment  ERP       Sacral Compression   Findings  Positive    Comments  relief       Ambulation/Gait   Gait Pattern  Decreased stance time - left;Antalgic;Lateral hip instability                Objective  measurements completed on examination: See above findings.              PT Education - 08/09/18 1542    Education Details  PT/POC, HEP, anatomy, SIJ , stabilization    Person(s) Educated  Patient    Methods  Explanation;Handout;Demonstration    Comprehension  Verbalized understanding;Returned demonstration;Need further instruction          PT Long Term Goals - 08/09/18 1544      PT LONG TERM GOAL #1   Title  Pt will be I with HEP for core stability, posture, flexibility    Time  5    Period  Weeks    Status  New    Target Date  09/22/18      PT LONG TERM GOAL #2   Title  Pt will be able to demo 4+/5 or more on LLE in hip abduction, extension without increased pain     Time  5    Period  Weeks    Status  New    Target Date  09/22/18      PT LONG TERM GOAL #3   Title  Pt will be able to lift and squat with no more than min pain intermittently to allow return to full duty at work     Time  5    Period  Weeks    Status  New    Target Date  09/22/18      PT LONG TERM GOAL #4   Title  Pt will be able to demo good body mechanics with lifting, bed mobility and ADLs to prevent re-injury     Time  5    Period  Weeks    Status  New    Target Date  09/22/18             Plan - 08/09/18 1548    Clinical Impression Statement  Patient     Clinical Presentation  Stable    Clinical Decision Making  Low    Rehab Potential  Excellent    PT Frequency  Other (comment)   8 visits    PT Duration  6 weeks    PT Treatment/Interventions  ADLs/Self Care Home Management;Electrical Stimulation;Functional mobility training;Neuromuscular re-education;Taping;Therapeutic activities;Iontophoresis 37m/ml Dexamethasone;Cryotherapy;Ultrasound;Traction;Moist Heat;Gait training;Stair training;Balance training;Therapeutic exercise;Patient/family education;Manual techniques;Passive range of motion;Dry needling    PT Next Visit Plan  check HEP, Stabilization , ICE , manual     PT Home  Exercise Plan  LTR, MET for L ant innominate , Iso Tr A     Consulted and Agree with Plan of Care  Patient       Patient will benefit from skilled therapeutic intervention in order to improve the following deficits and impairments:  Abnormal  gait, Decreased balance, Decreased endurance, Decreased mobility, Difficulty walking, Hypomobility, Obesity, Improper body mechanics, Decreased range of motion, Decreased activity tolerance, Decreased strength, Hypermobility, Increased fascial restricitons, Impaired flexibility, Postural dysfunction, Pain  Visit Diagnosis: Lumbosacral dysfunction  Acute left-sided low back pain without sciatica  Muscle weakness (generalized)  Difficulty walking     Problem List Patient Active Problem List   Diagnosis Date Noted  . Obesity (BMI 35.0-39.9 without comorbidity) 04/06/2018  . High triglycerides 04/06/2018  . Chest pain 07/09/2017  . Fibromyalgia 07/09/2017  . Hypertension 07/09/2017    Dawn Reynolds 08/09/2018, 3:55 PM  [facility] [address] [place], [place], [address] Phone: [phone]   Fax:  [phone]  Name: Dawn Reynolds MRN: [id number] Date of Birth: May 12, 1966

## 2018-08-10 DIAGNOSIS — M1711 Unilateral primary osteoarthritis, right knee: Secondary | ICD-10-CM | POA: Diagnosis not present

## 2018-08-10 DIAGNOSIS — M1712 Unilateral primary osteoarthritis, left knee: Secondary | ICD-10-CM | POA: Diagnosis not present

## 2018-08-15 ENCOUNTER — Encounter

## 2018-08-22 ENCOUNTER — Encounter: Payer: Self-pay | Admitting: Physical Therapy

## 2018-08-22 ENCOUNTER — Ambulatory Visit: Payer: 59 | Attending: Orthopedic Surgery | Admitting: Physical Therapy

## 2018-08-22 DIAGNOSIS — M6281 Muscle weakness (generalized): Secondary | ICD-10-CM | POA: Insufficient documentation

## 2018-08-22 DIAGNOSIS — M545 Low back pain, unspecified: Secondary | ICD-10-CM

## 2018-08-22 DIAGNOSIS — R262 Difficulty in walking, not elsewhere classified: Secondary | ICD-10-CM | POA: Diagnosis not present

## 2018-08-22 DIAGNOSIS — M9903 Segmental and somatic dysfunction of lumbar region: Secondary | ICD-10-CM | POA: Diagnosis not present

## 2018-08-22 NOTE — Therapy (Signed)
[place], [place], [address] Phone: [phone]   Fax:  [phone]  Physical Therapy Treatment  Patient Details  Name: Dawn Reynolds MRN: [id number] Date of Birth: [birth year] Referring Provider (PT): Dr Lynann Bologna    Encounter Date: 08/22/2018  PT End of Session - 08/22/18 2049    Visit Number  2    Number of Visits  8    Date for PT Re-Evaluation  09/15/18    Authorization - Visit Number  2    Authorization - Number of Visits  8    PT Start Time  1632    PT Stop Time  1728    PT Time Calculation (min)  56 min    Activity Tolerance  Patient limited by pain    Behavior During Therapy  Anxious       Past Medical History:  Diagnosis Date  . Anemia    hx of  . Arthritis   . Cataract    as a child  . Chicken pox   . Depression    hx of in past situational  . Fibromyalgia   . GERD (gastroesophageal reflux disease)   . Headache    hx of migraines  . History of kidney stones    1996  . Hypertension     Past Surgical History:  Procedure Laterality Date  . ABDOMINAL HYSTERECTOMY    . CHOLECYSTECTOMY     03-31-18 [name]  . CHOLECYSTECTOMY N/A 03/31/2018   Procedure: LAPAROSCOPIC CHOLECYSTECTOMY;  Surgeon: Ralene Ok, MD;  Location: [facility];  Service: General;  Laterality: N/A;  . COLONOSCOPY    . endoplantar fasciotomy    . GALLBLADDER SURGERY  03/31/2018  . KNEE ARTHROSCOPY     left knee 2012  . LAPAROSCOPIC CHOLECYSTECTOMY    . TONSILLECTOMY     1999  . UPPER GASTROINTESTINAL ENDOSCOPY      There were no vitals filed for this visit.  Subjective Assessment - 08/22/18 2037    Subjective  Pt. returns, not seen since 08-09-18 with travel out of town last week. She reports lost her HEP sheet from eval/limited HEP performance since last visit.     Currently in Pain?  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 PT Assessment - 08/22/18 0001      Palpation   SI assessment   --   no innominate rotation noted with longsitting test                  [facility] Adult PT Treatment/Exercise - 08/22/18 0001      Exercises   Exercises  Lumbar;Knee/Hip      Lumbar Exercises: Stretches   Passive Hamstring Stretch  Left;Right;3 reps;30 seconds    Lower Trunk Rotation  --   15 x 5-10 sec toward right (pain to left so held)   Piriformis  Stretch  Left;3 reps;30 seconds      Lumbar Exercises: Supine   Pelvic Tilt  20 reps   TA isometric combo with hooklying PPT   Clam  20 reps    Clam Limitations  red Theraband    Bent Knee Raise  15 reps   bilat. with PPT   Bridge  15 reps   partial bridge   Other Supine Lumbar Exercises  hip add. isometric with ball squeeze x 20      Modalities   Modalities  Moist Heat;Electrical Stimulation      Moist Heat Therapy   Number Minutes Moist Heat  10 Minutes    Moist Heat Location  Lumbar Spine      Electrical Stimulation   Electrical Stimulation Location  left lumbosacral region    Electrical Stimulation Action  IFC    Electrical Stimulation Parameters  to tolerance    Electrical Stimulation Goals  Pain      Manual Therapy   Manual Therapy  Muscle Energy Technique;Joint mobilization;Soft tissue mobilization    Joint Mobilization  Left hip long axis distraction grade I-III in supine    Soft tissue mobilization  Left piriformis and lumbosacral region/paraspinals   including roller use for piriformis   Muscle Energy Technique  "shotgun" hip add. and abd. isometrics from hooklying 5x5 sec ea.   no  innominate rotation noted            PT Education - 08/22/18 2048    Education Details  HEP (issues new copy of handout), SI anatomy with review model    Person(s) Educated  Patient    Methods  Explanation;Demonstration;Handout    Comprehension  Verbalized understanding;Returned demonstration;Verbal cues required          PT Long Term Goals - 08/09/18 1544      PT LONG TERM GOAL #1   Title  Pt will be I with HEP for core stability, posture, flexibility    Time  5    Period  Weeks    Status  New    Target Date  09/22/18      PT LONG TERM GOAL #2   Title  Pt will be able to demo 4+/5 or more on LLE in hip abduction, extension without increased pain     Time  5    Period  Weeks    Status  New    Target Date  09/22/18      PT LONG TERM GOAL #3   Title  Pt will be able to lift and squat with no more than min pain intermittently to allow return to full duty at work     Time  5    Period  Weeks    Status  New    Target Date  09/22/18      PT LONG TERM GOAL #4   Title  Pt will be able to demo good body mechanics with lifting, bed mobility and ADLs to prevent re-injury     Time  5    Period  Weeks    Status  New    Target Date  09/22/18            Plan - 08/22/18 2050    Clinical Impression Statement  Rechecked SI/innominate with no rotation noted. Continued core/lumbopelvis stabilization with fair/limited tx. tolerance at time for exercises due to pain. For manual therapy good initial response to long axis hip distraction as "unloading" technique but developed soreness with this as  well. Needs cueing for core activation.    PT Frequency  --   8 visits   PT Duration  6 weeks    PT Treatment/Interventions  ADLs/Self Care Home Management;Electrical Stimulation;Functional mobility training;Neuromuscular re-education;Taping;Therapeutic activities;Iontophoresis 43m/ml Dexamethasone;Cryotherapy;Ultrasound;Traction;Moist Heat;Gait training;Stair training;Balance  training;Therapeutic exercise;Patient/family education;Manual techniques;Passive range of motion;Dry needling    PT Next Visit Plan  review HEP as needed, check for innominate rotation with METs prn to address, continue lumbopelvic and core stabilization as tolerared, STM/manual techniques and modalities as needed    PT Home Exercise Plan  LTR, MET for L ant innominate , Iso Tr A     Consulted and Agree with Plan of Care  Patient       Patient will benefit from skilled therapeutic intervention in order to improve the following deficits and impairments:  Abnormal gait, Decreased balance, Decreased endurance, Decreased mobility, Difficulty walking, Hypomobility, Obesity, Improper body mechanics, Decreased range of motion, Decreased activity tolerance, Decreased strength, Hypermobility, Increased fascial restricitons, Impaired flexibility, Postural dysfunction, Pain  Visit Diagnosis: Lumbosacral dysfunction  Acute left-sided low back pain without sciatica  Muscle weakness (generalized)  Difficulty walking     Problem List Patient Active Problem List   Diagnosis Date Noted  . Obesity (BMI 35.0-39.9 without comorbidity) 04/06/2018  . High triglycerides 04/06/2018  . Chest pain 07/09/2017  . Fibromyalgia 07/09/2017  . Hypertension 07/09/2017    CBeaulah Dinning PT, DPT 08/22/18 9:01 PM  [facility] [address]Phone: [phone]  Fax:  [phone] Name: TAldena WormMRN: [id number] of Birth: [birth year]

## 2018-08-24 ENCOUNTER — Ambulatory Visit: Payer: 59 | Admitting: Physical Therapy

## 2018-08-24 ENCOUNTER — Encounter: Payer: Self-pay | Admitting: Physical Therapy

## 2018-08-24 DIAGNOSIS — M545 Low back pain, unspecified: Secondary | ICD-10-CM

## 2018-08-24 DIAGNOSIS — M9903 Segmental and somatic dysfunction of lumbar region: Secondary | ICD-10-CM

## 2018-08-24 DIAGNOSIS — M6281 Muscle weakness (generalized): Secondary | ICD-10-CM

## 2018-08-24 DIAGNOSIS — R262 Difficulty in walking, not elsewhere classified: Secondary | ICD-10-CM

## 2018-08-24 NOTE — Patient Instructions (Signed)

## 2018-08-24 NOTE — Therapy (Signed)
Dawn Reynolds, [place], [address] Phone: [phone]   Fax:  [phone]  Physical Therapy Treatment  Patient Details  Name: Dawn Reynolds MRN: [id number] Date of Birth: Mar 09, 1966 Referring Provider (PT): Dawn Reynolds    Encounter Date: 08/24/2018  PT End of Session - 08/24/18 1507    Visit Number  3    Date for PT Re-Evaluation  09/15/18    Authorization - Number of Visits  8    PT Start Time  1507    PT Stop Time  1549    PT Time Calculation (min)  42 min    Activity Tolerance  Patient tolerated treatment well       Past Medical History:  Diagnosis Date  . Anemia    hx of  . Arthritis   . Cataract    as a child  . Chicken pox   . Depression    hx of in past situational  . Fibromyalgia   . GERD (gastroesophageal reflux disease)   . Headache    hx of migraines  . History of kidney stones    1996  . Hypertension     Past Surgical History:  Procedure Laterality Date  . ABDOMINAL HYSTERECTOMY    . CHOLECYSTECTOMY     03-31-18 Dawn Reynolds  . CHOLECYSTECTOMY N/A 03/31/2018   Procedure: LAPAROSCOPIC CHOLECYSTECTOMY;  Surgeon: Dawn Ok, MD;  Location: [facility];  Service: General;  Laterality: N/A;  . COLONOSCOPY    . endoplantar fasciotomy    . GALLBLADDER SURGERY  03/31/2018  . KNEE ARTHROSCOPY     left knee 2012  . LAPAROSCOPIC CHOLECYSTECTOMY    . TONSILLECTOMY     1999  . UPPER GASTROINTESTINAL ENDOSCOPY      There were no vitals filed for this visit.  Subjective Assessment - 08/24/18 1508    Subjective  Pt reports that Dawn Reynolds felt pretty good after her last visit.  The stim and heat helped alot.     Patient Stated Goals  Patient would like to get rid of the pain and strength her core     Currently in Pain?  Yes    Pain Score  3     Pain Location  Back    Pain Orientation  Left    Pain Descriptors / Indicators  Dull                       [facility] Adult PT  Treatment/Exercise - 08/24/18 0001      Posture/Postural Control   Posture Comments  pt in good alingment of pelvis      Exercises   Exercises  Lumbar      Lumbar Exercises: Supine   Ab Set  20 reps   with single leg knee in/out   Bridge  15 reps   in painfree ROM      Modalities   Modalities  Moist Heat;Electrical Stimulation      Moist Heat Therapy   Number Minutes Moist Heat  15 Minutes    Moist Heat Location  --   buttocks     Electrical Stimulation   Electrical Stimulation Location  Lt buttock    Electrical Stimulation Action  IFC    Electrical Stimulation Parameters  to tolerance    Electrical Stimulation Goals  Tone;Pain      Manual Therapy   Manual Therapy  Passive ROM;Manual Traction    Soft tissue mobilization  STM to Lt gluts and  piriformis with passve release    Passive ROM  stretching Lt hip into rotatiion    Manual Traction  Lt hip distraction grade II-III       Trigger Point Dry Needling - 08/24/18 1550    Consent Given?  Yes    Education Handout Provided  Yes    Muscles Treated Lower Body  Gluteus maximus;Gluteus minimus;Piriformis   Lt   Gluteus Maximus Response  Palpable increased muscle length;Twitch response elicited    Gluteus Minimus Response  Palpable increased muscle length;Twitch response elicited    Piriformis Response  Palpable increased muscle length;Twitch response elicited                PT Long Term Goals - 08/09/18 1544      PT LONG TERM GOAL #1   Title  Pt will be I with HEP for core stability, posture, flexibility    Time  5    Period  Weeks    Status  New    Target Date  09/22/18      PT LONG TERM GOAL #2   Title  Pt will be able to demo 4+/5 or more on LLE in hip abduction, extension without increased pain     Time  5    Period  Weeks    Status  New    Target Date  09/22/18      PT LONG TERM GOAL #3   Title  Pt will be able to lift and squat with no more than min pain intermittently to allow return to full  duty at work     Time  5    Period  Weeks    Status  New    Target Date  09/22/18      PT LONG TERM GOAL #4   Title  Pt will be able to demo good body mechanics with lifting, bed mobility and ADLs to prevent re-injury     Time  5    Period  Weeks    Status  New    Target Date  09/22/18            Plan - 08/24/18 1551    Clinical Impression Statement  Dawn Reynolds is still in good alignment, had a lot of tightness and tenderness in Lt gluts and piriformis, responded well to DN and manual work to this area.  Dawn Reynolds had increased hip motion and less pain at the end of the session, Dawn Reynolds may need more.  Dawn Reynolds is very weak in her core and would benefit from core stablization    Rehab Potential  Excellent    PT Duration  6 weeks    PT Treatment/Interventions  ADLs/Self Care Home Management;Electrical Stimulation;Functional mobility training;Neuromuscular re-education;Taping;Therapeutic activities;Iontophoresis 4mg /ml Dexamethasone;Cryotherapy;Ultrasound;Traction;Moist Heat;Gait training;Stair training;Balance training;Therapeutic exercise;Patient/family education;Manual techniques;Passive range of motion;Dry needling    PT Next Visit Plan  assess response to DN, continue if needed, core stabilization    Consulted and Agree with Plan of Care  Patient       Patient will benefit from skilled therapeutic intervention in order to improve the following deficits and impairments:  Abnormal gait, Decreased balance, Decreased endurance, Decreased mobility, Difficulty walking, Hypomobility, Obesity, Improper body mechanics, Decreased range of motion, Decreased activity tolerance, Decreased strength, Hypermobility, Increased fascial restricitons, Impaired flexibility, Postural dysfunction, Pain  Visit Diagnosis: Lumbosacral dysfunction  Acute left-sided low back pain without sciatica  Muscle weakness (generalized)  Difficulty walking     Problem List Patient Active Problem List  Diagnosis Date Noted   . Obesity (BMI 35.0-39.9 without comorbidity) 04/06/2018  . High triglycerides 04/06/2018  . Chest pain 07/09/2017  . Fibromyalgia 07/09/2017  . Hypertension 07/09/2017    Dawn Reynolds PT  08/24/2018, 3:55 PM  Dawn Reynolds [address] [place], [place], [address] Phone: [phone]   Fax:  [phone]  Name: Dawn Reynolds MRN: [id number] Date of Birth: 1966-01-22

## 2018-08-29 ENCOUNTER — Encounter: Payer: Self-pay | Admitting: Physical Therapy

## 2018-08-29 ENCOUNTER — Ambulatory Visit: Payer: 59 | Admitting: Physical Therapy

## 2018-08-29 DIAGNOSIS — R262 Difficulty in walking, not elsewhere classified: Secondary | ICD-10-CM

## 2018-08-29 DIAGNOSIS — M545 Low back pain, unspecified: Secondary | ICD-10-CM

## 2018-08-29 DIAGNOSIS — M6281 Muscle weakness (generalized): Secondary | ICD-10-CM | POA: Diagnosis not present

## 2018-08-29 DIAGNOSIS — M9903 Segmental and somatic dysfunction of lumbar region: Secondary | ICD-10-CM | POA: Diagnosis not present

## 2018-08-29 NOTE — Therapy (Signed)
[place], [place], [address] Phone: [phone]   Fax:  [phone]  Physical Therapy Treatment  Patient Details  Name: Dawn Reynolds MRN: [id number] Date of Birth: 1966-01-20 Referring Provider (PT): Dr Lynann Bologna    Encounter Date: 08/29/2018  PT End of Session - 08/29/18 1438    Visit Number  4    Number of Visits  8    Date for PT Re-Evaluation  09/15/18    Authorization - Visit Number  2    Authorization - Number of Visits  8    PT Start Time  0258    PT Stop Time  1430    PT Time Calculation (min)  55 min    Activity Tolerance  Patient tolerated treatment well    Behavior During Therapy  [facility] for tasks assessed/performed       Past Medical History:  Diagnosis Date  . Anemia    hx of  . Arthritis   . Cataract    as a child  . Chicken pox   . Depression    hx of in past situational  . Fibromyalgia   . GERD (gastroesophageal reflux disease)   . Headache    hx of migraines  . History of kidney stones    1996  . Hypertension     Past Surgical History:  Procedure Laterality Date  . ABDOMINAL HYSTERECTOMY    . CHOLECYSTECTOMY     03-31-18 [name]  . CHOLECYSTECTOMY N/A 03/31/2018   Procedure: LAPAROSCOPIC CHOLECYSTECTOMY;  Surgeon: Ralene Ok, MD;  Location: [facility];  Service: General;  Laterality: N/A;  . COLONOSCOPY    . endoplantar fasciotomy    . GALLBLADDER SURGERY  03/31/2018  . KNEE ARTHROSCOPY     left knee 2012  . LAPAROSCOPIC CHOLECYSTECTOMY    . TONSILLECTOMY     1999  . UPPER GASTROINTESTINAL ENDOSCOPY      There were no vitals filed for this visit.  Subjective Assessment - 08/29/18 1436    Subjective  Pt. reports good response from last tx. from dry needling-was initially sore but then felt improvement. Minimal pain pre-tx. but reports off work so sedentary today.                       [place] Adult PT Treatment/Exercise - 08/29/18 0001      Lumbar Exercises: Stretches   Piriformis Stretch  Left;3 reps;30 seconds   also performed left glut stretch 3x30 sec     Lumbar Exercises: Supine   Pelvic Tilt  20 reps    Clam  20 reps    Clam Limitations  red Theraband   Red Theraband   Bent Knee Raise  15 reps    Bridge  10 reps   stopped due to hamstring cramp   Other Supine Lumbar Exercises  --   hip add. isometric with small ball 3-5 sec x 20     Modalities   Modalities  Electrical Stimulation;Moist Heat      Moist Heat Therapy   Moist Heat Location  Lumbar Spine   left SI region     Electrical Stimulation   Electrical Stimulation Location  --   Left SI region   Electrical Stimulation Action  IFC    Electrical Stimulation Parameters  --   to tolerance   Electrical Stimulation Goals  Pain      Manual Therapy   Manual Therapy  Joint mobilization;Soft tissue mobilization;Muscle Energy Technique  Manual therapy comments  METS: shotgun and left ant hip innominate correction x 5 ea.    Joint Mobilization  Left hip long axis distraction grade I-IIII    Soft tissue mobilization  STM left glut, pirifo       Trigger Point Dry Needling - 08/29/18 1432    Consent Given?  Yes    Muscles Treated Lower Body  Gluteus minimus;Gluteus maximus;Piriformis   incl. STM/skilled palpation during needling   Gluteus Maximus Response  Twitch response elicited;Palpable increased muscle length    Gluteus Minimus Response  Palpable increased muscle length    Piriformis Response  Twitch response elicited;Palpable increased muscle length           PT Education - 08/29/18 1438    Education Details  HEP, POC, dry needling    Person(s) Educated  Patient    Methods  Explanation    Comprehension  Verbalized understanding          PT Long Term Goals - 08/09/18 1544      PT LONG TERM GOAL #1   Title  Pt will be I with HEP for core stability, posture, flexibility    Time  5    Period  Weeks    Status  New    Target Date   09/22/18      PT LONG TERM GOAL #2   Title  Pt will be able to demo 4+/5 or more on LLE in hip abduction, extension without increased pain     Time  5    Period  Weeks    Status  New    Target Date  09/22/18      PT LONG TERM GOAL #3   Title  Pt will be able to lift and squat with no more than min pain intermittently to allow return to full duty at work     Time  5    Period  Weeks    Status  New    Target Date  09/22/18      PT LONG TERM GOAL #4   Title  Pt will be able to demo good body mechanics with lifting, bed mobility and ADLs to prevent re-injury     Time  5    Period  Weeks    Status  New    Target Date  09/22/18            Plan - 08/29/18 1440    Clinical Impression Statement  Mild left anterior innominate rotation noted with longsitting test so brief METs to address. Continued with addition dry needling due to benefit reported from this after last session. Improved exercise tolerance from tx. last week.    PT Frequency  --   8 visits   PT Treatment/Interventions  ADLs/Self Care Home Management;Electrical Stimulation;Functional mobility training;Neuromuscular re-education;Taping;Therapeutic activities;Iontophoresis 15m/ml Dexamethasone;Cryotherapy;Ultrasound;Traction;Moist Heat;Gait training;Stair training;Balance training;Therapeutic exercise;Patient/family education;Manual techniques;Passive range of motion;Dry needling    PT Next Visit Plan  assess response to DN and continue if needed, core stabilization, check for innominate rotation and continue METs as needed, continue stabilization progression, left piriformis stretches, manual tx. as needed    PT Home Exercise Plan  LTR, MET for L ant innominate , Iso Tr A     Consulted and Agree with Plan of Care  Patient       Patient will benefit from skilled therapeutic intervention in order to improve the following deficits and impairments:  Abnormal gait, Decreased balance, Decreased endurance, Decreased mobility,  Difficulty walking,  Hypomobility, Obesity, Improper body mechanics, Decreased range of motion, Decreased activity tolerance, Decreased strength, Hypermobility, Increased fascial restricitons, Impaired flexibility, Postural dysfunction, Pain  Visit Diagnosis: Lumbosacral dysfunction  Acute left-sided low back pain without sciatica  Muscle weakness (generalized)  Difficulty walking     Problem List Patient Active Problem List   Diagnosis Date Noted  . Obesity (BMI 35.0-39.9 without comorbidity) 04/06/2018  . High triglycerides 04/06/2018  . Chest pain 07/09/2017  . Fibromyalgia 07/09/2017  . Hypertension 07/09/2017    Beaulah Dinning, PT, DPT 08/29/18 2:44 PM  [facility] [address] [place], [place], [address] Phone: [phone]   Fax:  [phone]  Name: Dawn Reynolds MRN: [id number] Date of Birth: 1966/07/22

## 2018-08-30 ENCOUNTER — Encounter: Payer: Self-pay | Admitting: Family Medicine

## 2018-08-31 ENCOUNTER — Ambulatory Visit: Payer: 59 | Admitting: Physical Therapy

## 2018-08-31 ENCOUNTER — Encounter: Payer: Self-pay | Admitting: Physical Therapy

## 2018-08-31 DIAGNOSIS — M9903 Segmental and somatic dysfunction of lumbar region: Secondary | ICD-10-CM

## 2018-08-31 DIAGNOSIS — R262 Difficulty in walking, not elsewhere classified: Secondary | ICD-10-CM | POA: Diagnosis not present

## 2018-08-31 DIAGNOSIS — M545 Low back pain, unspecified: Secondary | ICD-10-CM

## 2018-08-31 DIAGNOSIS — M6281 Muscle weakness (generalized): Secondary | ICD-10-CM | POA: Diagnosis not present

## 2018-08-31 NOTE — Therapy (Addendum)
[place], [place], [address] Phone: [phone]   Fax:  [phone]  Physical Therapy Treatment/Discharge  Patient Details  Name: Dawn Reynolds MRN: [id number] Date of Birth: 1966-10-04 Referring Provider (PT): Dr Lynann Bologna    Encounter Date: 08/31/2018  PT End of Session - 08/31/18 1624    Visit Number  5    Number of Visits  8    Date for PT Re-Evaluation  09/15/18    Authorization - Visit Number  5    Authorization - Number of Visits  8    PT Start Time  0272    PT Stop Time  5366    PT Time Calculation (min)  44 min    Activity Tolerance  Patient tolerated treatment well    Behavior During Therapy  [facility] for tasks assessed/performed       Past Medical History:  Diagnosis Date  . Anemia    hx of  . Arthritis   . Cataract    as a child  . Chicken pox   . Depression    hx of in past situational  . Fibromyalgia   . GERD (gastroesophageal reflux disease)   . Headache    hx of migraines  . History of kidney stones    1996  . Hypertension     Past Surgical History:  Procedure Laterality Date  . ABDOMINAL HYSTERECTOMY    . CHOLECYSTECTOMY     03-31-18 [name]  . CHOLECYSTECTOMY N/A 03/31/2018   Procedure: LAPAROSCOPIC CHOLECYSTECTOMY;  Surgeon: Ralene Ok, MD;  Location: [facility];  Service: General;  Laterality: N/A;  . COLONOSCOPY    . endoplantar fasciotomy    . GALLBLADDER SURGERY  03/31/2018  . KNEE ARTHROSCOPY     left knee 2012  . LAPAROSCOPIC CHOLECYSTECTOMY    . TONSILLECTOMY     1999  . UPPER GASTROINTESTINAL ENDOSCOPY      There were no vitals filed for this visit.  Subjective Assessment - 08/31/18 1550    Subjective  Soreness with work duties since last visit otherwise no new complaints/concerns this PM.         [facility] PT Assessment - 08/31/18 0001      Strength   Left Hip Flexion  4+/5    Left Hip ABduction  4/5   left hip also 4/5                   [facility] Adult PT Treatment/Exercise - 08/31/18 0001      Posture/Postural Control   Posture Comments  --   no innominate rotation noted     Lumbar Exercises: Stretches   Piriformis Stretch  Left;3 reps;30 seconds   piriformis and glut manual stretches 3x30 sec     Lumbar Exercises: Supine   Pelvic Tilt  20 reps    Clam  20 reps    Clam Limitations  green Theraband    Bent Knee Raise  20 reps    Bridge  20 reps      Modalities   Modalities  Electrical Stimulation;Moist Heat      Moist Heat Therapy   Number Minutes Moist Heat  15 Minutes    Moist Heat Location  Lumbar Spine   left SI region     Electrical Stimulation   Electrical Stimulation Location  --   Left SI region   Electrical Stimulation Action  IFC    Electrical Stimulation Parameters  --   to tolerance x 15  min with MHP   Electrical Stimulation Goals  Pain      Manual Therapy   Manual Therapy  Joint mobilization;Soft tissue mobilization    Joint Mobilization  Left long axis hip distraction grade I-III    Soft tissue mobilization  left gluts, piriformis       Trigger Point Dry Needling - 08/31/18 1622    Consent Given?  Yes    Muscles Treated Lower Body  Gluteus minimus;Gluteus maximus;Piriformis    Gluteus Maximus Response  Palpable increased muscle length    Gluteus Minimus Response  Palpable increased muscle length    Piriformis Response  Palpable increased muscle length           PT Education - 08/31/18 1623    Education Details  POC    Person(s) Educated  Patient    Methods  Explanation    Comprehension  Verbalized understanding          PT Long Term Goals - 08/31/18 1630      PT LONG TERM GOAL #1   Title  Pt will be I with HEP for core stability, posture, flexibility    Baseline  met for initial HEP, will update prn    Time  5    Period  Weeks    Status  On-going    Target Date  09/22/18      PT LONG TERM GOAL #2   Title  Pt will be able to demo 4+/5 or  more on LLE in hip abduction, extension without increased pain     Baseline  4/5, see objective: 4 to 4+/5    Time  5    Period  Weeks    Status  Partially Met    Target Date  09/22/18      PT LONG TERM GOAL #3   Title  Pt will be able to lift and squat with no more than min pain intermittently to allow return to full duty at work     Time  5    Period  Weeks    Status  On-going      PT LONG TERM GOAL #4   Title  Pt will be able to demo good body mechanics with lifting, bed mobility and ADLs to prevent re-injury     Time  5    Period  Weeks    Status  On-going    Target Date  09/22/18            Plan - 08/31/18 1628    Clinical Impression Statement  No innominate rotation noted today. SI pain mild today, still with a fair amount of soft tissue restriction left piriformis and glut addressed with STM and dry needling. Still with left hip weakess but gradually making progress re: therapy goals.    Rehab Potential  Excellent    PT Frequency  --   8 visits   PT Duration  6 weeks   5 weeks   PT Treatment/Interventions  ADLs/Self Care Home Management;Electrical Stimulation;Functional mobility training;Neuromuscular re-education;Taping;Therapeutic activities;Iontophoresis 26m/ml Dexamethasone;Cryotherapy;Ultrasound;Traction;Moist Heat;Gait training;Stair training;Balance training;Therapeutic exercise;Patient/family education;Manual techniques;Passive range of motion;Dry needling    PT Next Visit Plan  assess response to DN and continue if needed, core stabilization, check for innominate rotation and continue METs as needed, continue stabilization progression, left piriformis stretches, manual tx. as needed    PT Home Exercise Plan  LTR, MET for L ant innominate , Iso Tr A     Consulted and Agree with Plan of  Care  Patient       Patient will benefit from skilled therapeutic intervention in order to improve the following deficits and impairments:  Abnormal gait, Decreased balance,  Decreased endurance, Decreased mobility, Difficulty walking, Hypomobility, Obesity, Improper body mechanics, Decreased range of motion, Decreased activity tolerance, Decreased strength, Hypermobility, Increased fascial restricitons, Impaired flexibility, Postural dysfunction, Pain  Visit Diagnosis: Lumbosacral dysfunction  Acute left-sided low back pain without sciatica  Muscle weakness (generalized)  Difficulty walking     Problem List Patient Active Problem List   Diagnosis Date Noted  . Obesity (BMI 35.0-39.9 without comorbidity) 04/06/2018  . High triglycerides 04/06/2018  . Chest pain 07/09/2017  . Fibromyalgia 07/09/2017  . Hypertension 07/09/2017  Beaulah Dinning, PT, DPT 08/31/18 4:36 PM    [facility] [address] [place], [place], [address] Phone: [phone]   Fax:  [phone]  Name: Dawn Reynolds MRN: [id number] Date of Birth: [birth year]   PHYSICAL THERAPY DISCHARGE SUMMARY  Visits from Start of Care: 5  Current functional level related to goals / functional outcomes: Unknown related to LBP   Remaining deficits: Pain, core and hip weakness   Education / Equipment: HEP, dry needling  Plan: Patient agrees to discharge.  Patient goals were not met. Patient is being discharged due to a change in medical status.  ?????   Patient having TKA bilateral this week.   Raeford Razor, PT 09/28/18 10:53 AM Phone: [phone] Fax: [phone]

## 2018-09-05 ENCOUNTER — Encounter: Payer: Self-pay | Admitting: Physical Therapy

## 2018-09-07 ENCOUNTER — Other Ambulatory Visit: Payer: Self-pay | Admitting: Orthopedic Surgery

## 2018-09-07 ENCOUNTER — Encounter: Payer: Self-pay | Admitting: Physical Therapy

## 2018-09-11 ENCOUNTER — Encounter: Payer: Self-pay | Admitting: Physical Therapy

## 2018-09-14 ENCOUNTER — Encounter: Payer: Self-pay | Admitting: Physical Therapy

## 2018-09-25 ENCOUNTER — Other Ambulatory Visit: Payer: Self-pay | Admitting: Orthopedic Surgery

## 2018-09-25 NOTE — Progress Notes (Signed)
07/29/2018-noted in Epic- CT angiography of Abdominal Aorta with Iliofemoral runoff  02/21/2018- noted in Epic-EKG

## 2018-09-25 NOTE — Patient Instructions (Signed)
Dawn Reynolds  09/25/2018   Your procedure is scheduled on: Wednesday 09/27/2018  Report to [facility] Main  Entrance              Report to admitting at   1045 AM    Call this number if you have problems the morning of surgery [phone]    Remember: Do not eat food  :After Midnight. May have clear liquids from midnight up until 0715 am then nothing until after surgery!    CLEAR LIQUID DIET   Foods Allowed                                                                     Foods Excluded  Coffee and tea, regular and decaf                             liquids that you cannot  Plain Jell-O in any flavor                                             see through such as: Fruit ices (not with fruit pulp)                                     milk, soups, orange juice  Iced Popsicles                                    All solid food Carbonated beverages, regular and diet                                    Cranberry, grape and apple juices Sports drinks like Gatorade Lightly seasoned clear broth or consume(fat free) Sugar, honey syrup  Sample Menu Breakfast                                Lunch                                     Supper Cranberry juice                    Beef broth                            Chicken broth Jell-O                                     Grape juice  Apple juice Coffee or tea                        Jell-O                                      Popsicle                                                Coffee or tea                        Coffee or tea  _____________________________________________________________________                BRUSH YOUR TEETH MORNING OF SURGERY AND RINSE YOUR MOUTH OUT, NO CHEWING GUM CANDY OR MINTS.     Take these medicines the morning of surgery with A SIP OF WATER: Amlodipine (Norvasc), Duloxetine (Cymbalta), Omeprazole (Prilosec                                You may not have  any metal on your body including hair pins and              piercings  Do not wear jewelry, make-up, lotions, powders or perfumes, deodorant             Do not wear nail polish.  Do not shave  48 hours prior to surgery.           Do not bring valuables to the hospital. [place].  Contacts, dentures or bridgework may not be worn into surgery.  Leave suitcase in the car. After surgery it may be brought to your room.                  Please read over the following fact sheets you were given: _____________________________________________________________________             [facility] - Preparing for Surgery Before surgery, you can play an important role.  Because skin is not sterile, your skin needs to be as free of germs as possible.  You can reduce the number of germs on your skin by washing with CHG (chlorahexidine gluconate) soap before surgery.  CHG is an antiseptic cleaner which kills germs and bonds with the skin to continue killing germs even after washing. Please DO NOT use if you have an allergy to CHG or antibacterial soaps.  If your skin becomes reddened/irritated stop using the CHG and inform your nurse when you arrive at Short Stay. Do not shave (including legs and underarms) for at least 48 hours prior to the first CHG shower.  You may shave your face/neck. Please follow these instructions carefully:  1.  Shower with CHG Soap the night before surgery and the  morning of Surgery.  2.  If you choose to wash your hair, wash your hair first as usual with your  normal  shampoo.  3.  After you shampoo, rinse your hair and body thoroughly to remove the  shampoo.  4.  Use CHG as you would any other liquid soap.  You can apply chg directly  to the skin and wash                       Gently with a scrungie or clean washcloth.  5.  Apply the CHG Soap to your body ONLY FROM THE NECK DOWN.   Do not use on face/ open                            Wound or open sores. Avoid contact with eyes, ears mouth and genitals (private parts).                       Wash face,  Genitals (private parts) with your normal soap.             6.  Wash thoroughly, paying special attention to the area where your surgery  will be performed.  7.  Thoroughly rinse your body with warm water from the neck down.  8.  DO NOT shower/wash with your normal soap after using and rinsing off  the CHG Soap.                9.  Pat yourself dry with a clean towel.            10.  Wear clean pajamas.            11.  Place clean sheets on your bed the night of your first shower and do not  sleep with pets. Day of Surgery : Do not apply any lotions/deodorants the morning of surgery.  Please wear clean clothes to the hospital/surgery center.  FAILURE TO FOLLOW THESE INSTRUCTIONS MAY RESULT IN THE CANCELLATION OF YOUR SURGERY PATIENT SIGNATURE_________________________________  NURSE SIGNATURE__________________________________  ________________________________________________________________________   Dawn Reynolds  An incentive spirometer is a tool that can help keep your lungs clear and active. This tool measures how well you are filling your lungs with each breath. Taking long deep breaths may help reverse or decrease the chance of developing breathing (pulmonary) problems (especially infection) following:  A long period of time when you are unable to move or be active. BEFORE THE PROCEDURE   If the spirometer includes an indicator to show your best effort, your nurse or respiratory therapist will set it to a desired goal.  If possible, sit up straight or lean slightly forward. Try not to slouch.  Hold the incentive spirometer in an upright position. INSTRUCTIONS FOR USE  1. Sit on the edge of your bed if possible, or sit up as far as you can in bed or on a chair. 2. Hold the incentive spirometer in an upright position. 3. Breathe  out normally. 4. Place the mouthpiece in your mouth and seal your lips tightly around it. 5. Breathe in slowly and as deeply as possible, raising the piston or the ball toward the top of the column. 6. Hold your breath for 3-5 seconds or for as long as possible. Allow the piston or ball to fall to the bottom of the column. 7. Remove the mouthpiece from your mouth and breathe out normally. 8. Rest for a few seconds and repeat Steps 1 through 7 at least 10 times every 1-2 hours when you are awake. Take your time and take a few normal breaths between deep breaths. 9. The spirometer may include an indicator to  show your best effort. Use the indicator as a goal to work toward during each repetition. 10. After each set of 10 deep breaths, practice coughing to be sure your lungs are clear. If you have an incision (the cut made at the time of surgery), support your incision when coughing by placing a pillow or rolled up towels firmly against it. Once you are able to get out of bed, walk around indoors and cough well. You may stop using the incentive spirometer when instructed by your caregiver.  RISKS AND COMPLICATIONS  Take your time so you do not get dizzy or light-headed.  If you are in pain, you may need to take or ask for pain medication before doing incentive spirometry. It is harder to take a deep breath if you are having pain. AFTER USE  Rest and breathe slowly and easily.  It can be helpful to keep track of a log of your progress. Your caregiver can provide you with a simple table to help with this. If you are using the spirometer at home, follow these instructions: [place] IF:   You are having difficultly using the spirometer.  You have trouble using the spirometer as often as instructed.  Your pain medication is not giving enough relief while using the spirometer.  You develop fever of 100.5 F (38.1 C) or higher. SEEK IMMEDIATE MEDICAL CARE IF:   You cough up bloody  sputum that had not been present before.  You develop fever of 102 F (38.9 C) or greater.  You develop worsening pain at or near the incision site. MAKE SURE YOU:   Understand these instructions.  Will watch your condition.  Will get help right away if you are not doing well or get worse. Document Released: 03/07/2007 Document Revised: 01/17/2012 Document Reviewed: 05/08/2007 [facility] Patient Information 2014 [place], [place].   ________________________________________________________________________

## 2018-09-26 ENCOUNTER — Encounter (HOSPITAL_COMMUNITY): Payer: Self-pay

## 2018-09-26 ENCOUNTER — Other Ambulatory Visit: Payer: Self-pay

## 2018-09-26 ENCOUNTER — Encounter: Payer: Self-pay | Admitting: Family Medicine

## 2018-09-26 ENCOUNTER — Ambulatory Visit (HOSPITAL_COMMUNITY)
Admission: RE | Admit: 2018-09-26 | Discharge: 2018-09-26 | Disposition: A | Discharge status: Home / Self Care | Payer: 59 | Source: Ambulatory Visit | Attending: Orthopedic Surgery | Admitting: Orthopedic Surgery

## 2018-09-26 ENCOUNTER — Encounter (HOSPITAL_COMMUNITY)
Admission: RE | Admit: 2018-09-26 | Discharge: 2018-09-26 | Disposition: A | Discharge status: Home / Self Care | Payer: 59 | Source: Ambulatory Visit | Attending: Orthopedic Surgery | Admitting: Orthopedic Surgery

## 2018-09-26 DIAGNOSIS — I10 Essential (primary) hypertension: Secondary | ICD-10-CM | POA: Diagnosis not present

## 2018-09-26 DIAGNOSIS — E669 Obesity, unspecified: Secondary | ICD-10-CM | POA: Diagnosis not present

## 2018-09-26 DIAGNOSIS — M797 Fibromyalgia: Secondary | ICD-10-CM | POA: Diagnosis not present

## 2018-09-26 DIAGNOSIS — Z01818 Encounter for other preprocedural examination: Secondary | ICD-10-CM

## 2018-09-26 DIAGNOSIS — M170 Bilateral primary osteoarthritis of knee: Secondary | ICD-10-CM

## 2018-09-26 DIAGNOSIS — K219 Gastro-esophageal reflux disease without esophagitis: Secondary | ICD-10-CM | POA: Diagnosis not present

## 2018-09-26 DIAGNOSIS — D62 Acute posthemorrhagic anemia: Secondary | ICD-10-CM | POA: Diagnosis not present

## 2018-09-26 DIAGNOSIS — J984 Other disorders of lung: Secondary | ICD-10-CM | POA: Diagnosis not present

## 2018-09-26 DIAGNOSIS — M25761 Osteophyte, right knee: Secondary | ICD-10-CM | POA: Diagnosis not present

## 2018-09-26 DIAGNOSIS — Z6838 Body mass index (BMI) 38.0-38.9, adult: Secondary | ICD-10-CM | POA: Diagnosis not present

## 2018-09-26 DIAGNOSIS — M25762 Osteophyte, left knee: Secondary | ICD-10-CM | POA: Diagnosis not present

## 2018-09-26 LAB — CBC WITH DIFFERENTIAL/PLATELET
ABS IMMATURE GRANULOCYTES: 0.02 10*3/uL (ref 0.00–0.07)
BASOS ABS: 0.1 10*3/uL (ref 0.0–0.1)
BASOS PCT: 1 %
EOS PCT: 3 %
Eosinophils Absolute: 0.2 10*3/uL (ref 0.0–0.5)
HCT: 45.9 % (ref 36.0–46.0)
HEMOGLOBIN: 14.6 g/dL (ref 12.0–15.0)
Immature Granulocytes: 0 %
LYMPHS PCT: 37 %
Lymphs Abs: 2.4 10*3/uL (ref 0.7–4.0)
MCH: 28.6 pg (ref 26.0–34.0)
MCHC: 31.8 g/dL (ref 30.0–36.0)
MCV: 90 fL (ref 80.0–100.0)
Monocytes Absolute: 0.6 10*3/uL (ref 0.1–1.0)
Monocytes Relative: 9 %
NEUTROS ABS: 3.2 10*3/uL (ref 1.7–7.7)
NRBC: 0 % (ref 0.0–0.2)
Neutrophils Relative %: 50 %
PLATELETS: 274 10*3/uL (ref 150–400)
RBC: 5.1 MIL/uL (ref 3.87–5.11)
RDW: 14.7 % (ref 11.5–15.5)
WBC: 6.5 10*3/uL (ref 4.0–10.5)

## 2018-09-26 LAB — URINALYSIS, ROUTINE W REFLEX MICROSCOPIC
BILIRUBIN URINE: NEGATIVE
Glucose, UA: NEGATIVE mg/dL
KETONES UR: NEGATIVE mg/dL
NITRITE: POSITIVE — AB
PROTEIN: NEGATIVE mg/dL
SPECIFIC GRAVITY, URINE: 1.018 (ref 1.005–1.030)
pH: 6 (ref 5.0–8.0)

## 2018-09-26 LAB — BASIC METABOLIC PANEL
Anion gap: 7 (ref 5–15)
BUN: 19 mg/dL (ref 6–20)
CHLORIDE: 106 mmol/L (ref 98–111)
CO2: 30 mmol/L (ref 22–32)
Calcium: 9.1 mg/dL (ref 8.9–10.3)
Creatinine, Ser: 1.11 mg/dL — ABNORMAL HIGH (ref 0.44–1.00)
GFR, EST NON AFRICAN AMERICAN: 56 mL/min — AB (ref >60)
Glucose, Bld: 86 mg/dL (ref 70–99)
POTASSIUM: 4.5 mmol/L (ref 3.5–5.1)
SODIUM: 143 mmol/L (ref 135–145)

## 2018-09-26 LAB — PROTIME-INR
INR: 0.89
PROTHROMBIN TIME: 12 s (ref 11.4–15.2)

## 2018-09-26 LAB — SURGICAL PCR SCREEN
MRSA, PCR: NEGATIVE
Staphylococcus aureus: POSITIVE — AB

## 2018-09-26 LAB — APTT: APTT: 29 s (ref 24–36)

## 2018-09-26 MED ORDER — BUPIVACAINE LIPOSOME 1.3 % IJ SUSP
20.0000 mL | Freq: Once | INTRAMUSCULAR | Status: DC
  Filled 2018-09-26: qty 20

## 2018-09-26 MED ORDER — TRANEXAMIC ACID 1000 MG/10ML IV SOLN
2000.0000 mg | INTRAVENOUS | Status: DC
  Filled 2018-09-26: qty 20

## 2018-09-26 NOTE — H&P (Signed)
TOTAL KNEE ADMISSION H&P  Patient is being admitted for bilaterally total knee arthroplasty.  Subjective:  Chief Complaint:bilaterally knee pain.  HPI: 26, 52 y.o. female, has a history of pain and functional disability in the bilaterally knee due to arthritis and has failed non-surgical conservative treatments for greater than 12 weeks to includeNSAID's and/or analgesics, corticosteriod injections, flexibility and strengthening excercises, weight reduction as appropriate and activity modification.  Onset of symptoms was gradual, starting 1 years ago with rapidlly worsening course since that time. The patient noted no past surgery on the bilaterally knee(s).  Patient currently rates pain in the bilaterally knee(s) at 10 out of 10 with activity. Patient has night pain, worsening of pain with activity and weight bearing, pain that interferes with activities of daily living, pain with passive range of motion, crepitus and joint swelling.  Patient has evidence of joint space narrowing by imaging studies.  There is no active infection.  Patient Active Problem List   Diagnosis Date Noted  . Obesity (BMI 35.0-39.9 without comorbidity) 04/06/2018  . High triglycerides 04/06/2018  . Chest pain 07/09/2017  . Fibromyalgia 07/09/2017  . Hypertension 07/09/2017   Past Medical History:  Diagnosis Date  . Anemia    hx of  . Arthritis   . Cataract    as a child  . Chicken pox   . Depression    hx of in past situational  . Fibromyalgia   . GERD (gastroesophageal reflux disease)   . Headache    hx of migraines  . History of kidney stones    1996  . Hypertension     Past Surgical History:  Procedure Laterality Date  . ABDOMINAL HYSTERECTOMY    . CHOLECYSTECTOMY     03-31-18 [name]  . CHOLECYSTECTOMY N/A 03/31/2018   Procedure: LAPAROSCOPIC CHOLECYSTECTOMY;  Surgeon: Ralene Ok, MD;  Location: [facility];  Service: General;  Laterality: N/A;  . COLONOSCOPY    . endoplantar  fasciotomy    . GALLBLADDER SURGERY  03/31/2018  . KNEE ARTHROSCOPY     left knee 2012  . LAPAROSCOPIC CHOLECYSTECTOMY    . TONSILLECTOMY     1999  . UPPER GASTROINTESTINAL ENDOSCOPY      No current facility-administered medications for this encounter.    Current Outpatient Medications  Medication Sig Dispense Refill Last Dose  . amLODipine (NORVASC) 10 MG tablet Take 1 tablet (10 mg total) by mouth daily. 90 tablet 3 Taking  . B Complex Vitamins (B COMPLEX 1 PO) Take 1 tablet by mouth daily.    Taking  . Biotin 5000 MCG CAPS Take 5,000 mcg by mouth daily.     . Calcium Carb-Cholecalciferol (CALCIUM 600+D) 600-800 MG-UNIT TABS Take 1 tablet by mouth daily.     . cholecalciferol (VITAMIN D3) 25 MCG (1000 UT) tablet Take 1,000 Units by mouth daily.     . cyclobenzaprine (FLEXERIL) 10 MG tablet Take 1 tablet (10 mg total) by mouth 2 (two) times daily as needed for muscle spasms. (Patient taking differently: Take 10 mg by mouth at bedtime as needed for muscle spasms. ) 30 tablet 1 Taking  . DULoxetine (CYMBALTA) 60 MG capsule Take 1 capsule (60 mg total) by mouth 2 (two) times daily. 180 capsule 3 Taking  . Flaxseed, Linseed, (FLAX SEED OIL PO) Take 1,400 mg by mouth daily.     . Garlic (GARLIQUE PO) Take 1 tablet by mouth daily.     . hydrochlorothiazide (MICROZIDE) 12.5 MG capsule Take 1 capsule (12.5 mg  total) by mouth daily. 90 capsule 3 Taking  . ibuprofen (ADVIL,MOTRIN) 200 MG tablet Take 800 mg by mouth daily as needed (knee pain).     . meloxicam (MOBIC) 15 MG tablet Take 1 tablet (15 mg total) by mouth daily. 90 tablet 3 Taking  . methocarbamol (ROBAXIN) 500 MG tablet Take 500 mg by mouth daily as needed for muscle spasms.   1 Taking  . nitrofurantoin, macrocrystal-monohydrate, (MACROBID) 100 MG capsule Take 1 capsule daily as needed following intercourse to prevent UTI (Patient taking differently: Take 100 mg by mouth See admin instructions. Take 100 mg  daily as needed following  intercourse to prevent UTI) 30 capsule 2 Taking  . Omega-3 1000 MG CAPS Take 1,000 mg by mouth daily.     Marland Kitchen omeprazole (PRILOSEC) 20 MG capsule Take 1 capsule (20 mg total) by mouth daily. 90 capsule 3 Taking  . TURMERIC PO Take 1 capsule by mouth daily.     . vitamin C (ASCORBIC ACID) 500 MG tablet Take 500 mg by mouth daily.     Marland Kitchen atorvastatin (LIPITOR) 20 MG tablet Take 1 tablet (20 mg total) by mouth daily. (Patient not taking: Reported on 09/21/2018) 90 tablet 3 Not Taking at Unknown time  . oxyCODONE-acetaminophen (PERCOCET/ROXICET) 5-325 MG tablet Take 1 tablet by mouth every 8 (eight) hours as needed for severe pain. (Patient not taking: Reported on 09/21/2018) 12 tablet 0 Completed Course at Unknown time   Allergies  Allergen Reactions  . Aspartame Other (See Comments)    Headaches.  . Demerol [Meperidine] Nausea And Vomiting    Social History   Tobacco Use  . Smoking status: Never Smoker  . Smokeless tobacco: Never Used  Substance Use Topics  . Alcohol use: Yes    Comment: occ    Family History  Problem Relation Age of Onset  . Heart disease Father   . Prostate cancer Father   . Diabetes Father   . Irritable bowel syndrome Father   . Kidney disease Father   . Mitral valve prolapse Mother   . Heart disease Mother   . Pancreatic cancer Maternal Aunt   . Liver cancer Paternal Uncle   . Colon cancer Paternal Uncle   . Esophageal cancer Neg Hx   . Stomach cancer Neg Hx   . Rectal cancer Neg Hx      Review of Systems  Constitutional: Negative.   HENT: Negative.   Eyes: Negative.   Respiratory: Negative.   Cardiovascular:       HTN  Gastrointestinal: Negative.   Genitourinary: Negative.   Musculoskeletal: Positive for joint pain.       Fibromyalga  Skin: Negative.   Neurological: Negative.   Endo/Heme/Allergies: Negative.   Psychiatric/Behavioral: Negative.     Objective:  Physical Exam  Constitutional: She is oriented to person, place, and time. She  appears well-developed and well-nourished.  HENT:  Head: Normocephalic and atraumatic.  Eyes: Pupils are equal, round, and reactive to light.  Neck: Normal range of motion. Neck supple.  Cardiovascular: Intact distal pulses.  Respiratory: Effort normal.  Musculoskeletal: She exhibits tenderness.  the patient has good strength bilaterally in her knees.  She does have tenderness to the medial joint line bilaterally.  Patient's right knee has a range from 5 to 110.  No instability.  Obvious crepitance with range of motion.  Her calves are soft and nontender.  She is neurovascular intact distally.  Neurological: She is alert and oriented to person, place, and time.  Skin: Skin is warm and dry.  Psychiatric: She has a normal mood and affect. Her behavior is normal. Judgment and thought content normal.    Vital signs in last 24 hours: BP: ()/()  Arterial Line BP: ()/()   Labs:   Estimated body mass index is 38.58 kg/m as calculated from the following:   Height as of 07/31/18: 5\' 6"  (1.676 m).   Weight as of 07/31/18: 108.4 kg.   Imaging Review Plain radiographs demonstrate  bilateral AP weightbearing, bilateral [place]berg, lateral sunrise views of the right knee are taken and reviewed in office today.  Patient's right knee does have near end-stage arthritis medial compartment on the [place] view.  Patient's left knee does have end-stage arthritis medial compartment.   Preoperative templating of the joint replacement has been completed, documented, and submitted to the Operating Room personnel in order to optimize intra-operative equipment management.   Anticipated LOS equal to or greater than 2 midnights due to - Age 55 and older with one or more of the following:  - Obesity  - Expected need for hospital services (PT, OT, Nursing) required for safe  discharge  - Anticipated need for postoperative skilled nursing care or inpatient rehab   Assessment/Plan:  End stage arthritis,  bilaterally knee   The patient history, physical examination, clinical judgment of the provider and imaging studies are consistent with end stage degenerative joint disease of the bilaterally knee(s) and total knee arthroplasty is deemed medically necessary. The treatment options including medical management, injection therapy arthroscopy and arthroplasty were discussed at length. The risks and benefits of total knee arthroplasty were presented and reviewed. The risks due to aseptic loosening, infection, stiffness, patella tracking problems, thromboembolic complications and other imponderables were discussed. The patient acknowledged the explanation, agreed to proceed with the plan and consent was signed. Patient is being admitted for inpatient treatment for surgery, pain control, PT, OT, prophylactic antibiotics, VTE prophylaxis, progressive ambulation and ADL's and discharge planning. The patient is planning to be discharged home with home health services

## 2018-09-26 NOTE — Progress Notes (Signed)
   09/26/18 1343  OBSTRUCTIVE SLEEP APNEA  Have you ever been diagnosed with sleep apnea through a sleep study? No  Do you snore loudly (loud enough to be heard through closed doors)?  0  Do you often feel tired, fatigued, or sleepy during the daytime (such as falling asleep during driving or talking to someone)? 1  Has anyone observed you stop breathing during your sleep? 0  Do you have, or are you being treated for high blood pressure? 1  BMI more than 35 kg/m2? 1  Age > 50 (1-yes) 1  Neck circumference greater than:Female 16 inches or larger, Female 17inches or larger? 1  Female Gender (Yes=1) 0  Obstructive Sleep Apnea Score 5  Score 5 or greater  Results sent to PCP

## 2018-09-27 ENCOUNTER — Encounter (HOSPITAL_COMMUNITY): Payer: Self-pay

## 2018-09-27 ENCOUNTER — Inpatient Hospital Stay (HOSPITAL_COMMUNITY): Payer: 59 | Admitting: Anesthesiology

## 2018-09-27 ENCOUNTER — Inpatient Hospital Stay (HOSPITAL_COMMUNITY)
Admission: RE | Admit: 2018-09-27 | Discharge: 2018-09-28 | DRG: 462 | Disposition: A | Discharge status: Home Health | Payer: 59 | Attending: Orthopedic Surgery | Admitting: Orthopedic Surgery

## 2018-09-27 ENCOUNTER — Other Ambulatory Visit: Payer: Self-pay

## 2018-09-27 ENCOUNTER — Encounter (HOSPITAL_COMMUNITY)
Admission: RE | Disposition: A | Discharge status: Home Health | Payer: Self-pay | Source: Home / Self Care | Attending: Orthopedic Surgery

## 2018-09-27 DIAGNOSIS — E669 Obesity, unspecified: Secondary | ICD-10-CM | POA: Diagnosis present

## 2018-09-27 DIAGNOSIS — K219 Gastro-esophageal reflux disease without esophagitis: Secondary | ICD-10-CM | POA: Diagnosis present

## 2018-09-27 DIAGNOSIS — Z87442 Personal history of urinary calculi: Secondary | ICD-10-CM | POA: Diagnosis not present

## 2018-09-27 DIAGNOSIS — I10 Essential (primary) hypertension: Secondary | ICD-10-CM | POA: Diagnosis not present

## 2018-09-27 DIAGNOSIS — M25761 Osteophyte, right knee: Secondary | ICD-10-CM | POA: Diagnosis present

## 2018-09-27 DIAGNOSIS — M170 Bilateral primary osteoarthritis of knee: Secondary | ICD-10-CM | POA: Diagnosis not present

## 2018-09-27 DIAGNOSIS — Z96653 Presence of artificial knee joint, bilateral: Secondary | ICD-10-CM

## 2018-09-27 DIAGNOSIS — M797 Fibromyalgia: Secondary | ICD-10-CM | POA: Diagnosis present

## 2018-09-27 DIAGNOSIS — Z6838 Body mass index (BMI) 38.0-38.9, adult: Secondary | ICD-10-CM | POA: Diagnosis not present

## 2018-09-27 DIAGNOSIS — Z9102 Food additives allergy status: Secondary | ICD-10-CM

## 2018-09-27 DIAGNOSIS — Z79899 Other long term (current) drug therapy: Secondary | ICD-10-CM

## 2018-09-27 DIAGNOSIS — Z791 Long term (current) use of non-steroidal anti-inflammatories (NSAID): Secondary | ICD-10-CM | POA: Diagnosis not present

## 2018-09-27 DIAGNOSIS — D62 Acute posthemorrhagic anemia: Secondary | ICD-10-CM | POA: Diagnosis not present

## 2018-09-27 DIAGNOSIS — Z885 Allergy status to narcotic agent status: Secondary | ICD-10-CM | POA: Diagnosis not present

## 2018-09-27 DIAGNOSIS — M25562 Pain in left knee: Secondary | ICD-10-CM | POA: Diagnosis present

## 2018-09-27 DIAGNOSIS — M25762 Osteophyte, left knee: Secondary | ICD-10-CM | POA: Diagnosis present

## 2018-09-27 DIAGNOSIS — G8918 Other acute postprocedural pain: Secondary | ICD-10-CM | POA: Diagnosis not present

## 2018-09-27 HISTORY — PX: REPLACEMENT TOTAL KNEE BILATERAL: SUR1225

## 2018-09-27 HISTORY — PX: TOTAL KNEE ARTHROPLASTY: SHX125

## 2018-09-27 LAB — ABO/RH: ABO/RH(D): B POS

## 2018-09-27 LAB — PREPARE RBC (CROSSMATCH)

## 2018-09-27 SURGERY — ARTHROPLASTY, KNEE, BILATERAL, TOTAL
Anesthesia: Spinal | Site: Knee | Laterality: Bilateral

## 2018-09-27 MED ORDER — CEFAZOLIN SODIUM-DEXTROSE 2-4 GM/100ML-% IV SOLN
2.0000 g | INTRAVENOUS | Status: AC
  Administered 2018-09-27: 2 g via INTRAVENOUS
  Filled 2018-09-27: qty 100

## 2018-09-27 MED ORDER — PANTOPRAZOLE SODIUM 40 MG PO TBEC: 40.0000 mg | DELAYED_RELEASE_TABLET | Freq: Every day | ORAL | Status: DC

## 2018-09-27 MED ORDER — HYDROMORPHONE HCL 1 MG/ML IJ SOLN
0.5000 mg | INTRAMUSCULAR | Status: DC | PRN
  Administered 2018-09-27: 0.5 mg via INTRAVENOUS
  Administered 2018-09-28: 1 mg via INTRAVENOUS
  Filled 2018-09-27: qty 1

## 2018-09-27 MED ORDER — OXYCODONE HCL 5 MG PO TABS
ORAL_TABLET | ORAL | Status: AC
  Filled 2018-09-27: qty 1

## 2018-09-27 MED ORDER — DEXAMETHASONE SODIUM PHOSPHATE 10 MG/ML IJ SOLN
INTRAMUSCULAR | Status: DC | PRN
  Administered 2018-09-27: 10 mg via INTRAVENOUS

## 2018-09-27 MED ORDER — PROPOFOL 10 MG/ML IV BOLUS
INTRAVENOUS | Status: AC
  Filled 2018-09-27: qty 40

## 2018-09-27 MED ORDER — METHOCARBAMOL 500 MG PO TABS: 500.0000 mg | ORAL_TABLET | Freq: Two times a day (BID) | ORAL | 0 refills | Status: DC

## 2018-09-27 MED ORDER — MIDAZOLAM HCL 2 MG/2ML IJ SOLN
INTRAMUSCULAR | Status: AC
  Filled 2018-09-27: qty 2

## 2018-09-27 MED ORDER — ONDANSETRON HCL 4 MG PO TABS: 4.0000 mg | ORAL_TABLET | Freq: Four times a day (QID) | ORAL | Status: DC | PRN

## 2018-09-27 MED ORDER — OXYCODONE-ACETAMINOPHEN 5-325 MG PO TABS: 1.0000 | ORAL_TABLET | ORAL | 0 refills | Status: DC | PRN

## 2018-09-27 MED ORDER — DOCUSATE SODIUM 100 MG PO CAPS
100.0000 mg | ORAL_CAPSULE | Freq: Two times a day (BID) | ORAL | Status: DC
  Administered 2018-09-27 – 2018-09-28 (×2): 100 mg via ORAL
  Filled 2018-09-27 (×2): qty 1

## 2018-09-27 MED ORDER — CHLORHEXIDINE GLUCONATE 4 % EX LIQD: 60.0000 mL | Freq: Once | CUTANEOUS | Status: DC

## 2018-09-27 MED ORDER — CELECOXIB 200 MG PO CAPS: 200.0000 mg | ORAL_CAPSULE | Freq: Two times a day (BID) | ORAL | 2 refills | Status: DC

## 2018-09-27 MED ORDER — METHOCARBAMOL 500 MG IVPB - SIMPLE MED
500.0000 mg | Freq: Four times a day (QID) | INTRAVENOUS | Status: DC | PRN
  Filled 2018-09-27: qty 50

## 2018-09-27 MED ORDER — GABAPENTIN 300 MG PO CAPS: 300.0000 mg | ORAL_CAPSULE | Freq: Three times a day (TID) | ORAL | 2 refills | Status: DC

## 2018-09-27 MED ORDER — BUPIVACAINE-EPINEPHRINE (PF) 0.25% -1:200000 IJ SOLN
INTRAMUSCULAR | Status: AC
  Filled 2018-09-27: qty 30

## 2018-09-27 MED ORDER — ROPIVACAINE HCL 5 MG/ML IJ SOLN
INTRAMUSCULAR | Status: DC | PRN
  Administered 2018-09-27 (×2): 20 mL via PERINEURAL

## 2018-09-27 MED ORDER — EPHEDRINE SULFATE-NACL 50-0.9 MG/10ML-% IV SOSY
PREFILLED_SYRINGE | INTRAVENOUS | Status: DC | PRN
  Administered 2018-09-27: 15 mg via INTRAVENOUS

## 2018-09-27 MED ORDER — PROPOFOL 10 MG/ML IV BOLUS
INTRAVENOUS | Status: DC | PRN
  Administered 2018-09-27 (×2): 20 mg via INTRAVENOUS

## 2018-09-27 MED ORDER — OXYCODONE HCL 5 MG PO TABS
5.0000 mg | ORAL_TABLET | ORAL | Status: DC | PRN
  Administered 2018-09-27 – 2018-09-28 (×4): 10 mg via ORAL
  Filled 2018-09-27 (×4): qty 2

## 2018-09-27 MED ORDER — SODIUM CHLORIDE (PF) 0.9 % IJ SOLN
INTRAMUSCULAR | Status: DC | PRN
  Administered 2018-09-27: 70 mL

## 2018-09-27 MED ORDER — BUPIVACAINE HCL (PF) 0.5 % IJ SOLN
INTRAMUSCULAR | Status: DC | PRN
  Administered 2018-09-27: 3 mL

## 2018-09-27 MED ORDER — POLYETHYLENE GLYCOL 3350 17 G PO PACK: 17.0000 g | PACK | Freq: Every day | ORAL | Status: DC | PRN

## 2018-09-27 MED ORDER — DEXAMETHASONE SODIUM PHOSPHATE 10 MG/ML IJ SOLN
10.0000 mg | Freq: Once | INTRAMUSCULAR | Status: AC
  Administered 2018-09-28: 10 mg via INTRAVENOUS
  Filled 2018-09-27: qty 1

## 2018-09-27 MED ORDER — TRANEXAMIC ACID-NACL 1000-0.7 MG/100ML-% IV SOLN
1000.0000 mg | INTRAVENOUS | Status: AC
  Administered 2018-09-27: 1000 mg via INTRAVENOUS
  Filled 2018-09-27 (×2): qty 100

## 2018-09-27 MED ORDER — CLONIDINE HCL (ANALGESIA) 100 MCG/ML EP SOLN
EPIDURAL | Status: DC | PRN
  Administered 2018-09-27: 30 ug

## 2018-09-27 MED ORDER — KCL IN DEXTROSE-NACL 20-5-0.45 MEQ/L-%-% IV SOLN
INTRAVENOUS | Status: DC
  Administered 2018-09-27 – 2018-09-28 (×2): via INTRAVENOUS
  Filled 2018-09-27 (×3): qty 1000

## 2018-09-27 MED ORDER — SODIUM CHLORIDE (PF) 0.9 % IJ SOLN
INTRAMUSCULAR | Status: AC
  Filled 2018-09-27: qty 50

## 2018-09-27 MED ORDER — MENTHOL 3 MG MT LOZG: 1.0000 | LOZENGE | OROMUCOSAL | Status: DC | PRN

## 2018-09-27 MED ORDER — ONDANSETRON HCL 4 MG/2ML IJ SOLN: 4.0000 mg | Freq: Once | INTRAMUSCULAR | Status: DC | PRN

## 2018-09-27 MED ORDER — PROPOFOL 500 MG/50ML IV EMUL
INTRAVENOUS | Status: DC | PRN
  Administered 2018-09-27: 100 ug/kg/min via INTRAVENOUS

## 2018-09-27 MED ORDER — FLEET ENEMA 7-19 GM/118ML RE ENEM: 1.0000 | ENEMA | Freq: Once | RECTAL | Status: DC | PRN

## 2018-09-27 MED ORDER — AMLODIPINE BESYLATE 10 MG PO TABS: 10.0000 mg | ORAL_TABLET | Freq: Every day | ORAL | Status: DC

## 2018-09-27 MED ORDER — PROPOFOL 10 MG/ML IV BOLUS
INTRAVENOUS | Status: AC
  Filled 2018-09-27: qty 60

## 2018-09-27 MED ORDER — LACTATED RINGERS IV SOLN: INTRAVENOUS | Status: DC

## 2018-09-27 MED ORDER — BUPIVACAINE LIPOSOME 1.3 % IJ SUSP
20.0000 mL | Freq: Once | INTRAMUSCULAR | Status: AC
  Administered 2018-09-27: 20 mL
  Filled 2018-09-27: qty 20

## 2018-09-27 MED ORDER — LIDOCAINE 2% (20 MG/ML) 5 ML SYRINGE
INTRAMUSCULAR | Status: AC
  Filled 2018-09-27: qty 5

## 2018-09-27 MED ORDER — LIDOCAINE 2% (20 MG/ML) 5 ML SYRINGE
INTRAMUSCULAR | Status: DC | PRN
  Administered 2018-09-27: 40 mg via INTRAVENOUS

## 2018-09-27 MED ORDER — OXYCODONE HCL 5 MG PO TABS
5.0000 mg | ORAL_TABLET | Freq: Once | ORAL | Status: AC | PRN
  Administered 2018-09-27: 5 mg via ORAL

## 2018-09-27 MED ORDER — LACTATED RINGERS IV SOLN
INTRAVENOUS | Status: DC
  Administered 2018-09-27: 15:00:00 via INTRAVENOUS
  Administered 2018-09-27: 1000 mL via INTRAVENOUS

## 2018-09-27 MED ORDER — ASPIRIN 81 MG PO CHEW
81.0000 mg | CHEWABLE_TABLET | Freq: Two times a day (BID) | ORAL | Status: DC
  Administered 2018-09-27 – 2018-09-28 (×2): 81 mg via ORAL
  Filled 2018-09-27 (×2): qty 1

## 2018-09-27 MED ORDER — METOCLOPRAMIDE HCL 5 MG/ML IJ SOLN: 5.0000 mg | Freq: Three times a day (TID) | INTRAMUSCULAR | Status: DC | PRN

## 2018-09-27 MED ORDER — SODIUM CHLORIDE 0.9 % IR SOLN
Status: DC | PRN
  Administered 2018-09-27: 1000 mL
  Administered 2018-09-27: 2000 mL

## 2018-09-27 MED ORDER — GABAPENTIN 300 MG PO CAPS
300.0000 mg | ORAL_CAPSULE | Freq: Three times a day (TID) | ORAL | Status: DC
  Administered 2018-09-27 – 2018-09-28 (×2): 300 mg via ORAL
  Filled 2018-09-27 (×2): qty 1

## 2018-09-27 MED ORDER — BISACODYL 5 MG PO TBEC: 5.0000 mg | DELAYED_RELEASE_TABLET | Freq: Every day | ORAL | Status: DC | PRN

## 2018-09-27 MED ORDER — MIDAZOLAM HCL 2 MG/2ML IJ SOLN
1.0000 mg | INTRAMUSCULAR | Status: DC
  Administered 2018-09-27: 2 mg via INTRAVENOUS

## 2018-09-27 MED ORDER — METOCLOPRAMIDE HCL 5 MG PO TABS: 5.0000 mg | ORAL_TABLET | Freq: Three times a day (TID) | ORAL | Status: DC | PRN

## 2018-09-27 MED ORDER — PHENOL 1.4 % MT LIQD: 1.0000 | OROMUCOSAL | Status: DC | PRN

## 2018-09-27 MED ORDER — DIPHENHYDRAMINE HCL 12.5 MG/5ML PO ELIX: 12.5000 mg | ORAL_SOLUTION | ORAL | Status: DC | PRN

## 2018-09-27 MED ORDER — FENTANYL CITRATE (PF) 100 MCG/2ML IJ SOLN: 25.0000 ug | INTRAMUSCULAR | Status: DC | PRN

## 2018-09-27 MED ORDER — DULOXETINE HCL 60 MG PO CPEP
60.0000 mg | ORAL_CAPSULE | Freq: Two times a day (BID) | ORAL | Status: DC
  Administered 2018-09-27: 60 mg via ORAL
  Filled 2018-09-27: qty 1

## 2018-09-27 MED ORDER — ONDANSETRON HCL 4 MG/2ML IJ SOLN: 4.0000 mg | Freq: Four times a day (QID) | INTRAMUSCULAR | Status: DC | PRN

## 2018-09-27 MED ORDER — TRANEXAMIC ACID-NACL 1000-0.7 MG/100ML-% IV SOLN: 1000.0000 mg | INTRAVENOUS | Status: DC

## 2018-09-27 MED ORDER — BUPIVACAINE-EPINEPHRINE (PF) 0.25% -1:200000 IJ SOLN
INTRAMUSCULAR | Status: DC | PRN
  Administered 2018-09-27: 30 mL

## 2018-09-27 MED ORDER — HYDROMORPHONE HCL 1 MG/ML IJ SOLN
INTRAMUSCULAR | Status: AC
  Filled 2018-09-27: qty 1

## 2018-09-27 MED ORDER — OXYCODONE HCL 5 MG/5ML PO SOLN
5.0000 mg | Freq: Once | ORAL | Status: AC | PRN
  Filled 2018-09-27: qty 5

## 2018-09-27 MED ORDER — ALUM & MAG HYDROXIDE-SIMETH 200-200-20 MG/5ML PO SUSP: 30.0000 mL | ORAL | Status: DC | PRN

## 2018-09-27 MED ORDER — ASPIRIN EC 81 MG PO TBEC: 81.0000 mg | DELAYED_RELEASE_TABLET | Freq: Two times a day (BID) | ORAL | 0 refills | Status: AC

## 2018-09-27 MED ORDER — METHOCARBAMOL 500 MG PO TABS
500.0000 mg | ORAL_TABLET | Freq: Four times a day (QID) | ORAL | Status: DC | PRN
  Administered 2018-09-27 – 2018-09-28 (×3): 500 mg via ORAL
  Filled 2018-09-27 (×3): qty 1

## 2018-09-27 MED ORDER — FENTANYL CITRATE (PF) 100 MCG/2ML IJ SOLN
50.0000 ug | INTRAMUSCULAR | Status: DC
  Administered 2018-09-27: 50 ug via INTRAVENOUS

## 2018-09-27 MED ORDER — FENTANYL CITRATE (PF) 100 MCG/2ML IJ SOLN
INTRAMUSCULAR | Status: AC
  Filled 2018-09-27: qty 2

## 2018-09-27 MED ORDER — TRANEXAMIC ACID-NACL 1000-0.7 MG/100ML-% IV SOLN
1000.0000 mg | Freq: Once | INTRAVENOUS | Status: AC
  Administered 2018-09-27: 1000 mg via INTRAVENOUS
  Filled 2018-09-27: qty 100

## 2018-09-27 MED ORDER — DEXAMETHASONE SODIUM PHOSPHATE 10 MG/ML IJ SOLN
INTRAMUSCULAR | Status: AC
  Filled 2018-09-27: qty 1

## 2018-09-27 MED ORDER — STERILE WATER FOR IRRIGATION IR SOLN
Status: DC | PRN
  Administered 2018-09-27: 2000 mL

## 2018-09-27 MED ORDER — ACETAMINOPHEN 325 MG PO TABS: 325.0000 mg | ORAL_TABLET | Freq: Four times a day (QID) | ORAL | Status: DC | PRN

## 2018-09-27 MED ORDER — HYDROCHLOROTHIAZIDE 12.5 MG PO CAPS: 12.5000 mg | ORAL_CAPSULE | Freq: Every day | ORAL | Status: DC

## 2018-09-27 MED ORDER — ONDANSETRON HCL 4 MG/2ML IJ SOLN
INTRAMUSCULAR | Status: AC
  Filled 2018-09-27: qty 2

## 2018-09-27 MED ORDER — SODIUM CHLORIDE (PF) 0.9 % IJ SOLN
INTRAMUSCULAR | Status: AC
  Filled 2018-09-27: qty 20

## 2018-09-27 SURGICAL SUPPLY — 76 items
ATTUNE MED DOME PAT 38 KNEE (Knees) ×2 IMPLANT
ATTUNE PS FEM LT SZ 7 CEM KNEE (Femur) ×1 IMPLANT
ATTUNE PS FEM RT SZ 6 CEM KNEE (Femur) ×1 IMPLANT
ATTUNE PSRP INSR SZ6 5 KNEE (Insert) ×1 IMPLANT
ATTUNE PSRP INSR SZ7 5 KNEE (Insert) ×1 IMPLANT
BAG SPEC THK2 15X12 ZIP CLS (MISCELLANEOUS) ×1
BAG ZIPLOCK 12X15 (MISCELLANEOUS) ×2 IMPLANT
BANDAGE ACE 4X5 VEL STRL LF (GAUZE/BANDAGES/DRESSINGS) ×3 IMPLANT
BANDAGE ACE 6X5 VEL STRL LF (GAUZE/BANDAGES/DRESSINGS) ×3 IMPLANT
BANDAGE ESMARK 6X9 LF (GAUZE/BANDAGES/DRESSINGS) ×1 IMPLANT
BASE TIBIA ATTUNE KNEE SYS SZ6 (Knees) IMPLANT
BLADE SAG 18X100X1.27 (BLADE) ×2 IMPLANT
BLADE SAW SGTL 11.0X1.19X90.0M (BLADE) ×2 IMPLANT
BLADE SURG SZ10 CARB STEEL (BLADE) ×2 IMPLANT
BNDG CMPR 9X6 STRL LF SNTH (GAUZE/BANDAGES/DRESSINGS) ×1
BNDG CMPR MED 10X6 ELC LF (GAUZE/BANDAGES/DRESSINGS) ×2
BNDG COHESIVE 6X5 TAN STRL LF (GAUZE/BANDAGES/DRESSINGS) ×2 IMPLANT
BNDG ELASTIC 6X10 VLCR STRL LF (GAUZE/BANDAGES/DRESSINGS) ×2 IMPLANT
BNDG ESMARK 6X9 LF (GAUZE/BANDAGES/DRESSINGS) ×2
BSPLAT TIB 6 CMNT ROT PLAT STR (Knees) ×2 IMPLANT
CEMENT HV SMART SET (Cement) ×4 IMPLANT
COVER SURGICAL LIGHT HANDLE (MISCELLANEOUS) ×2 IMPLANT
COVER WAND RF STERILE (DRAPES) ×1 IMPLANT
CUFF TOURN SGL QUICK 34 (TOURNIQUET CUFF) ×4
CUFF TRNQT CYL 34X4X40X1 (TOURNIQUET CUFF) ×1 IMPLANT
DRAPE EXTREMITY BILATERAL (DRAPES) ×1 IMPLANT
DRAPE ORTHO SPLIT 77X108 STRL (DRAPES) ×4
DRAPE POUCH INSTRU U-SHP 10X18 (DRAPES) ×3 IMPLANT
DRAPE SHEET LG 3/4 BI-LAMINATE (DRAPES) ×2 IMPLANT
DRAPE SURG ORHT 6 SPLT 77X108 (DRAPES) ×2 IMPLANT
DRAPE U-SHAPE 47X51 STRL (DRAPES) ×3 IMPLANT
DRESSING AQUACEL AG SP 3.5X10 (GAUZE/BANDAGES/DRESSINGS) IMPLANT
DRSG ADAPTIC 3X8 NADH LF (GAUZE/BANDAGES/DRESSINGS) ×3 IMPLANT
DRSG AQUACEL AG SP 3.5X10 (GAUZE/BANDAGES/DRESSINGS) ×4
DRSG PAD ABDOMINAL 8X10 ST (GAUZE/BANDAGES/DRESSINGS) ×2 IMPLANT
ELECT REM PT RETURN 15FT ADLT (MISCELLANEOUS) ×2 IMPLANT
EVACUATOR 1/8 PVC DRAIN (DRAIN) ×3 IMPLANT
EVACUATOR SILICONE 100CC (DRAIN) ×3 IMPLANT
GLOVE BIO SURGEON STRL SZ7 (GLOVE) ×7 IMPLANT
GLOVE BIO SURGEON STRL SZ7.5 (GLOVE) ×7 IMPLANT
GLOVE BIOGEL PI IND STRL 7.0 (GLOVE) ×1 IMPLANT
GLOVE BIOGEL PI IND STRL 8 (GLOVE) ×1 IMPLANT
GLOVE BIOGEL PI INDICATOR 7.0 (GLOVE) ×4
GLOVE BIOGEL PI INDICATOR 8 (GLOVE) ×4
HANDPIECE INTERPULSE COAX TIP (DISPOSABLE) ×2
HOOD PEEL AWAY FLYTE STAYCOOL (MISCELLANEOUS) ×3 IMPLANT
IMMOBILIZER KNEE 20 (SOFTGOODS) ×4
IMMOBILIZER KNEE 20 THIGH 36 (SOFTGOODS) IMPLANT
KIT BASIN OR (CUSTOM PROCEDURE TRAY) ×2 IMPLANT
NS IRRIG 1000ML POUR BTL (IV SOLUTION) ×2 IMPLANT
PAD CAST 4YDX4 CTTN HI CHSV (CAST SUPPLIES) ×2 IMPLANT
PADDING CAST COTTON 4X4 STRL (CAST SUPPLIES) ×4
PADDING CAST COTTON 6X4 STRL (CAST SUPPLIES) ×4 IMPLANT
PIN STEINMAN FIXATION KNEE (PIN) ×1 IMPLANT
PIN THREADED HEADED SIGMA (PIN) ×1 IMPLANT
PROTECTOR NERVE ULNAR (MISCELLANEOUS) ×2 IMPLANT
SET HNDPC FAN SPRY TIP SCT (DISPOSABLE) ×1 IMPLANT
SPONGE LAP 18X18 RF (DISPOSABLE) ×2 IMPLANT
STAPLER VISISTAT 35W (STAPLE) IMPLANT
SUCTION FRAZIER HANDLE 12FR (TUBING) ×1
SUCTION TUBE FRAZIER 12FR DISP (TUBING) ×1 IMPLANT
SUT VIC AB 0 CT1 27 (SUTURE) ×6
SUT VIC AB 0 CT1 27XBRD ANTBC (SUTURE) ×3 IMPLANT
SUT VIC AB 1 CT1 27 (SUTURE) ×8
SUT VIC AB 1 CT1 27XBRD ANTBC (SUTURE) ×3 IMPLANT
SUT VIC AB 1 CTX 36 (SUTURE) ×4
SUT VIC AB 1 CTX36XBRD ANBCTR (SUTURE) IMPLANT
SUT VIC AB 2-0 CT1 27 (SUTURE) ×6
SUT VIC AB 2-0 CT1 TAPERPNT 27 (SUTURE) ×3 IMPLANT
SUT VIC AB 3-0 CT1 27 (SUTURE) ×4
SUT VIC AB 3-0 CT1 TAPERPNT 27 (SUTURE) IMPLANT
TIBIA ATTUNE KNEE SYS BASE SZ6 (Knees) ×4 IMPLANT
TOWEL OR 17X26 10 PK STRL BLUE (TOWEL DISPOSABLE) ×2 IMPLANT
TRAY CATH 16FR W/PLASTIC CATH (SET/KITS/TRAYS/PACK) ×2 IMPLANT
WATER STERILE IRR 1000ML POUR (IV SOLUTION) ×3 IMPLANT
YANKAUER SUCT BULB TIP 10FT TU (MISCELLANEOUS) ×3 IMPLANT

## 2018-09-27 NOTE — Anesthesia Preprocedure Evaluation (Addendum)
Anesthesia Evaluation  Patient identified by MRN, date of birth, ID band Patient awake    Reviewed: Allergy & Precautions, NPO status , Patient's Chart, lab work & pertinent test results  History of Anesthesia Complications Negative for: history of anesthetic complications  Airway Mallampati: II  TM Distance: >3 FB Neck ROM: Full    Dental no notable dental hx. (+)    Pulmonary neg pulmonary ROS,    Pulmonary exam normal        Cardiovascular hypertension, Pt. on medications Normal cardiovascular exam     Neuro/Psych negative neurological ROS  negative psych ROS   GI/Hepatic Neg liver ROS, GERD  ,  Endo/Other  negative endocrine ROS  Renal/GU negative Renal ROS  negative genitourinary   Musculoskeletal  (+) Arthritis , Fibromyalgia -  Abdominal (+) + obese,   Peds  Hematology negative hematology ROS (+)   Anesthesia Other Findings   Reproductive/Obstetrics                            Anesthesia Physical Anesthesia Plan  ASA: II  Anesthesia Plan: Spinal   Post-op Pain Management:  Regional for Post-op pain   Induction:   PONV Risk Score and Plan: 2 and Ondansetron, Dexamethasone, Midazolam and Treatment may vary due to age or medical condition  Airway Management Planned: Natural Airway, Nasal Cannula and Simple Face Mask  Additional Equipment: None  Intra-op Plan:   Post-operative Plan:   Informed Consent: I have reviewed the patients History and Physical, chart, labs and discussed the procedure including the risks, benefits and alternatives for the proposed anesthesia with the patient or authorized representative who has indicated his/her understanding and acceptance.     Plan Discussed with:   Anesthesia Plan Comments: (Plan for spinal with 0.5% bupivacaine 15 mg + bilateral adductor canal blocks)      Anesthesia Quick Evaluation

## 2018-09-27 NOTE — Anesthesia Procedure Notes (Signed)
Anesthesia Regional Block: Adductor canal block   Pre-Anesthetic Checklist: ,, timeout performed, Correct Patient, Correct Site, Correct Laterality, Correct Procedure, Correct Position, site marked, Risks and benefits discussed,  Surgical consent,  Pre-op evaluation,  At surgeon's request and post-op pain management  Laterality: Right  Prep: chloraprep       Needles:  Injection technique: Single-shot  Needle Type: Echogenic Stimulator Needle     Needle Length: 9cm  Needle Gauge: 21     Additional Needles:   Procedures:,,,, ultrasound used (permanent image in chart),,,,  Narrative:  Start time: 09/27/2018 12:35 PM End time: 09/27/2018 12:40 PM Injection made incrementally with aspirations every 5 mL.  Performed by: Personally  Anesthesiologist: Lidia Collum, MD  Additional Notes: Monitors applied. Injection made in 5cc increments. No resistance to injection. Good needle visualization. Patient tolerated procedure well.

## 2018-09-27 NOTE — Op Note (Signed)
PATIENT ID:      Dawn Reynolds  MRN:     [id number] DOB/AGE:    08-03-1966 / 52 y.o.       OPERATIVE REPORT    DATE OF PROCEDURE:  09/27/2018       PREOPERATIVE DIAGNOSIS:   BILATERAL KNEE OSTEOARTHRITIS      Estimated body mass index is 38.47 kg/m as calculated from the following:   Height as of this encounter: 5' 6.5" (1.689 m).   Weight as of this encounter: 109.8 kg.                                                        POSTOPERATIVE DIAGNOSIS:   BILATERAL KNEE OSTEOARTHRITIS                                                                      PROCEDURE:  Procedure(s): TOTAL KNEE BILATERAL Using DepuyAttune RP implants #6L, 7R Femur, #6Tibia, 5 mm Attune RP bearing, 38 Patella     SURGEON: Kerin Salen    ASSISTANT:   Kerry Hough. [employer]   (Present and scrubbed throughout the case, critical for assistance with exposure, retraction, instrumentation, and closure.)         ANESTHESIA: Spinal, 20cc Exparel, 50cc 0.25% Marcaine  EBL: 600 cc  FLUID REPLACEMENT: 1600 cc crystaloid  Tourniquet Time: None  Drains: None  Tranexamic Acid: 1gm IV, 2gm topical  Exparel: 266mg   Urine Output 294TM  COMPLICATIONS:  None         INDICATIONS FOR PROCEDURE: The patient has  BILATERAL KNEE OSTEOARTHRITIS, Var deformities, XR shows bone on bone arthritis, lateral subluxation of tibia. Patient has failed all conservative measures including anti-inflammatory medicines, narcotics, attempts at exercise and weight loss, cortisone injections and viscosupplementation.  Risks and benefits of surgery have been discussed, questions answered.   DESCRIPTION OF PROCEDURE: The patient identified by armband, received  IV antibiotics, in the holding area at [facility]. Patient taken to the operating room, appropriate anesthetic monitors were attached, and Spinal anesthesia was  induced. IV Tranexamic acid was given.Tourniquet applied high to the Bilateral thigh. Lateral post and foot positioner applied  to the table, the lower extremities were then prepped and draped in usual sterile fashion from the ankle to the tourniquet. Time-out procedure was performed.  Per patient preference we began the procedure on the right side. The skin and subcutaneous tissue along the incision site was injected with 20 cc of a mixture of Exparel, NS, and Marcaine solution, using a 21-gauge by 1-1/2 inch needle. We began the operation, with the knee flexed 130 degrees, by making the anterior midline incision starting at handbreadth above the patella going over the patella 1 cm medial to and 4 cm distal to the tibial tubercle. Small bleeders in the skin and the subcutaneous tissue identified and cauterized. Transverse retinaculum was incised and reflected medially and a medial parapatellar arthrotomy was accomplished. the patella was everted and theprepatellar fat pad resected. The superficial medial collateral ligament was then elevated from anterior to posterior along the proximal  flare of the tibia and anterior half of the menisci resected. The knee was hyperflexed exposing bone on bone arthritis. Peripheral and notch osteophytes as well as the cruciate ligaments were then resected. We continued to work our way around posteriorly along the proximal tibia, and externally rotated the tibia subluxing it out from underneath the femur. A McHale retractor was placed through the notch and a lateral Hohmann retractor placed, and we then drilled through the proximal tibia in line with the axis of the tibia followed by an intramedullary guide rod and 2-degree posterior slope cutting guide. The tibial cutting guide, 4 degree posterior sloped, was pinned into place allowing resection of 3 mm of bone medially and 10 mm of bone laterally. Satisfied with the tibial resection, we then entered the distal femur 2 mm anterior to the PCL origin with the intramedullary guide rod and applied the distal femoral cutting guide set at 9 mm, with 5 degrees of  valgus. This was pinned along the epicondylar axis. At this point, the distal femoral cut was accomplished without difficulty. We then sized for a #6R femoral component and pinned the guide in 3 degrees of external rotation. The chamfer cutting guide was pinned into place. The anterior, posterior, and chamfer cuts were accomplished without difficulty followed by the Attune RP box cutting guide and the box cut. We also removed posterior osteophytes from the posterior femoral condyles. The posterior capsule was injected with Exparel solution. The knee was brought into full extension. We checked our extension gap and fit a 5 mm bearing. Distracting in extension with a lamina spreader,  bleeders in the posterior capsule, Posterior medial and posterior lateral right down and cauterized.  The transexamic acid-soaked sponge was then placed in the gap of the knee and extension. The knee was flexed 30. The posterior patella cut was accomplished with the 9.5 mm Attune cutting guide, sized for a 39mm dome, and the fixation pegs drilled.The knee was then once again hyperflexed exposing the proximal tibia. We sized for a # 6 tibial base plate, applied the smokestack and the conical reamer followed by the the Delta fin keel punch. We then hammered into place the Attune RP trial femoral component, drilled the lugs, inserted a  5 mm trial bearing, trial patellar button, and took the knee through range of motion from 0-130 degrees. Medial and lateral ligamentous stability was checked. No thumb pressure was required for patellar Tracking. The tourniquet was not used. All trial components were removed, mating surfaces irrigated with pulse lavage, and dried with suction and sponges. 5 cc of the Exparel solution was applied to the cancellus bone of the patella distal femur and proximal tibia.  After waiting 30 seconds, the bony surfaces were again, dried with sponges. A double batch of DePuy HV cement was mixed and applied to all bony  metallic mating surfaces except for the posterior condyles of the femur itself. In order, we hammered into place the tibial tray and removed excess cement, the femoral component and removed excess cement. The final Attune RP bearing was inserted, and the knee brought to full extension with compression. The patellar button was clamped into place, and excess cement removed. The knee was held at 30 flexion with compression, while the cement cured. The wound was irrigated out with normal saline solution pulse lavage. The rest of the Exparel was injected into the parapatellar arthrotomy, subcutaneous tissues, and periosteal tissues. The parapatellar arthrotomy was closed with running #1 Vicryl suture. The subcutaneous tissue with  0 and 2-0 undyed Vicryl suture, and the skin with running 3-0 SQ vicryl. An Aquacil and Ace wrap were applied.  We now directed our attention to the left knee. On the left side, the skin and subcutaneous tissue along the incision site was injected with 20 cc of a mixture of Exparel, NS, and Marcaine solution, using a 21-gauge by 1-1/2 inch needle. We began the operation, with the knee flexed 130 degrees, by making the anterior midline incision starting at handbreadth above the patella going over the patella 1 cm medial to and 4 cm distal to the tibial tubercle. Small bleeders in the skin and the subcutaneous tissue identified and cauterized. Transverse retinaculum was incised and reflected medially and a medial parapatellar arthrotomy was accomplished. the patella was everted and theprepatellar fat pad resected. The superficial medial collateral ligament was then elevated from anterior to posterior along the proximal flare of the tibia and anterior half of the menisci resected. The knee was hyperflexed exposing bone on bone arthritis. Peripheral and notch osteophytes as well as the cruciate ligaments were then resected. We continued to work our way around posteriorly along the proximal tibia,  and externally rotated the tibia subluxing it out from underneath the femur. A McHale retractor was placed through the notch and a lateral Hohmann retractor placed, and we then drilled through the proximal tibia in line with the axis of the tibia followed by an intramedullary guide rod and 2-degree posterior slope cutting guide. The tibial cutting guide, 4 degree posterior sloped, was pinned into place allowing resection of 3 mm of bone medially and 10 mm of bone laterally. Satisfied with the tibial resection, we then entered the distal femur 2 mm anterior to the PCL origin with the intramedullary guide rod and applied the distal femoral cutting guide set at 9 mm, with 5 degrees of valgus. This was pinned along the epicondylar axis. At this point, the distal femoral cut was accomplished without difficulty. We then sized for a #7L femoral component and pinned the guide in 3 degrees of external rotation. The chamfer cutting guide was pinned into place. The anterior, posterior, and chamfer cuts were accomplished without difficulty followed by the Attune RP box cutting guide and the box cut. We also removed posterior osteophytes from the posterior femoral condyles. The posterior capsule was injected with Exparel solution. The knee was brought into full extension. We checked our extension gap and fit a 5 mm bearing. Distracting in extension with a lamina spreader,  bleeders in the posterior capsule, Posterior medial and posterior lateral right down and cauterized.  The transexamic acid-soaked sponge was then placed in the gap of the knee and extension. The knee was flexed 30. The posterior patella cut was accomplished with the 9.5 mm Attune cutting guide, sized for a 60mm dome, and the fixation pegs drilled.The knee was then once again hyperflexed exposing the proximal tibia. We sized for a # 6 tibial base plate, applied the smokestack and the conical reamer followed by the the Delta fin keel punch. We then hammered  into place the Attune RP trial femoral component, drilled the lugs, inserted a  5 mm trial bearing, trial patellar button, and took the knee through range of motion from 0-130 degrees. Medial and lateral ligamentous stability was checked. No thumb pressure was required for patellar Tracking. The tourniquet was not used. All trial components were removed, mating surfaces irrigated with pulse lavage, and dried with suction and sponges. 5 cc of the Exparel solution was  applied to the cancellus bone of the patella distal femur and proximal tibia.  After waiting 30 seconds, the bony surfaces were again, dried with sponges. A double batch of DePuy HV cement was mixed and applied to all bony metallic mating surfaces except for the posterior condyles of the femur itself. In order, we hammered into place the tibial tray and removed excess cement, the femoral component and removed excess cement. The final Attune RP bearing was inserted, and the knee brought to full extension with compression. The patellar button was clamped into place, and excess cement removed. The knee was held at 30 flexion with compression, while the cement cured. The wound was irrigated out with normal saline solution pulse lavage. The rest of the Exparel was injected into the parapatellar arthrotomy, subcutaneous tissues, and periosteal tissues. The parapatellar arthrotomy was closed with running #1 Vicryl suture. The subcutaneous tissue with 0 and 2-0 undyed Vicryl suture, and the skin with running 3-0 SQ vicryl. An Aquacil and Ace wrap were applied.  At this time TED hose were applied to both lower extremities. The patient was taken to recovery room without difficulty.   Kerin Salen 09/27/2018, 5:05 PM

## 2018-09-27 NOTE — Anesthesia Procedure Notes (Signed)
Spinal  Patient location during procedure: OR Staffing Anesthesiologist: Shanan Mcmiller E, MD Performed: anesthesiologist  Preanesthetic Checklist Completed: patient identified, surgical consent, pre-op evaluation, timeout performed, IV checked, risks and benefits discussed and monitors and equipment checked Spinal Block Patient position: sitting Prep: site prepped and draped and DuraPrep Patient monitoring: continuous pulse ox, blood pressure and heart rate Approach: midline Location: L3-4 Injection technique: single-shot Needle Needle type: Pencan  Needle gauge: 24 G Needle length: 9 cm Additional Notes Functioning IV was confirmed and monitors were applied. Sterile prep and drape, including hand hygiene and sterile gloves were used. The patient was positioned and the spine was prepped. The skin was anesthetized with lidocaine.  Free flow of clear CSF was obtained prior to injecting local anesthetic into the CSF. The needle was carefully withdrawn. The patient tolerated the procedure well.      

## 2018-09-27 NOTE — Anesthesia Procedure Notes (Signed)
Anesthesia Regional Block: Adductor canal block   Pre-Anesthetic Checklist: ,, timeout performed, Correct Patient, Correct Site, Correct Laterality, Correct Procedure, Correct Position, site marked, Risks and benefits discussed,  Surgical consent,  Pre-op evaluation,  At surgeon's request and post-op pain management  Laterality: Left  Prep: chloraprep       Needles:  Injection technique: Single-shot  Needle Type: Echogenic Stimulator Needle     Needle Length: 9cm  Needle Gauge: 21     Additional Needles:   Procedures:,,,, ultrasound used (permanent image in chart),,,,  Narrative:  Start time: 09/27/2018 12:40 PM End time: 09/27/2018 12:46 PM Injection made incrementally with aspirations every 5 mL.  Performed by: Personally  Anesthesiologist: Lidia Collum, MD  Additional Notes: Monitors applied. Injection made in 5cc increments. No resistance to injection. Good needle visualization. Patient tolerated procedure well.

## 2018-09-27 NOTE — Anesthesia Procedure Notes (Signed)
Date/Time: 09/27/2018 2:11 PM Performed by: Talbot Grumbling, CRNA Oxygen Delivery Method: Simple face mask

## 2018-09-27 NOTE — Progress Notes (Signed)
Assisted Dr. Christella Hartigan with right, left, ultrasound guided, adductor canal block. Side rails up, monitors on throughout procedure. See vital signs in flow sheet. Tolerated Procedure well.

## 2018-09-27 NOTE — Anesthesia Postprocedure Evaluation (Signed)
Anesthesia Post Note  Patient: Dawn Reynolds  Procedure(s) Performed: TOTAL KNEE BILATERAL (Bilateral Knee)     Patient location during evaluation: PACU Anesthesia Type: Spinal Level of consciousness: oriented and awake and alert Pain management: pain level controlled Vital Signs Assessment: post-procedure vital signs reviewed and stable Respiratory status: spontaneous breathing, respiratory function stable and patient connected to nasal cannula oxygen Cardiovascular status: blood pressure returned to baseline and stable Postop Assessment: no headache, no backache, no apparent nausea or vomiting, spinal receding and patient able to bend at knees Anesthetic complications: no    Last Vitals:  Vitals:   09/27/18 1845 09/27/18 1900  BP: 121/72 120/75  Pulse: 68 65  Resp: 18 15  Temp:    SpO2: 99% 98%    Last Pain:  Vitals:   09/27/18 1900  TempSrc:   PainSc: 2                  Effie Berkshire

## 2018-09-27 NOTE — Interval H&P Note (Signed)
History and Physical Interval Note:  09/27/2018 1:53 PM  Dawn Reynolds Comment  has presented today for surgery, with the diagnosis of BILATERAL KNEE OSTEOARTHRITIS  The various methods of treatment have been discussed with the patient and family. After consideration of risks, benefits and other options for treatment, the patient has consented to  Procedure(s): TOTAL KNEE BILATERAL (Bilateral) as a surgical intervention .  The patient's history has been reviewed, patient examined, no change in status, stable for surgery.  I have reviewed the patient's chart and labs.  Questions were answered to the patient's satisfaction.     Kerin Salen

## 2018-09-27 NOTE — Discharge Instructions (Signed)

## 2018-09-27 NOTE — Transfer of Care (Signed)
Immediate Anesthesia Transfer of Care Note  Patient: Dawn Reynolds  Procedure(s) Performed: TOTAL KNEE BILATERAL (Bilateral Knee)  Patient Location: PACU  Anesthesia Type:Spinal  Level of Consciousness: awake and alert   Airway & Oxygen Therapy: Patient Spontanous Breathing and Patient connected to face mask oxygen  Post-op Assessment: Report given to RN and Post -op Vital signs reviewed and stable  Post vital signs: Reviewed and stable  Last Vitals:  Vitals Value Taken Time  BP 110/58 09/27/2018  5:35 PM  Temp 36.4 C 09/27/2018  5:34 PM  Pulse 66 09/27/2018  5:44 PM  Resp 15 09/27/2018  5:44 PM  SpO2 100 % 09/27/2018  5:44 PM  Vitals shown include unvalidated device data.  Last Pain:  Vitals:   09/27/18 1734  TempSrc:   PainSc: 0-No pain      Patients Stated Pain Goal: 4 ([id number])  Complications: No apparent anesthesia complications

## 2018-09-28 ENCOUNTER — Encounter (HOSPITAL_COMMUNITY): Payer: Self-pay | Admitting: Orthopedic Surgery

## 2018-09-28 LAB — CBC
HEMATOCRIT: 36.6 % (ref 36.0–46.0)
HEMOGLOBIN: 11.6 g/dL — AB (ref 12.0–15.0)
MCH: 29 pg (ref 26.0–34.0)
MCHC: 31.7 g/dL (ref 30.0–36.0)
MCV: 91.5 fL (ref 80.0–100.0)
NRBC: 0 % (ref 0.0–0.2)
Platelets: 230 10*3/uL (ref 150–400)
RBC: 4 MIL/uL (ref 3.87–5.11)
RDW: 14.6 % (ref 11.5–15.5)
WBC: 16.3 10*3/uL — AB (ref 4.0–10.5)

## 2018-09-28 LAB — BASIC METABOLIC PANEL
ANION GAP: 10 (ref 5–15)
BUN: 20 mg/dL (ref 6–20)
CO2: 24 mmol/L (ref 22–32)
Calcium: 8.5 mg/dL — ABNORMAL LOW (ref 8.9–10.3)
Chloride: 105 mmol/L (ref 98–111)
Creatinine, Ser: 0.7 mg/dL (ref 0.44–1.00)
Glucose, Bld: 183 mg/dL — ABNORMAL HIGH (ref 70–99)
Potassium: 4.2 mmol/L (ref 3.5–5.1)
Sodium: 139 mmol/L (ref 135–145)

## 2018-09-28 MED FILL — GABAPENTIN 300 MG CAPSULE: 300 | 30 days supply | Qty: 90 | Fill #0

## 2018-09-28 MED FILL — OXYCODONE-ACETAMINOPHEN 5-3: 5-325 | 5 days supply | Qty: 30 | Fill #0

## 2018-09-28 MED FILL — ASPIRIN ADULT LOW STRENGTH: 81 | 30 days supply | Qty: 60 | Fill #0

## 2018-09-28 MED FILL — CELECOXIB 200 MG CAP: 200 | 30 days supply | Qty: 60 | Fill #0

## 2018-09-28 MED FILL — METHOCARBAMOL 500 MG TABS: 500 | 30 days supply | Qty: 60 | Fill #0

## 2018-09-28 NOTE — Discharge Summary (Signed)
Patient ID: Dawn Reynolds MRN: [id number] DOB/AGE: 05/19/1966 52 y.o.  Admit date: 09/27/2018 Discharge date: 09/28/2018  Admission Diagnoses:  Principal Problem:   Bilateral primary osteoarthritis of knee Active Problems:   S/P TKR (total knee replacement), bilateral   Discharge Diagnoses:  Same  Past Medical History:  Diagnosis Date  . Anemia    hx of  . Arthritis   . 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 Chicken pox   . Depression    hx of in past situational  . Fibromyalgia   . GERD (gastroesophageal reflux disease)   . Headache    hx of migraines  . History of kidney stones    1996  . Hypertension     Surgeries: Procedure(s): TOTAL KNEE BILATERAL on 09/27/2018   Consultants:   Discharged Condition: Improved  Hospital Course: Dawn Reynolds is an 52 y.o. female who was admitted 09/27/2018 for operative treatment ofBilateral primary osteoarthritis of knee. Patient has severe unremitting pain that affects sleep, daily activities, and work/hobbies. After pre-op clearance the patient was taken to the operating room on 09/27/2018 and underwent  Procedure(s): TOTAL KNEE BILATERAL.    Patient was given perioperative antibiotics:  Anti-infectives (From admission, onward)   Start     Dose/Rate Route Frequency Ordered Stop   09/27/18 1145  ceFAZolin (ANCEF) IVPB 2g/100 mL premix     2 g 200 mL/hr over 30 Minutes Intravenous On call to O.R. 09/27/18 1136 09/27/18 1415       Patient was given sequential compression devices, early ambulation, and chemoprophylaxis to prevent DVT.  Patient benefited maximally from hospital stay and there were no complications.    Recent vital signs:  Patient Vitals for the past 24 hrs:  BP Temp Temp src Pulse Resp SpO2  09/28/18 0932 117/75 97.6 F (36.4 C) Oral 82 16 97 %  09/28/18 0547 111/67 97.6 F (36.4 C) Oral 89 17 95 %  09/28/18 0151 117/67 98.4 F (36.9 C) Oral 79 18 96 %  09/27/18 2207 109/62 97.8 F (36.6 C) Oral 79  20 96 %  09/27/18 2200 132/64 97.8 F (36.6 C) - 80 16 95 %  09/27/18 2100 (!) 129/54 - - 82 20 98 %  09/27/18 2000 123/71 - - 84 16 96 %  09/27/18 1900 120/75 - - 65 15 98 %  09/27/18 1845 121/72 - - 68 18 99 %  09/27/18 1830 104/65 97.7 F (36.5 C) - 63 20 100 %  09/27/18 1815 117/66 - - 63 18 100 %  09/27/18 1800 104/67 - - 63 14 100 %  09/27/18 1745 (!) 109/92 - - 65 13 100 %  09/27/18 1734 (!) 110/58 97.6 F (36.4 C) - 66 14 96 %  09/27/18 1314 - - - 65 14 (!) 80 %  09/27/18 1313 - - - 66 15 96 %  09/27/18 1312 - - - 62 15 100 %  09/27/18 1311 - - - 70 17 96 %  09/27/18 1310 140/89 - - 64 15 96 %  09/27/18 1309 - - - 65 (!) 21 100 %  09/27/18 1308 - - - 63 13 97 %  09/27/18 1307 - - - 63 (!) 22 100 %  09/27/18 1306 - - - 66 17 93 %  09/27/18 1305 (!) 149/90 - - 65 (!) 21 100 %  09/27/18 1304 - - - 65 (!) 22 100 %  09/27/18 1303 - - - 65 20 100 %  09/27/18 1302 - - -  67 20 98 %  09/27/18 1301 - - - 66 (!) 25 96 %  09/27/18 1300 (!) 147/92 - - 66 (!) 22 95 %  09/27/18 1259 - - - 65 18 96 %  09/27/18 1258 - - - 64 (!) 31 96 %  09/27/18 1257 - - - 65 19 97 %     Recent laboratory studies:  Recent Labs    09/26/18 1405 09/28/18 0512  WBC 6.5 16.3*  HGB 14.6 11.6*  HCT 45.9 36.6  PLT 274 230  NA 143 139  K 4.5 4.2  CL 106 105  CO2 30 24  BUN 19 20  CREATININE 1.11* 0.70  GLUCOSE 86 183*  INR 0.89  --   CALCIUM 9.1 8.5*     Discharge Medications:   Allergies as of 09/28/2018      Reactions   Aspartame Other (See Comments)   Headaches.   Demerol [meperidine] Nausea And Vomiting      Medication List    STOP taking these medications   cyclobenzaprine 10 MG tablet Commonly known as:  FLEXERIL   ibuprofen 200 MG tablet Commonly known as:  ADVIL,MOTRIN   meloxicam 15 MG tablet Commonly known as:  MOBIC     TAKE these medications   amLODipine 10 MG tablet Commonly known as:  NORVASC Take 1 tablet (10 mg total) by mouth daily.   aspirin EC 81 MG  tablet Take 1 tablet (81 mg total) by mouth 2 (two) times daily.   atorvastatin 20 MG tablet Commonly known as:  LIPITOR Take 1 tablet (20 mg total) by mouth daily.   B COMPLEX 1 PO Take 1 tablet by mouth daily.   Biotin 5000 MCG Caps Take 5,000 mcg by mouth daily.   CALCIUM 600+D 600-800 MG-UNIT Tabs Generic drug:  Calcium Carb-Cholecalciferol Take 1 tablet by mouth daily.   celecoxib 200 MG capsule Commonly known as:  CELEBREX Take 1 capsule (200 mg total) by mouth 2 (two) times daily.   cholecalciferol 25 MCG (1000 UT) tablet Commonly known as:  VITAMIN D3 Take 1,000 Units by mouth daily.   DULoxetine 60 MG capsule Commonly known as:  CYMBALTA Take 1 capsule (60 mg total) by mouth 2 (two) times daily.   FLAX SEED OIL PO Take 1,400 mg by mouth daily.   gabapentin 300 MG capsule Commonly known as:  NEURONTIN Take 1 capsule (300 mg total) by mouth 3 (three) times daily.   GARLIQUE PO Take 1 tablet by mouth daily.   hydrochlorothiazide 12.5 MG capsule Commonly known as:  MICROZIDE Take 1 capsule (12.5 mg total) by mouth daily.   methocarbamol 500 MG tablet Commonly known as:  ROBAXIN Take 1 tablet (500 mg total) by mouth 2 (two) times daily with a meal. What changed:    when to take this  reasons to take this   nitrofurantoin (macrocrystal-monohydrate) 100 MG capsule Commonly known as:  MACROBID Take 1 capsule daily as needed following intercourse to prevent UTI What changed:    how much to take  how to take this  when to take this  additional instructions   Omega-3 1000 MG Caps Take 1,000 mg by mouth daily.   omeprazole 20 MG capsule Commonly known as:  PRILOSEC Take 1 capsule (20 mg total) by mouth daily.   oxyCODONE-acetaminophen 5-325 MG tablet Commonly known as:  PERCOCET/ROXICET Take 1 tablet by mouth every 4 (four) hours as needed for severe pain. What changed:  when to take this  TURMERIC PO Take 1 capsule by mouth daily.    vitamin C 500 MG tablet Commonly known as:  ASCORBIC ACID Take 500 mg by mouth daily.            Durable Medical Equipment  (From admission, onward)         Start     Ordered   09/27/18 2220  DME Walker rolling  Once    Question:  Patient needs a walker to treat with the following condition  Answer:  Status post total left knee replacement   09/27/18 2219   09/27/18 2220  DME 3 n 1  Once     09/27/18 2219           Discharge Care Instructions  (From admission, onward)         Start     Ordered   09/28/18 0000  Weight bearing as tolerated     09/28/18 1256          Diagnostic Studies: Dg Chest 2 View  Result Date: 09/26/2018 CLINICAL DATA:  Pre-op respiratory exam EXAM: CHEST - 2 VIEW COMPARISON:  None. FINDINGS: The heart size and mediastinal contours are within normal limits. Mild scarring noted in the lingula. No evidence of pulmonary infiltrate or edema. No evidence of pleural effusion. The visualized skeletal structures are unremarkable. IMPRESSION: No active cardiopulmonary disease. Electronically Signed   By: Earle Gell M.D.   On: 09/26/2018 21:57    Disposition: Discharge disposition: 01-Home or Self Care       Discharge Instructions    Call MD / Call 911   Complete by:  As directed    If you experience chest pain or shortness of breath, CALL 911 and be transported to the hospital emergency room.  If you develope a fever above 101 F, pus (white drainage) or increased drainage or redness at the wound, or calf pain, call your surgeon's office.   Constipation Prevention   Complete by:  As directed    Drink plenty of fluids.  Prune juice may be helpful.  You may use a stool softener, such as Colace (over the counter) 100 mg twice a day.  Use MiraLax (over the counter) for constipation as needed.   Diet - low sodium heart healthy   Complete by:  As directed    Driving restrictions   Complete by:  As directed    No driving for 2 weeks   Increase  activity slowly as tolerated   Complete by:  As directed    Patient may shower   Complete by:  As directed    You may shower without a dressing once there is no drainage.  Do not wash over the wound.  If drainage remains, cover wound with plastic wrap and then shower.   Weight bearing as tolerated   Complete by:  As directed       Follow-up Information    Frederik Pear, MD In 2 weeks.   Specialty:  Orthopedic Surgery Contact information: [place] [address] [phone]            Signed: Joanell Rising 09/28/2018, 12:56 PM

## 2018-09-28 NOTE — Progress Notes (Signed)
Physical Therapy Treatment Patient Details Name: Dawn Reynolds MRN: [id number] DOB: 02/14/1966 Today's Date: 09/28/2018    History of Present Illness Pt s/p Bil TKR and with hx of Fibromyalgia    PT Comments    Pt motivated to dc home this date but concerned about getting up into high bed.  Pt states lowering bed is not a possibility.  Pt able to negotiate single step bkwds to use stool to get into bed.   Follow Up Recommendations  Home health PT     Equipment Recommendations  3in1 (PT);Rolling walker with 5" wheels    Recommendations for Other Services       Precautions / Restrictions Precautions Precautions: Fall Restrictions Weight Bearing Restrictions: No Other Position/Activity Restrictions: WBAT    Mobility  Bed Mobility Overal bed mobility: Modified Independent                Transfers Overall transfer level: Needs assistance Equipment used: Rolling walker (2 wheeled) Transfers: Sit to/from Stand Sit to Stand: Min guard;Supervision         General transfer comment: cues for use of UEs and to walk feet fwd/bkwd to for chair transfers  Ambulation/Gait Ambulation/Gait assistance: Min guard Gait Distance (Feet): 75 Feet Assistive device: Rolling walker (2 wheeled) Gait Pattern/deviations: Step-to pattern Gait velocity: decr   General Gait Details: cues for posture and position from RW;    Stairs Stairs: Yes Stairs assistance: Min assist;+2 safety/equipment Stair Management: No rails;Step to pattern;Backwards;With walker Number of Stairs: 1 General stair comments: To use stool to get up into high bed.  Cues for sequence   Wheelchair Mobility    Modified Rankin (Stroke Patients Only)       Balance Overall balance assessment: Mild deficits observed, not formally tested                                          Cognition Arousal/Alertness: Awake/alert Behavior During Therapy: WFL for tasks assessed/performed Overall  Cognitive Status: Within Functional Limits for tasks assessed                                        Exercises      General Comments        Pertinent Vitals/Pain Pain Assessment: 0-10 Pain Score: 6  Pain Location: Bil knees Pain Descriptors / Indicators: Aching;Sore Pain Intervention(s): Limited activity within patient's tolerance;Monitored during session;Premedicated before session;Ice applied    Home Living                      Prior Function            PT Goals (current goals can now be found in the care plan section) Acute Rehab PT Goals Patient Stated Goal: Regain IND PT Goal Formulation: With patient Time For Goal Achievement: 10/05/18 Potential to Achieve Goals: Good Progress towards PT goals: Progressing toward goals    Frequency    7X/week      PT Plan Current plan remains appropriate    Co-evaluation              AM-PAC PT "6 Clicks" Daily Activity  Outcome Measure  Difficulty turning over in bed (including adjusting bedclothes, sheets and blankets)?: A Lot Difficulty moving from lying on back to sitting on the  side of the bed? : A Lot Difficulty sitting down on and standing up from a chair with arms (e.g., wheelchair, bedside commode, etc,.)?: A Little Help needed moving to and from a bed to chair (including a wheelchair)?: A Little Help needed walking in hospital room?: A Little Help needed climbing 3-5 steps with a railing? : A Little 6 Click Score: 16    End of Session Equipment Utilized During Treatment: Gait belt Activity Tolerance: Patient tolerated treatment well Patient left: in bed;with call bell/phone within reach;with family/visitor present Nurse Communication: Mobility status PT Visit Diagnosis: Difficulty in walking, not elsewhere classified (R26.2)     Time: 6734-1937 PT Time Calculation (min) (ACUTE ONLY): 15 min  Charges:  $Gait Training: 8-22 mins                     [place] Pager [phone] Office [phone]    Kirstina Leinweber 09/28/2018, 3:13 PM

## 2018-09-28 NOTE — Addendum Note (Signed)
Addendum  created 09/28/18 0556 by Lollie Sails, CRNA   Charge Capture section accepted

## 2018-09-28 NOTE — Care Management Note (Signed)
Case Management Note  Patient Details  Name: Dawn Reynolds MRN: [id number] Date of Birth: 05-Jul-1966  Subjective/Objective:      Discharge planning, spoke with patient and spouse at beside. Chose [facility] for [facility] services, PT to eval and treat.   Action/Plan: Contacted [facility] for referral. Needs a RW and 3-n-1, contacted [facility] to deliver to the room. [phone]               Expected Discharge Date:  09/28/18               Expected Discharge Plan:  [place]  In-House Referral:  NA  Discharge planning Services  CM Consult  Post Acute Care Choice:  Home Health, Durable Medical Equipment Choice offered to:  Patient, Spouse  DME Arranged:  3-N-1, Walker rolling DME Agency:  [place]:  PT [place]:  [place]  Status of Service:  Completed, signed off  If discussed at [place] of Stay Meetings, dates discussed:    Additional Comments:  Guadalupe Maple, RN 09/28/2018, 1:00 PM

## 2018-09-28 NOTE — Progress Notes (Signed)
PATIENT ID: Chablis Losh  MRN: [id number]  DOB/AGE:  Oct 09, 1966 / 52 y.o.  1 Day Post-Op Procedure(s) (LRB): TOTAL KNEE BILATERAL (Bilateral)    PROGRESS NOTE Subjective: Patient is alert, oriented, no Nausea, no Vomiting, yes passing gas. Taking PO well. Denies SOB, Chest or Calf Pain. Using Incentive Spirometer, PAS in place. Ambulate 30', Patient reports pain as 2/10 .    Objective: Vital signs in last 24 hours: Vitals:   09/27/18 2207 09/28/18 0151 09/28/18 0547 09/28/18 0932  BP: 109/62 117/67 111/67 117/75  Pulse: 79 79 89 82  Resp: 20 18 17 16   Temp: 97.8 F (36.6 C) 98.4 F (36.9 C) 97.6 F (36.4 C) 97.6 F (36.4 C)  TempSrc: Oral Oral Oral Oral  SpO2: 96% 96% 95% 97%  Weight:      Height:          Intake/Output from previous day: I/O last 3 completed shifts: In: 3005 [P.O.:930; I.V.:2812] Out: 1350 [Urine:950; Blood:400]   Intake/Output this shift: Total I/O In: 240 [P.O.:240] Out: -    LABORATORY DATA: Recent Labs    09/26/18 1405 09/28/18 0512  WBC 6.5 16.3*  HGB 14.6 11.6*  HCT 45.9 36.6  PLT 274 230  NA 143 139  K 4.5 4.2  CL 106 105  CO2 30 24  BUN 19 20  CREATININE 1.11* 0.70  GLUCOSE 86 183*  INR 0.89  --   CALCIUM 9.1 8.5*    Examination: Neurologically intact ABD soft Neurovascular intact Sensation intact distally Intact pulses distally Dorsiflexion/Plantar flexion intact Incision: dressing C/D/I No cellulitis present Compartment soft}  Assessment:   1 Day Post-Op Procedure(s) (LRB): TOTAL KNEE BILATERAL (Bilateral) ADDITIONAL DIAGNOSIS: Expected Acute Blood Loss Anemia,    Plan: PT/OT WBAT, AROM and PROM  DVT Prophylaxis:  SCDx72hrs, ASA 81 mg BID x 2 weeks DISCHARGE PLAN: Home, today if passes PY DISCHARGE NEEDS: HHPT, Walker and 3-in-1 comode seat     Kerin Salen 09/28/2018, 9:48 AM Patient ID: Fabienne Bruns, female   DOB: 09-20-1966, 52 y.o.   MRN: [id number]

## 2018-09-28 NOTE — Plan of Care (Signed)
Patient to discharge home. Discharge instructions given to patient and husband, both verbalized understanding. Rx given

## 2018-09-28 NOTE — Evaluation (Signed)
Physical Therapy Evaluation Patient Details Name: Dawn Reynolds MRN: [id number] DOB: [birth year] Today's Date: 09/28/2018   History of Present Illness  Pt s/p Bil TKR and with hx of Fibromyalgia  Clinical Impression  Pt s/p bil TKR and presents with decreased ROM/Strength Bil LEs and post op pain limiting functional mobility.  Pt should progress to dc home with family assist and HHPT follow up.    Follow Up Recommendations Home health PT    Equipment Recommendations  3in1 (PT);Rolling walker with 5" wheels    Recommendations for Other Services       Precautions / Restrictions Precautions Precautions: Fall Restrictions Weight Bearing Restrictions: No Other Position/Activity Restrictions: WBAT      Mobility  Bed Mobility Overal bed mobility: Modified Independent             General bed mobility comments: Pt up to EOB sitting unassisted while PT out of room  Transfers Overall transfer level: Needs assistance Equipment used: Rolling walker (2 wheeled) Transfers: Sit to/from Stand Sit to Stand: Min guard         General transfer comment: cues for use of UEs and to walk feet fwd/bkwd to for chair transfers  Ambulation/Gait Ambulation/Gait assistance: Min assist;Min guard Gait Distance (Feet): 111 Feet Assistive device: Rolling walker (2 wheeled) Gait Pattern/deviations: Step-to pattern Gait velocity: decr   General Gait Details: cues for posture and position from RW; several episodes mild buckling noted at knees but pt able to recover unassisted  Stairs            Wheelchair Mobility    Modified Rankin (Stroke Patients Only)       Balance Overall balance assessment: Mild deficits observed, not formally tested                                           Pertinent Vitals/Pain Pain Assessment: 0-10 Pain Score: 6 (5/10 L knee; 7/10 R knee) Pain Location: Bil knees Pain Descriptors / Indicators: Aching;Sore Pain Intervention(s):  Limited activity within patient's tolerance;Monitored during session;Premedicated before session;Patient requesting pain meds-RN notified;Ice applied    Home Living Family/patient expects to be discharged to:: Private residence Living Arrangements: Spouse/significant other Available Help at Discharge: Family Type of Home: House Home Access: Ramped entrance     [place]: [place]: None      Prior Function Level of Independence: Independent         Comments: Works as Therapist, sports at [employer]        Extremity/Trunk Assessment   Upper Extremity Assessment Upper Extremity Assessment: Overall WFL for tasks assessed    Lower Extremity Assessment Lower Extremity Assessment: RLE deficits/detail;LLE deficits/detail RLE Deficits / Details: 3-/5 quads with AAROM at knee -10- 75 LLE Deficits / Details: 3-/5 quads with AAROM at knee -10 - 70    Cervical / Trunk Assessment Cervical / Trunk Assessment: Normal  Communication   Communication: No difficulties  Cognition Arousal/Alertness: Awake/alert Behavior During Therapy: WFL for tasks assessed/performed Overall Cognitive Status: Within Functional Limits for tasks assessed                                        General Comments      Exercises Total Joint Exercises Ankle Circles/Pumps: AROM;Both;15 reps;Supine  Quad Sets: AROM;Both;10 reps;Supine Heel Slides: AAROM;Both;10 reps;Supine Straight Leg Raises: AAROM;Both;10 reps;Supine   Assessment/Plan    PT Assessment Patient needs continued PT services  PT Problem List Decreased strength;Decreased range of motion;Decreased activity tolerance;Decreased mobility;Decreased knowledge of use of DME;Obesity;Pain       PT Treatment Interventions DME instruction;Gait training;Stair training;Functional mobility training;Therapeutic activities;Therapeutic exercise;Patient/family education    PT Goals (Current goals can be found in  the Care Plan section)  Acute Rehab PT Goals Patient Stated Goal: Regain IND PT Goal Formulation: With patient Time For Goal Achievement: 10/05/18 Potential to Achieve Goals: Good    Frequency 7X/week   Barriers to discharge        Co-evaluation               AM-PAC PT "6 Clicks" Daily Activity  Outcome Measure Difficulty turning over in bed (including adjusting bedclothes, sheets and blankets)?: A Lot Difficulty moving from lying on back to sitting on the side of the bed? : A Lot Difficulty sitting down on and standing up from a chair with arms (e.g., wheelchair, bedside commode, etc,.)?: A Little Help needed moving to and from a bed to chair (including a wheelchair)?: A Little Help needed walking in hospital room?: A Little Help needed climbing 3-5 steps with a railing? : A Lot 6 Click Score: 15    End of Session Equipment Utilized During Treatment: Gait belt Activity Tolerance: Patient tolerated treatment well Patient left: in chair;with call bell/phone within reach;with family/visitor present Nurse Communication: Mobility status PT Visit Diagnosis: Difficulty in walking, not elsewhere classified (R26.2)    Time: 4097-3532 PT Time Calculation (min) (ACUTE ONLY): 42 min   Charges:   PT Evaluation $PT Eval Low Complexity: 1 Low PT Treatments $Gait Training: 8-22 mins $Therapeutic Exercise: 8-22 mins        [place] Pager [phone] Office [phone]   Skiler Tye 09/28/2018, 11:03 AM

## 2018-09-29 ENCOUNTER — Other Ambulatory Visit: Payer: Self-pay

## 2018-09-29 DIAGNOSIS — Z6838 Body mass index (BMI) 38.0-38.9, adult: Secondary | ICD-10-CM | POA: Diagnosis not present

## 2018-09-29 DIAGNOSIS — M797 Fibromyalgia: Secondary | ICD-10-CM | POA: Diagnosis not present

## 2018-09-29 DIAGNOSIS — Z96653 Presence of artificial knee joint, bilateral: Secondary | ICD-10-CM | POA: Diagnosis not present

## 2018-09-29 DIAGNOSIS — R079 Chest pain, unspecified: Secondary | ICD-10-CM | POA: Diagnosis not present

## 2018-09-29 DIAGNOSIS — I10 Essential (primary) hypertension: Secondary | ICD-10-CM | POA: Diagnosis not present

## 2018-09-29 DIAGNOSIS — E781 Pure hyperglyceridemia: Secondary | ICD-10-CM | POA: Diagnosis not present

## 2018-09-29 DIAGNOSIS — Z471 Aftercare following joint replacement surgery: Secondary | ICD-10-CM | POA: Diagnosis not present

## 2018-09-29 DIAGNOSIS — M1990 Unspecified osteoarthritis, unspecified site: Secondary | ICD-10-CM | POA: Diagnosis not present

## 2018-09-29 DIAGNOSIS — E669 Obesity, unspecified: Secondary | ICD-10-CM | POA: Diagnosis not present

## 2018-09-29 NOTE — Patient Outreach (Signed)
[facility]) Care Management  09/29/2018  Tahirih Lair Jan 13, 1966 [id number]   Telephone call for transition of care call.  Member was hospitalized for bilateral total knee replacements on 09/27/18 and discharged on 09/28/18  Subjective: States she is having pain when she gets in and out of her bed.  States that [facility] is on their way to visit her.  States she has her 3:1 and walker that was ordered.  States she has not called yet to make her follow up appt with her surgeon.  States her husband helps her as needed.  States she has not called to start her Family Medical Leave started but she has the numbers to contact Human Resources for her short term leave and hospital indemnity    Objective:   Bilateral primary osteoarthritis of knee Active Problems:   S/P TKR (total knee replacement), bilateral  Assessment: Transition of care completed Reviewed s/s to call MD Reviewed [facility] benefits and to call Benefits line as needed Instructed to call her provider to make follow up appt  Plan: Plan to close case as member assessed with no further needs identified. Plan to send successful Outreach letter.  Peter Garter RN, Jackquline Denmark, CDE Care Management Coordinator [facility] Care Management [phone]

## 2018-10-01 LAB — TYPE AND SCREEN
ABO/RH(D): B POS
Antibody Screen: NEGATIVE
Unit division: 0
Unit division: 0

## 2018-10-01 LAB — BPAM RBC
Blood Product Expiration Date: 201911222359
Blood Product Expiration Date: 201912062359
UNIT TYPE AND RH: 7300
Unit Type and Rh: 1700

## 2018-10-02 DIAGNOSIS — Z6838 Body mass index (BMI) 38.0-38.9, adult: Secondary | ICD-10-CM | POA: Diagnosis not present

## 2018-10-02 DIAGNOSIS — M797 Fibromyalgia: Secondary | ICD-10-CM | POA: Diagnosis not present

## 2018-10-02 DIAGNOSIS — Z96653 Presence of artificial knee joint, bilateral: Secondary | ICD-10-CM | POA: Diagnosis not present

## 2018-10-02 DIAGNOSIS — E669 Obesity, unspecified: Secondary | ICD-10-CM | POA: Diagnosis not present

## 2018-10-02 DIAGNOSIS — M1990 Unspecified osteoarthritis, unspecified site: Secondary | ICD-10-CM | POA: Diagnosis not present

## 2018-10-02 DIAGNOSIS — Z471 Aftercare following joint replacement surgery: Secondary | ICD-10-CM | POA: Diagnosis not present

## 2018-10-02 DIAGNOSIS — R079 Chest pain, unspecified: Secondary | ICD-10-CM | POA: Diagnosis not present

## 2018-10-02 DIAGNOSIS — I10 Essential (primary) hypertension: Secondary | ICD-10-CM | POA: Diagnosis not present

## 2018-10-02 DIAGNOSIS — E781 Pure hyperglyceridemia: Secondary | ICD-10-CM | POA: Diagnosis not present

## 2018-10-03 MED FILL — CYCLOBENZAPRINE 10 MG TAB: 10 | 10 days supply | Qty: 30 | Fill #0

## 2018-10-03 MED FILL — traMADol HCL 50 MG TABS: 50 | 8 days supply | Qty: 30 | Fill #0

## 2018-10-04 DIAGNOSIS — E781 Pure hyperglyceridemia: Secondary | ICD-10-CM | POA: Diagnosis not present

## 2018-10-04 DIAGNOSIS — I10 Essential (primary) hypertension: Secondary | ICD-10-CM | POA: Diagnosis not present

## 2018-10-04 DIAGNOSIS — E669 Obesity, unspecified: Secondary | ICD-10-CM | POA: Diagnosis not present

## 2018-10-04 DIAGNOSIS — Z96653 Presence of artificial knee joint, bilateral: Secondary | ICD-10-CM | POA: Diagnosis not present

## 2018-10-04 DIAGNOSIS — M797 Fibromyalgia: Secondary | ICD-10-CM | POA: Diagnosis not present

## 2018-10-04 DIAGNOSIS — R079 Chest pain, unspecified: Secondary | ICD-10-CM | POA: Diagnosis not present

## 2018-10-04 DIAGNOSIS — Z471 Aftercare following joint replacement surgery: Secondary | ICD-10-CM | POA: Diagnosis not present

## 2018-10-04 DIAGNOSIS — Z6838 Body mass index (BMI) 38.0-38.9, adult: Secondary | ICD-10-CM | POA: Diagnosis not present

## 2018-10-04 DIAGNOSIS — M1990 Unspecified osteoarthritis, unspecified site: Secondary | ICD-10-CM | POA: Diagnosis not present

## 2018-10-06 DIAGNOSIS — E781 Pure hyperglyceridemia: Secondary | ICD-10-CM | POA: Diagnosis not present

## 2018-10-06 DIAGNOSIS — I10 Essential (primary) hypertension: Secondary | ICD-10-CM | POA: Diagnosis not present

## 2018-10-06 DIAGNOSIS — M797 Fibromyalgia: Secondary | ICD-10-CM | POA: Diagnosis not present

## 2018-10-06 DIAGNOSIS — E669 Obesity, unspecified: Secondary | ICD-10-CM | POA: Diagnosis not present

## 2018-10-06 DIAGNOSIS — Z6838 Body mass index (BMI) 38.0-38.9, adult: Secondary | ICD-10-CM | POA: Diagnosis not present

## 2018-10-06 DIAGNOSIS — M1990 Unspecified osteoarthritis, unspecified site: Secondary | ICD-10-CM | POA: Diagnosis not present

## 2018-10-06 DIAGNOSIS — R079 Chest pain, unspecified: Secondary | ICD-10-CM | POA: Diagnosis not present

## 2018-10-06 DIAGNOSIS — Z471 Aftercare following joint replacement surgery: Secondary | ICD-10-CM | POA: Diagnosis not present

## 2018-10-06 DIAGNOSIS — Z96653 Presence of artificial knee joint, bilateral: Secondary | ICD-10-CM | POA: Diagnosis not present

## 2018-10-10 DIAGNOSIS — M170 Bilateral primary osteoarthritis of knee: Secondary | ICD-10-CM | POA: Diagnosis not present

## 2018-10-10 MED FILL — traMADol HCL 50 MG TABS: 50 | 15 days supply | Qty: 60 | Fill #0

## 2018-10-11 ENCOUNTER — Other Ambulatory Visit: Payer: Self-pay

## 2018-10-11 ENCOUNTER — Ambulatory Visit: Payer: 59 | Attending: Orthopedic Surgery | Admitting: Physical Therapy

## 2018-10-11 ENCOUNTER — Encounter: Payer: Self-pay | Admitting: Physical Therapy

## 2018-10-11 DIAGNOSIS — M25562 Pain in left knee: Secondary | ICD-10-CM | POA: Diagnosis not present

## 2018-10-11 DIAGNOSIS — R262 Difficulty in walking, not elsewhere classified: Secondary | ICD-10-CM | POA: Insufficient documentation

## 2018-10-11 DIAGNOSIS — M25661 Stiffness of right knee, not elsewhere classified: Secondary | ICD-10-CM | POA: Diagnosis not present

## 2018-10-11 DIAGNOSIS — M25561 Pain in right knee: Secondary | ICD-10-CM | POA: Diagnosis not present

## 2018-10-11 DIAGNOSIS — M6281 Muscle weakness (generalized): Secondary | ICD-10-CM | POA: Insufficient documentation

## 2018-10-11 DIAGNOSIS — M25662 Stiffness of left knee, not elsewhere classified: Secondary | ICD-10-CM | POA: Insufficient documentation

## 2018-10-11 NOTE — Therapy (Signed)
[place], [place], [address] Phone: [phone]   Fax:  [phone]  Physical Therapy Evaluation  Patient Details  Name: Dawn Reynolds MRN: [id number] Date of Birth: 11/15/1965 Referring Provider (PT): Frederik Pear, MD   Encounter Date: 10/11/2018  PT End of Session - 10/11/18 1226    Visit Number  1    Number of Visits  12    Date for PT Re-Evaluation  11/08/18    Authorization Type  [facility] UMR-no visit limit    PT Start Time  1012    PT Stop Time  1104    PT Time Calculation (min)  52 min    Activity Tolerance  Patient tolerated treatment well    Behavior During Therapy  [facility] for tasks assessed/performed       Past Medical History:  Diagnosis Date  . Anemia    hx of  . Arthritis   . 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 Chicken pox   . Depression    hx of in past situational  . Fibromyalgia   . GERD (gastroesophageal reflux disease)   . Headache    hx of migraines  . History of kidney stones    1996  . Hypertension     Past Surgical History:  Procedure Laterality Date  . ABDOMINAL HYSTERECTOMY    . CHOLECYSTECTOMY     03-31-18 [name]  . CHOLECYSTECTOMY N/A 03/31/2018   Procedure: LAPAROSCOPIC CHOLECYSTECTOMY;  Surgeon: Ralene Ok, MD;  Location: [facility];  Service: General;  Laterality: N/A;  . COLONOSCOPY    . endoplantar fasciotomy    . GALLBLADDER SURGERY  03/31/2018  . KNEE ARTHROSCOPY     left knee 2012  . LAPAROSCOPIC CHOLECYSTECTOMY    . REPLACEMENT TOTAL KNEE BILATERAL Bilateral 09/27/2018  . TONSILLECTOMY     1999  . TOTAL KNEE ARTHROPLASTY Bilateral 09/27/2018   Procedure: TOTAL KNEE BILATERAL;  Surgeon: Frederik Pear, MD;  Location: [facility];  Service: Orthopedics;  Laterality: Bilateral;  . UPPER GASTROINTESTINAL ENDOSCOPY      There were no vitals filed for this visit.   Subjective Assessment - 10/11/18 1219    Subjective  Pt. underwent bilateral TKA secondary to  prolonged history of bilateral knee pain with end-stage OA unresolved with conservative tx. efforts. Pt. has completed home health therapy (reports around 5 visits) and presents for outpatient PT evaluation. She is currently ambulating with SPC. Having some expected soreness, reports right knee feels weaker than left with tendency to buckle.    Pertinent History  fibromyalgia, recent lumbosacral pain    Limitations  Lifting;Standing;Walking    How long can you sit comfortably?  no limitations    How long can you stand comfortably?  unable comfortably    How long can you walk comfortably?  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    Currently in Pain?  Yes    Pain Score  2     Pain Location  Knee    Pain Orientation  Right;Left    Pain Descriptors / Indicators  Aching    Pain Type  Surgical pain    Pain Onset  1 to 4 weeks ago    Pain Frequency  Intermittent    Aggravating Factors   activity, knee ROM    Pain Relieving Factors  rest, medication, ice    Effect  of Pain on Daily Activities  limits standing and walking tolerance         [facility] PT Assessment - 10/11/18 0001      Assessment   Medical Diagnosis  s/p bilateral TKA    Referring Provider (PT)  Frederik Pear, MD    Onset Date/Surgical Date  09/27/18    Hand Dominance  Right    Next MD Visit  10/24/2018      Precautions   Precautions  None      Restrictions   Weight Bearing Restrictions  No      Balance Screen   Has the patient fallen in the past 6 months  No      [place] residence    Living Arrangements  Spouse/significant other    Type of [place]  --   5 steps with bilateral rail but has ramp   Home Layout  One level      Prior Function   Level of Independence  Independent with community mobility without device      Cognition   Overall Cognitive Status  Within  Functional Limits for tasks assessed      Observation/Other Assessments   Focus on Therapeutic Outcomes (FOTO)   66% limited      Observation/Other Assessments-Edema    Edema  --   R knee 51 cm, L knee 52 cm at mid-patella     Sensation   Additional Comments  No sensory deficits noted      ROM / Strength   AROM / PROM / Strength  AROM;Strength      AROM   Overall AROM Comments  --   Hip AROM grossly WFL bilat.   AROM Assessment Site  Knee    Right/Left Knee  Right;Left    Right Knee Extension  3    Right Knee Flexion  100    Left Knee Extension  0    Left Knee Flexion  105      Strength   Strength Assessment Site  Knee    Right/Left Knee  Right;Left    Right Knee Flexion  4+/5    Right Knee Extension  4/5    Left Knee Flexion  5/5    Left Knee Extension  4/5      Ambulation/Gait   Assistive device  Straight cane   cane in RUE   Gait Comments  antalgic gait pattern but ambulates mod I                Objective measurements completed on examination: See above findings.      [facility] Adult PT Treatment/Exercise - 10/11/18 0001      Exercises   Exercises  Knee/Hip      Knee/Hip Exercises: Seated   Long Arc Quad  Both;20 reps    Other Seated Knee/Hip Exercises  seated flexion stretch x 10 ea. bilat. 5-10 sec holds with opp. LE assist      Knee/Hip Exercises: Supine   Quad Sets  Both;15 reps    Short Arc Quad Sets  Both;10 reps    Heel Slides  Both;10 reps    Straight Leg Raises  Both;10 reps    Knee Extension  Right    Knee Extension Limitations  supine right knee ext stretch with leg propped x 2 min    Other Supine Knee/Hip Exercises  glut sets x 10      Modalities  Modalities  Cryotherapy      Cryotherapy   Number Minutes Cryotherapy  10 Minutes    Cryotherapy Location  Knee   bilat. knees/quads   Type of Cryotherapy  Ice pack             PT Education - 10/11/18 1224    Education Details  HEP, POC, expected healing timeframe     Person(s) Educated  Patient    Methods  Explanation;Demonstration;Tactile cues;Verbal cues;Handout    Comprehension  Verbalized understanding;Returned demonstration       PT Short Term Goals - 10/11/18 1243      PT SHORT TERM GOAL #1   Title  Independent with HEP    Baseline  needs update from home health HEP    Time  2    Period  Weeks    Status  New      PT SHORT TERM GOAL #2   Title  Increase bilat. knee flexion AROM 5-10 deg to improve ability for transfers from low chairs     Baseline  R 100 deg, L 105 deg    Time  2    Period  Weeks    Status  New        PT Long Term Goals - 10/11/18 1243      PT LONG TERM GOAL #1   Title  Independent with community level ambulation without AD    Baseline  uses SPC    Time  4    Period  Weeks    Status  New    Target Date  11/08/18      PT LONG TERM GOAL #2   Title  Bilat. 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 knee flexion AROM 120 deg to improve ability to descend stairs, bend knees for dressing    Baseline  100 deg R, 105 deg L    Time  4    Period  Weeks    Status  New    Target Date  11/08/18      PT LONG TERM GOAL #4   Title  Right knee extension to neutral for improve heel strike with gait, improved ability upright stance    Time  4    Period  Weeks    Status  New    Target Date  11/08/18             Plan - 10/11/18 1227    Clinical Impression Statement  Pt. presents s/p bilateral TKA with bilateral knee pain, decreased ROM/stiffness, and quad weakness on right>left side. Overall doing well with knee ROM and mobility for timeframe post-op. Pt. would benefit from PT to address current associated functional limitations for mobility status.    History  and Personal Factors relevant to plan of care:  fibromyalgia, past surgical history, LBP history    Clinical Presentation  Stable    Clinical Decision Making  Low    Rehab Potential  Excellent    Clinical Impairments Affecting Rehab Potential  presents with deficits as noted but doing well for timeframe post-op    PT Frequency  3x / week    PT Duration  4 weeks    PT Treatment/Interventions  ADLs/Self Care Home Management;Electrical Stimulation;Functional mobility training;Neuromuscular re-education;Taping;Therapeutic activities;Cryotherapy;Moist Heat;Gait training;Stair training;Balance training;Therapeutic exercise;Patient/family education;Manual techniques;Passive range of motion;Scar mobilization;Vasopneumatic Device    PT Next Visit Plan  focus bilat. knee ROM for flexion, right knee extension, quad activation and strengthening, initial focus open-chain exercises add/progress closed chain activities as tolerated    PT Home Exercise Plan  quad sets, SAQ, SLR, heel slides vs. seated knee flexion stretch, LAQ, sit>stand    Consulted and Agree with Plan of Care  Patient       Patient will benefit from skilled therapeutic intervention in order to improve the following deficits and impairments:  Abnormal gait, Pain, Decreased mobility, Decreased activity tolerance, Decreased endurance, Decreased range of motion, Decreased strength, Obesity, Decreased balance, Difficulty walking  Visit Diagnosis: Left knee pain, unspecified chronicity  Right knee pain, unspecified chronicity  Stiffness of right knee, not elsewhere classified  Stiffness of left knee, not elsewhere classified  Muscle weakness (generalized)  Difficulty in walking, not elsewhere classified     Problem List Patient Active Problem List   Diagnosis Date Noted  . S/P TKR (total knee replacement), bilateral 09/27/2018  . Bilateral primary osteoarthritis of knee 09/26/2018  . Obesity (BMI 35.0-39.9 without comorbidity)  04/06/2018  . High triglycerides 04/06/2018  . Chest pain 07/09/2017  . Fibromyalgia 07/09/2017  . Hypertension 07/09/2017   Beaulah Dinning, PT, DPT 10/11/18 12:48 PM  [facility] [address] [place], [place], [address] Phone: [phone]   Fax:  [phone]  Name: Kristen Fromm MRN: [id number] Date of Birth: 17-Aug-1966

## 2018-10-13 ENCOUNTER — Encounter: Payer: Self-pay | Admitting: Physical Therapy

## 2018-10-16 ENCOUNTER — Encounter: Payer: Self-pay | Admitting: Physical Therapy

## 2018-10-16 ENCOUNTER — Ambulatory Visit: Payer: 59 | Admitting: Physical Therapy

## 2018-10-16 DIAGNOSIS — M25662 Stiffness of left knee, not elsewhere classified: Secondary | ICD-10-CM

## 2018-10-16 DIAGNOSIS — M6281 Muscle weakness (generalized): Secondary | ICD-10-CM | POA: Diagnosis not present

## 2018-10-16 DIAGNOSIS — R262 Difficulty in walking, not elsewhere classified: Secondary | ICD-10-CM | POA: Diagnosis not present

## 2018-10-16 DIAGNOSIS — M25661 Stiffness of right knee, not elsewhere classified: Secondary | ICD-10-CM | POA: Diagnosis not present

## 2018-10-16 DIAGNOSIS — M25562 Pain in left knee: Secondary | ICD-10-CM | POA: Diagnosis not present

## 2018-10-16 DIAGNOSIS — M25561 Pain in right knee: Secondary | ICD-10-CM | POA: Diagnosis not present

## 2018-10-16 NOTE — Therapy (Signed)
[place], [place], [address] Phone: [phone]   Fax:  [phone]  Physical Therapy Treatment  Patient Details  Name: Dawn Reynolds MRN: [id number] Date of Birth: 31-Mar-1966 Referring Provider (PT): Frederik Pear, MD   Encounter Date: 10/16/2018  PT End of Session - 10/16/18 1315    Visit Number  2    Number of Visits  12    Date for PT Re-Evaluation  11/08/18    Authorization Type  [facility] UMR-no visit limit    PT Start Time  1018    PT Stop Time  1115    PT Time Calculation (min)  57 min    Behavior During Therapy  [facility] for tasks assessed/performed       Past Medical History:  Diagnosis Date  . Anemia    hx of  . Arthritis   . 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 Chicken pox   . Depression    hx of in past situational  . Fibromyalgia   . GERD (gastroesophageal reflux disease)   . Headache    hx of migraines  . History of kidney stones    1996  . Hypertension     Past Surgical History:  Procedure Laterality Date  . ABDOMINAL HYSTERECTOMY    . CHOLECYSTECTOMY     03-31-18 [name]  . CHOLECYSTECTOMY N/A 03/31/2018   Procedure: LAPAROSCOPIC CHOLECYSTECTOMY;  Surgeon: Ralene Ok, MD;  Location: [facility];  Service: General;  Laterality: N/A;  . COLONOSCOPY    . endoplantar fasciotomy    . GALLBLADDER SURGERY  03/31/2018  . KNEE ARTHROSCOPY     left knee 2012  . LAPAROSCOPIC CHOLECYSTECTOMY    . REPLACEMENT TOTAL KNEE BILATERAL Bilateral 09/27/2018  . TONSILLECTOMY     1999  . TOTAL KNEE ARTHROPLASTY Bilateral 09/27/2018   Procedure: TOTAL KNEE BILATERAL;  Surgeon: Frederik Pear, MD;  Location[facility];  Service: Orthopedics;  Laterality: Bilateral;  . UPPER GASTROINTESTINAL ENDOSCOPY      There were no vitals filed for this visit.  Subjective Assessment - 10/16/18 1022    Subjective  RT thigh and calf sore since last session.  RT weaker.  I use [employer] rom.    Currently in Pain?  Yes    Pain Score  --   varies moderate  muscles vs joint   Pain Location  Knee    Pain Orientation  Right;Left    Pain Descriptors / Indicators  Aching;Sore    Pain Type  Surgical pain    Pain Frequency  Intermittent    Aggravating Factors   activity,  ROM    Pain Relieving Factors  rest meds,  ice, heat    Effect of Pain on Daily Activities  Limits standing, walking,  not working         [facility] PT Assessment - 10/16/18 0001      AROM   Right Knee Flexion  115    Left Knee Flexion  122                   [facility] Adult PT Treatment/Exercise - 10/16/18 0001      Knee/Hip Exercises: Stretches   Gastroc Stretch  3 reps;30 seconds    Gastroc Stretch Limitations  incline      Knee/Hip Exercises: Standing   Gait Training  on level,  cued rotation,  heel strike.       Knee/Hip Exercises: Seated  Sit to [employer]  5 reps      Knee/Hip Exercises: Supine   Quad Sets  10 reps    [employer] Limitations  CUES    Short Arc Quad Sets  10 reps;Both    Short Arc [employer] Limitations  tried weight,  painful   6/10 pain right so no weights 3 LBS Left   Heel Slides  Both    Heel Slides Limitations  no shin pain with this.     Other Supine Knee/Hip Exercises  manually resisted  hamstring left painful with shooting pain in shin      Cryotherapy   Number Minutes Cryotherapy  10 Minutes    Cryotherapy Location  Knee   thighs   Type of Cryotherapy  --   cold pack     Manual Therapy   Manual therapy comments  retrograde soft tissue worl both    Joint Mobilization  P/A glides with movement    Passive ROM  flexion both             PT Education - 10/16/18 1314    Education Details  HEP,  exercise form    Person(s) Educated  Patient    Methods  Explanation;Demonstration;Tactile cues;Verbal cues    Comprehension  Verbalized understanding;Returned demonstration       PT Short Term Goals - 10/11/18 1243      PT SHORT TERM GOAL #1   Title  Independent  with HEP    Baseline  needs update from home health HEP    Time  2    Period  Weeks    Status  New      PT SHORT TERM GOAL #2   Title  Increase bilat. knee flexion AROM 5-10 deg to improve ability for transfers from low chairs     Baseline  R 100 deg, L 105 deg    Time  2    Period  Weeks    Status  New        PT Long Term Goals - 10/11/18 1243      PT LONG TERM GOAL #1   Title  Independent with community level ambulation without AD    Baseline  uses SPC    Time  4    Period  Weeks    Status  New    Target Date  11/08/18      PT LONG TERM GOAL #2   Title  Bilat. 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 knee flexion AROM 120 deg to improve ability to descend stairs, bend knees for dressing    Baseline  100 deg R, 105 deg L    Time  4    Period  Weeks    Status  New    Target Date  11/08/18      PT LONG TERM GOAL #4   Title  Right knee extension to neutral for improve heel strike with gait, improved ability upright stance    Time  4    Period  Weeks    Status  New    Target Date  11/08/18            Plan - 10/16/18 1315    Clinical Impression Statement  ROM excellent flexion  see flow sheet.  patient wants to be ready for work in January.  Mod soreness post session addressed with cold pack.     PT Next Visit Plan  focus bilat. knee ROM for flexion, right knee extension, quad activation and strengthening, initial focus open-chain exercises add/progress closed chain activities as tolerated    PT Home Exercise Plan  quad sets, SAQ, SLR, heel slides vs. seated knee flexion stretch, LAQ, sit>stand,  gait training    Consulted and Agree with Plan of Care  Patient       Patient will benefit from skilled  therapeutic intervention in order to improve the following deficits and impairments:     Visit Diagnosis: Right knee pain, unspecified chronicity  Left knee pain, unspecified chronicity  Stiffness of right knee, not elsewhere classified  Stiffness of left knee, not elsewhere classified  Muscle weakness (generalized)  Difficulty in walking, not elsewhere classified     Problem List Patient Active Problem List   Diagnosis Date Noted  . S/P TKR (total knee replacement), bilateral 09/27/2018  . Bilateral primary osteoarthritis of knee 09/26/2018  . Obesity (BMI 35.0-39.9 without comorbidity) 04/06/2018  . High triglycerides 04/06/2018  . Chest pain 07/09/2017  . Fibromyalgia 07/09/2017  . Hypertension 07/09/2017    Ramia Sidney PTA 10/16/2018, 1:17 PM  [place], [place], [address] Phone: [phone]   Fax:  [phone]  Name: Dawn Reynolds MRN: [id number] Date of Birth: [birth year]

## 2018-10-16 NOTE — Patient Instructions (Signed)
Calf Stretch    Place one leg forward, bent, other leg behind and straight. Lean forward keeping back heel flat. Hold __30__ seconds while counting out loud. Repeat with other leg forward. Repeat __3_ times. Do _1___ sessions per day. 3 http://gt2.exer.us/478   Copyright  VHI. All rights reserved.

## 2018-10-18 ENCOUNTER — Ambulatory Visit: Payer: 59 | Admitting: Physical Therapy

## 2018-10-18 ENCOUNTER — Encounter: Payer: Self-pay | Admitting: Physical Therapy

## 2018-10-18 DIAGNOSIS — R262 Difficulty in walking, not elsewhere classified: Secondary | ICD-10-CM

## 2018-10-18 DIAGNOSIS — M6281 Muscle weakness (generalized): Secondary | ICD-10-CM

## 2018-10-18 DIAGNOSIS — M25562 Pain in left knee: Secondary | ICD-10-CM

## 2018-10-18 DIAGNOSIS — M25661 Stiffness of right knee, not elsewhere classified: Secondary | ICD-10-CM | POA: Diagnosis not present

## 2018-10-18 DIAGNOSIS — M25561 Pain in right knee: Secondary | ICD-10-CM | POA: Diagnosis not present

## 2018-10-18 DIAGNOSIS — M25662 Stiffness of left knee, not elsewhere classified: Secondary | ICD-10-CM | POA: Diagnosis not present

## 2018-10-18 NOTE — Therapy (Signed)
[place], [place], [address] Phone: [phone]   Fax:  [phone]  Physical Therapy Treatment  Patient Details  Name: Dawn Reynolds MRN: [id number] Date of Birth: May 19, 1966 Referring Provider (PT): Frederik Pear, MD   Encounter Date: 10/18/2018  PT End of Session - 10/18/18 1539    Visit Number  3    Number of Visits  12    Date for PT Re-Evaluation  11/08/18    Authorization Type  [facility] UMR-no visit limit    PT Start Time  6063    PT Stop Time  1554    PT Time Calculation (min)  48 min    Activity Tolerance  Patient tolerated treatment well    Behavior During Therapy  [facility] for tasks assessed/performed       Past Medical History:  Diagnosis Date  . Anemia    hx of  . Arthritis   . 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 Chicken pox   . Depression    hx of in past situational  . Fibromyalgia   . GERD (gastroesophageal reflux disease)   . Headache    hx of migraines  . History of kidney stones    1996  . Hypertension     Past Surgical History:  Procedure Laterality Date  . ABDOMINAL HYSTERECTOMY    . CHOLECYSTECTOMY     03-31-18 [name]  . CHOLECYSTECTOMY N/A 03/31/2018   Procedure: LAPAROSCOPIC CHOLECYSTECTOMY;  Surgeon: Ralene Ok, MD;  Location: [facility];  Service: General;  Laterality: N/A;  . COLONOSCOPY    . endoplantar fasciotomy    . GALLBLADDER SURGERY  03/31/2018  . KNEE ARTHROSCOPY     left knee 2012  . LAPAROSCOPIC CHOLECYSTECTOMY    . REPLACEMENT TOTAL KNEE BILATERAL Bilateral 09/27/2018  . TONSILLECTOMY     1999  . TOTAL KNEE ARTHROPLASTY Bilateral 09/27/2018   Procedure: TOTAL KNEE BILATERAL;  Surgeon: Frederik Pear, MD;  [facility]: [facility];  Service: Orthopedics;  Laterality: Bilateral;  . UPPER GASTROINTESTINAL ENDOSCOPY      There were no vitals filed for this visit.  Subjective Assessment - 10/18/18 1509    Subjective  Tried standing for about 10 minutes without  AD but had difficulty with soreness. Reports sore after patellar mobs after last session.    Currently in Pain?  Yes    Pain Score  4     Pain Location  Knee    Pain Orientation  Left;Right    Pain Descriptors / Indicators  Aching;Sore    Pain Type  Surgical pain    Pain Onset  1 to 4 weeks ago    Pain Frequency  Intermittent    Aggravating Factors   activity, ROM, standing    Pain Relieving Factors  rest, medication, ice, heat    Effect of Pain on Daily Activities  limits tolerance standing                       [facility] Adult PT Treatment/Exercise - 10/18/18 0001      Exercises   Exercises  Knee/Hip;Ankle      Knee/Hip Exercises: Stretches   Passive Hamstring Stretch  Both;3 reps;20 seconds      Knee/Hip Exercises: Aerobic   Recumbent Bike  L1x 6 minutes      Knee/Hip Exercises: Standing   Hip Abduction  AAROM;Stengthening;Right;Left;10 reps    Abduction Limitations  2 lbs.    Functional Squat  15 reps   "mini" squat at parallel bars     Knee/Hip Exercises: Seated   Long Arc Quad  Both;20 reps    Long Arc Quad Weight  2 lbs.      Knee/Hip Exercises: Supine   Quad Sets  20 reps    Short Arc Quad Sets  Right;Left;1 set;15 reps    Short Arc Quad Sets Limitations  2 bs., able without pain    Heel Slides  AAROM;Both;15 reps    Straight Leg Raises  Right;Left;15 reps    Other Supine Knee/Hip Exercises  glut sets x 20      Cryotherapy   Number Minutes Cryotherapy  10 Minutes    Cryotherapy Location  Knee   Bilat. knees/quads   Type of Cryotherapy  --   gel packs     Ankle Exercises: Standing   Rocker Board  1 minute    Heel Raises  20 reps   Airex            PT Education - 10/18/18 1539    Education Details  HEP, POC    Person(s) Educated  Patient    Methods  Explanation;Demonstration    Comprehension  Verbalized understanding;Returned demonstration       PT Short Term Goals - 10/18/18 1546      PT SHORT TERM GOAL #1   Title   Independent with HEP    Baseline  met with initial HEP    Time  2    Period  Weeks    Status  Achieved      PT SHORT TERM GOAL #2   Title  Increase bilat. knee flexion AROM 5-10 deg to improve ability for transfers from low chairs     Baseline  R 115, L 122    Time  2    Period  Weeks    Status  Achieved        PT Long Term Goals - 10/18/18 1546      PT LONG TERM GOAL #1   Title  Independent with community level ambulation without AD    Baseline  uses SPC    Time  4    Period  Weeks    Status  On-going      PT LONG TERM GOAL #2   Title  Bilat. 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 knee flexion AROM 120 deg to improve ability to descend stairs, bend knees for dressing    Baseline  R 115, L 122    Time  4    Period  Weeks    Status  Partially Met      PT LONG TERM GOAL #4   Title  Right knee extension to neutral for improve heel strike with gait, improved ability upright stance    Baseline  3 deg    Time  4    Period  Weeks    Status  On-going            Plan - 10/18/18 1540    Clinical Impression Statement  Pt. progressing very well with knee ROM and mobility status 3 weeks s/p TKA. Some  expected soreness and quad weakness but doing very well overall with therapy goals,    PT Frequency  3x / week    PT Duration  4 weeks    PT Treatment/Interventions  ADLs/Self Care Home Management;Electrical Stimulation;Functional mobility training;Neuromuscular re-education;Taping;Therapeutic activities;Cryotherapy;Moist Heat;Gait training;Stair training;Balance training;Therapeutic exercise;Patient/family education;Manual techniques;Passive range of motion;Scar mobilization;Vasopneumatic Device    PT Next Visit Plan  try adding step ups by  next week otherwise continue POC    PT Home Exercise Plan  quad sets, SAQ, SLR, heel slides vs. seated knee flexion stretch, LAQ, sit>stand vs. partial squat,  gait training    Consulted and Agree with Plan of Care  Patient       Patient will benefit from skilled therapeutic intervention in order to improve the following deficits and impairments:  Abnormal gait, Pain, Decreased mobility, Decreased activity tolerance, Decreased endurance, Decreased range of motion, Decreased strength, Obesity, Decreased balance, Difficulty walking  Visit Diagnosis: Right knee pain, unspecified chronicity  Left knee pain, unspecified chronicity  Stiffness of right knee, not elsewhere classified  Stiffness of left knee, not elsewhere classified  Muscle weakness (generalized)  Difficulty in walking, not elsewhere classified     Problem List Patient Active Problem List   Diagnosis Date Noted  . S/P TKR (total knee replacement), bilateral 09/27/2018  . Bilateral primary osteoarthritis of knee 09/26/2018  . Obesity (BMI 35.0-39.9 without comorbidity) 04/06/2018  . High triglycerides 04/06/2018  . Chest pain 07/09/2017  . Fibromyalgia 07/09/2017  . Hypertension 07/09/2017   Beaulah Dinning, PT, DPT 10/18/18 3:49 PM  [facility] [address] [place], [place], [address] Phone: [phone]   Fax:  [phone]  Name: Dawn Reynolds MRN: [id number] Date of Birth: [birth year]

## 2018-10-19 ENCOUNTER — Ambulatory Visit: Payer: 59 | Admitting: Physical Therapy

## 2018-10-19 ENCOUNTER — Encounter: Payer: Self-pay | Admitting: Physical Therapy

## 2018-10-19 DIAGNOSIS — M6281 Muscle weakness (generalized): Secondary | ICD-10-CM | POA: Diagnosis not present

## 2018-10-19 DIAGNOSIS — M25661 Stiffness of right knee, not elsewhere classified: Secondary | ICD-10-CM | POA: Diagnosis not present

## 2018-10-19 DIAGNOSIS — M25561 Pain in right knee: Secondary | ICD-10-CM

## 2018-10-19 DIAGNOSIS — R262 Difficulty in walking, not elsewhere classified: Secondary | ICD-10-CM

## 2018-10-19 DIAGNOSIS — M25662 Stiffness of left knee, not elsewhere classified: Secondary | ICD-10-CM | POA: Diagnosis not present

## 2018-10-19 DIAGNOSIS — M25562 Pain in left knee: Secondary | ICD-10-CM | POA: Diagnosis not present

## 2018-10-19 NOTE — Therapy (Signed)
[place], [place], [address] Phone: [phone]   Fax:  [phone]  Physical Therapy Treatment  Patient Details  Name: Dawn Reynolds MRN: [id number] Date of Birth: 1966/08/17 Referring Provider (PT): Dawn Pear, MD   Encounter Date: 10/19/2018  PT End of Session - 10/19/18 0813    Visit Number  4    Number of Visits  12    Date for PT Re-Evaluation  11/08/18    Authorization Type  [facility] UMR-no visit limit    Authorization - Visit Number  6    Authorization - Number of Visits  8    PT Start Time  0732   30 minute apt with cold pack   PT Stop Time  0815    PT Time Calculation (min)  43 min    Activity Tolerance  Patient tolerated treatment well    Behavior During Therapy  [facility] for tasks assessed/performed       Past Medical History:  Diagnosis Date  . Anemia    hx of  . Arthritis   . 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 Chicken pox   . Depression    hx of in past situational  . Fibromyalgia   . GERD (gastroesophageal reflux disease)   . Headache    hx of migraines  . History of kidney stones    1996  . Hypertension     Past Surgical History:  Procedure Laterality Date  . ABDOMINAL HYSTERECTOMY    . CHOLECYSTECTOMY     03-31-18 Dawn Reynolds  . CHOLECYSTECTOMY N/A 03/31/2018   Procedure: LAPAROSCOPIC CHOLECYSTECTOMY;  Surgeon: Dawn Ok, MD;  Location: [facility];  Service: General;  Laterality: N/A;  . COLONOSCOPY    . endoplantar fasciotomy    . GALLBLADDER SURGERY  03/31/2018  . KNEE ARTHROSCOPY     left knee 2012  . LAPAROSCOPIC CHOLECYSTECTOMY    . REPLACEMENT TOTAL KNEE BILATERAL Bilateral 09/27/2018  . TONSILLECTOMY     1999  . TOTAL KNEE ARTHROPLASTY Bilateral 09/27/2018   Procedure: TOTAL KNEE BILATERAL;  Surgeon: Dawn Pear, MD;  Location: [facility];  Service: Orthopedics;  Laterality: Bilateral;  . UPPER GASTROINTESTINAL ENDOSCOPY      There were no vitals filed for this  visit.  Subjective Assessment - 10/19/18 0735    Subjective  Sore from the work out yesterday.    Currently in Pain?  Yes    Pain Score  5     Pain Location  Knee    Pain Orientation  Right;Left    Pain Descriptors / Indicators  Aching;Sore                       [facility] Adult PT Treatment/Exercise - 10/19/18 0001      Knee/Hip Exercises: Aerobic   Recumbent Bike  3 minutes L1      Knee/Hip Exercises: Standing   Forward Step Up  Both;1 set;10 reps;Hand Hold: 2;Step Height: 4"    Forward Step Up Limitations  needs hands for balance    Functional Squat  10 reps    Functional Squat Limitations  cued initially    Other Standing Knee Exercises  side step over 3 hurdles 4 X both ways ans forward 2 ways   SBA   fatigues cued initially ,  technique improved with reps.      Other Standing Knee Exercises  Facing wall slide 5 X each side close SBA  Cryotherapy   Number Minutes Cryotherapy  15 Minutes    Cryotherapy Location  Knee   both   Type of Cryotherapy  --   cold pack     Manual Therapy   Manual therapy comments  retrograde soft tissue worl both    Joint Mobilization  P/A glides with movement    Passive ROM  flexion both,  Extension checked              PT Education - 10/19/18 0812    Education Details  exercise form    Person(s) Educated  Patient    Methods  Explanation;Demonstration;Tactile cues;Verbal cues    Comprehension  Returned demonstration;Verbalized understanding       PT Short Term Goals - 10/18/18 1546      PT SHORT TERM GOAL #1   Title  Independent with HEP    Baseline  met with initial HEP    Time  2    Period  Weeks    Status  Achieved      PT SHORT TERM GOAL #2   Title  Increase bilat. knee flexion AROM 5-10 deg to improve ability for transfers from low chairs     Baseline  R 115, L 122    Time  2    Period  Weeks    Status  Achieved        PT Long Term Goals - 10/18/18 1546      PT LONG TERM GOAL #1   Title   Independent with community level ambulation without AD    Baseline  uses SPC    Time  4    Period  Weeks    Status  On-going      PT LONG TERM GOAL #2   Title  Bilat. 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 knee flexion AROM 120 deg to improve ability to descend stairs, bend knees for dressing    Baseline  R 115, L 122    Time  4    Period  Weeks    Status  Partially Met      PT LONG TERM GOAL #4   Title  Right knee extension to neutral for improve heel strike with gait, improved ability upright stance    Baseline  3 deg    Time  4    Period  Weeks    Status  On-going            Plan - 10/19/18 0815    Clinical Impression Statement  ROM continues to be well.  She was able to progress standing  to step ups 4 inches with min use of hands.  Pain 3/10 at end of session.     PT Next Visit Plan  continue step ups. continue POC     PT Home Exercise Plan  quad sets, SAQ, SLR, heel slides vs. seated knee flexion stretch, LAQ, sit>stand vs. partial squat,  gait training    Consulted and Agree with Plan of Care  Patient       Patient will benefit from skilled therapeutic intervention in order to improve the following deficits and impairments:  Visit Diagnosis: Right knee pain, unspecified chronicity  Left knee pain, unspecified chronicity  Stiffness of right knee, not elsewhere classified  Stiffness of left knee, not elsewhere classified  Muscle weakness (generalized)  Difficulty in walking, not elsewhere classified  Difficulty walking     Problem List Patient Active Problem List   Diagnosis Date Noted  . S/P TKR (total knee replacement), bilateral 09/27/2018  . Bilateral primary osteoarthritis of knee 09/26/2018  . Obesity  (BMI 35.0-39.9 without comorbidity) 04/06/2018  . High triglycerides 04/06/2018  . Chest pain 07/09/2017  . Fibromyalgia 07/09/2017  . Hypertension 07/09/2017    Dawn Reynolds  PTA 10/19/2018, 8:21 AM  [place], [place], [address] Phone: [phone]   Fax:  [phone]  Name: Dawn Reynolds MRN: [id number] Date of Birth: 11/19/1965

## 2018-10-23 ENCOUNTER — Ambulatory Visit: Payer: 59 | Admitting: Physical Therapy

## 2018-10-24 DIAGNOSIS — M170 Bilateral primary osteoarthritis of knee: Secondary | ICD-10-CM | POA: Diagnosis not present

## 2018-10-25 ENCOUNTER — Encounter: Payer: Self-pay | Admitting: Physical Therapy

## 2018-10-25 ENCOUNTER — Ambulatory Visit: Payer: 59 | Admitting: Physical Therapy

## 2018-10-25 DIAGNOSIS — M25562 Pain in left knee: Secondary | ICD-10-CM

## 2018-10-25 DIAGNOSIS — M25561 Pain in right knee: Secondary | ICD-10-CM

## 2018-10-25 DIAGNOSIS — M25661 Stiffness of right knee, not elsewhere classified: Secondary | ICD-10-CM

## 2018-10-25 DIAGNOSIS — M25662 Stiffness of left knee, not elsewhere classified: Secondary | ICD-10-CM | POA: Diagnosis not present

## 2018-10-25 DIAGNOSIS — M6281 Muscle weakness (generalized): Secondary | ICD-10-CM | POA: Diagnosis not present

## 2018-10-25 DIAGNOSIS — R262 Difficulty in walking, not elsewhere classified: Secondary | ICD-10-CM

## 2018-10-25 NOTE — Therapy (Signed)
[place], [place], [address] Phone: [phone]   Fax:  [phone]  Physical Therapy Treatment  Patient Details  Name: Dawn Reynolds MRN: [id number] Date of Birth: July 05, 1966 Referring Provider (PT): Frederik Pear, MD   Encounter Date: 10/25/2018  PT End of Session - 10/25/18 0849    Visit Number  5    Number of Visits  12    Date for PT Re-Evaluation  11/08/18    Authorization Type  [facility] UMR-no visit limit    Authorization - Number of Visits  8    PT Start Time  0849    PT Stop Time  0940    PT Time Calculation (min)  51 min    Activity Tolerance  Patient tolerated treatment well       Past Medical History:  Diagnosis Date  . Anemia    hx of  . Arthritis   . 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 Chicken pox   . Depression    hx of in past situational  . Fibromyalgia   . GERD (gastroesophageal reflux disease)   . Headache    hx of migraines  . History of kidney stones    1996  . Hypertension     Past Surgical History:  Procedure Laterality Date  . ABDOMINAL HYSTERECTOMY    . CHOLECYSTECTOMY     03-31-18 [name]  . CHOLECYSTECTOMY N/A 03/31/2018   Procedure: LAPAROSCOPIC CHOLECYSTECTOMY;  Surgeon: Ralene Ok, MD;  Location: [facility];  Service: General;  Laterality: N/A;  . COLONOSCOPY    . endoplantar fasciotomy    . GALLBLADDER SURGERY  03/31/2018  . KNEE ARTHROSCOPY     left knee 2012  . LAPAROSCOPIC CHOLECYSTECTOMY    . REPLACEMENT TOTAL KNEE BILATERAL Bilateral 09/27/2018  . TONSILLECTOMY     1999  . TOTAL KNEE ARTHROPLASTY Bilateral 09/27/2018   Procedure: TOTAL KNEE BILATERAL;  Surgeon: Frederik Pear, MD;  Location: [facility];  Service: Orthopedics;  Laterality: Bilateral;  . UPPER GASTROINTESTINAL ENDOSCOPY      There were no vitals filed for this visit.  Subjective Assessment - 10/25/18 0849    Subjective  saw MD yesterday, he explained to her the reasons for her lower leg  pain as the bone heals.     Currently in Pain?  Yes    Pain Score  3     Pain Orientation  Right    Pain Descriptors / Indicators  Aching    Pain Type  Surgical pain    Pain Onset  More than a month ago    Pain Frequency  Intermittent    Aggravating Factors   activity, walking, sit to stand     Pain Relieving Factors  rest and ice         [facility] PT Assessment - 10/25/18 0001      Assessment   Medical Diagnosis  s/p bilateral TKA    Referring Provider (PT)  Frederik Pear, MD    Onset Date/Surgical Date  09/27/18      AROM   Right Knee Flexion  125    Left Knee Flexion  130                   [facility] Adult PT Treatment/Exercise - 10/25/18 0001      Exercises   Exercises  Knee/Hip      Knee/Hip Exercises: Stretches   Hip Flexor Stretch  Both;2 reps;20 seconds   leg  [employer].    ITB Stretch  Both;2 reps;20 seconds   cross body with strap   Other Knee/Hip Stretches  SKTC 30 sec each side      Knee/Hip Exercises: Aerobic   Recumbent Bike  L2x5'      Knee/Hip Exercises: Seated   Long Arc Quad  Strengthening;Both;3 sets;10 reps   2-3 sec holds   Sit to [employer]  20 reps;without UE support   from high surface     Knee/Hip Exercises: Supine   Bridges  Strengthening;Both;10 reps;3 sets    Straight Leg Raises  Strengthening;Both;2 sets;10 reps   adding in hip ab/adduction   Straight Leg Raises Limitations  ques to engage core      Modalities   Modalities  Cryotherapy      Cryotherapy   Number Minutes Cryotherapy  15 Minutes    Cryotherapy Location  Knee   bilat   Type of Cryotherapy  Ice pack               PT Short Term Goals - 10/18/18 1546      PT SHORT TERM GOAL #1   Title  Independent with HEP    Baseline  met with initial HEP    Time  2    Period  Weeks    Status  Achieved      PT SHORT TERM GOAL #2   Title  Increase bilat. knee flexion AROM 5-10 deg to improve ability for transfers from low chairs     Baseline  R 115, L 122    Time  2     Period  Weeks    Status  Achieved        PT Long Term Goals - 10/25/18 0920      PT LONG TERM GOAL #1   Title  Independent with community level ambulation without AD    Status  Achieved      PT LONG TERM GOAL #2   Title  Bilat. 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 knee flexion AROM 120 deg to improve ability to descend stairs, bend knees for dressing    Status  Achieved      PT LONG TERM GOAL #4   Title  Right knee extension to neutral for improve heel strike with gait, improved ability upright stance    Status  On-going            Plan - 10/25/18 0920    Clinical Impression Statement  Dawn Reynolds knee ROM is excellent, strength is her biggest limitation as is some back pain. She reports difficulty manuevering curbs and pain with sit to stand tranfers.      Rehab Potential  Excellent    Clinical Impairments Affecting Rehab Potential  presents with deficits as noted but doing well for timeframe post-op    PT Frequency  3x / week    PT Treatment/Interventions  ADLs/Self Care Home Management;Electrical Stimulation;Functional mobility training;Neuromuscular re-education;Taping;Therapeutic activities;Cryotherapy;Moist Heat;Gait training;Stair training;Balance training;Therapeutic exercise;Patient/family education;Manual techniques;Passive range of motion;Scar mobilization;Vasopneumatic Device    PT Next Visit Plan  functional strengthening    Consulted and Agree with Plan of Care  Patient       Patient will benefit from skilled therapeutic intervention in order to improve the following deficits and impairments:  Abnormal gait,  Pain, Decreased mobility, Decreased activity tolerance, Decreased endurance, Decreased range of motion, Decreased strength, Obesity, Decreased balance, Difficulty walking  Visit Diagnosis: Right knee pain,  unspecified chronicity  Left knee pain, unspecified chronicity  Stiffness of right knee, not elsewhere classified  Stiffness of left knee, not elsewhere classified  Muscle weakness (generalized)  Difficulty in walking, not elsewhere classified     Problem List Patient Active Problem List   Diagnosis Date Noted  . S/P TKR (total knee replacement), bilateral 09/27/2018  . Bilateral primary osteoarthritis of knee 09/26/2018  . Obesity (BMI 35.0-39.9 without comorbidity) 04/06/2018  . High triglycerides 04/06/2018  . Chest pain 07/09/2017  . Fibromyalgia 07/09/2017  . Hypertension 07/09/2017    Jeral Pinch PT  10/25/2018, 9:30 AM  [facility] [address] [place], [place], [address] Phone: [phone]   Fax:  [phone]  Name: Dawn Reynolds MRN: [id number] Date of Birth: [birth year]

## 2018-10-27 ENCOUNTER — Encounter: Payer: Self-pay | Admitting: Physical Therapy

## 2018-10-27 ENCOUNTER — Ambulatory Visit: Payer: 59 | Admitting: Physical Therapy

## 2018-10-27 DIAGNOSIS — M6281 Muscle weakness (generalized): Secondary | ICD-10-CM

## 2018-10-27 DIAGNOSIS — M25562 Pain in left knee: Secondary | ICD-10-CM

## 2018-10-27 DIAGNOSIS — M25661 Stiffness of right knee, not elsewhere classified: Secondary | ICD-10-CM | POA: Diagnosis not present

## 2018-10-27 DIAGNOSIS — R262 Difficulty in walking, not elsewhere classified: Secondary | ICD-10-CM

## 2018-10-27 DIAGNOSIS — M25561 Pain in right knee: Secondary | ICD-10-CM

## 2018-10-27 DIAGNOSIS — M25662 Stiffness of left knee, not elsewhere classified: Secondary | ICD-10-CM | POA: Diagnosis not present

## 2018-10-27 NOTE — Therapy (Signed)
[place], [place], [address] Phone: [phone]   Fax:  [phone]  Physical Therapy Treatment  Patient Details  Name: Dawn Reynolds MRN: [id number] Date of Birth: [birth year] Referring Provider (PT): Frederik Pear, MD   Encounter Date: 10/27/2018  PT End of Session - 10/27/18 0854    Visit Number  6    Number of Visits  12    Date for PT Re-Evaluation  11/08/18    Authorization Type  [facility] UMR-no visit limit    PT Start Time  9211   in late   PT Stop Time  0946    PT Time Calculation (min)  53 min    Activity Tolerance  Patient tolerated treatment well       Past Medical History:  Diagnosis Date  . Anemia    hx of  . Arthritis   . 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 Chicken pox   . Depression    hx of in past situational  . Fibromyalgia   . GERD (gastroesophageal reflux disease)   . Headache    hx of migraines  . History of kidney stones    1996  . Hypertension     Past Surgical History:  Procedure Laterality Date  . ABDOMINAL HYSTERECTOMY    . CHOLECYSTECTOMY     03-31-18 [name]  . CHOLECYSTECTOMY N/A 03/31/2018   Procedure: LAPAROSCOPIC CHOLECYSTECTOMY;  Surgeon: Ralene Ok, MD;  Location: [facility];  Service: General;  Laterality: N/A;  . COLONOSCOPY    . endoplantar fasciotomy    . GALLBLADDER SURGERY  03/31/2018  . KNEE ARTHROSCOPY     left knee 2012  . LAPAROSCOPIC CHOLECYSTECTOMY    . REPLACEMENT TOTAL KNEE BILATERAL Bilateral 09/27/2018  . TONSILLECTOMY     1999  . TOTAL KNEE ARTHROPLASTY Bilateral 09/27/2018   Procedure: TOTAL KNEE BILATERAL;  Surgeon: Frederik Pear, MD;  Location: [facility];  Service: Orthopedics;  Laterality: Bilateral;  . UPPER GASTROINTESTINAL ENDOSCOPY      There were no vitals filed for this visit.  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    Currently in Pain?  No/denies   feels very slightly - hasn't had time to think about it yet today                      [facility] Adult PT Treatment/Exercise - 10/27/18 0001      Exercises   Exercises  Knee/Hip      Knee/Hip Exercises: Stretches   Passive Hamstring Stretch  Both;30 seconds   supine with stra   ITB Stretch  Both;30 seconds   cross body with strap     Knee/Hip Exercises: Aerobic   Recumbent Bike  L2x5'      Knee/Hip Exercises: Standing   Forward Step Up  Both;2 sets;Step Height: 8"    Forward Step Up Limitations  needs hands     SLS  3x10 each side with toe taps FWD/side/BWD      Knee/Hip Exercises: Seated   Hamstring Curl  Strengthening;Both;3 sets;10 reps   with blue band     Knee/Hip Exercises: Supine   Single Leg Bridge  Strengthening;Both;2 sets;10 reps   figure 4     Knee/Hip Exercises: Sidelying   Other Sidelying Knee/Hip Exercises  reverse clams with leg in abduction and extension 3x10, VC for form, Rt leg had to lower down.       Modalities   Modalities  Cryotherapy      Cryotherapy   Number Minutes Cryotherapy  12 Minutes    Cryotherapy Location  Knee   bilat   Type of Cryotherapy  Ice pack               PT Short Term Goals - 10/18/18 1546      PT SHORT TERM GOAL #1   Title  Independent with HEP    Baseline  met with initial HEP    Time  2    Period  Weeks    Status  Achieved      PT SHORT TERM GOAL #2   Title  Increase bilat. knee flexion AROM 5-10 deg to improve ability for transfers from low chairs     Baseline  R 115, L 122    Time  2    Period  Weeks    Status  Achieved        PT Long Term Goals - 10/25/18 0920      PT LONG TERM GOAL #1   Title  Independent with community level ambulation without AD    Status  Achieved      PT LONG TERM GOAL #2   Title  Bilat. 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 knee flexion AROM 120 deg to improve ability to descend stairs, bend knees for dressing    Status  Achieved      PT LONG TERM GOAL #4   Title  Right knee extension to neutral for improve heel strike with gait, improved ability upright stance    Status  On-going            Plan - 10/27/18 1037    Clinical Impression Statement  Dawn Reynolds fatigued with standing exercise and higher level hip strengthening.  Still having some tibial pain    Rehab Potential  Excellent    PT Frequency  3x / week    PT Duration  4 weeks    PT Next Visit Plan  functional strengthening    Consulted and Agree with Plan of Care  Patient       Patient will benefit from skilled therapeutic intervention in order to improve the following deficits and impairments:  Abnormal gait, Pain, Decreased mobility, Decreased activity tolerance, Decreased endurance, Decreased range of motion, Decreased strength, Obesity, Decreased balance, Difficulty walking  Visit Diagnosis: Right knee pain, unspecified chronicity  Left knee pain, unspecified chronicity  Muscle weakness (generalized)  Difficulty in walking, not elsewhere classified     Problem List Patient Active Problem List   Diagnosis Date Noted  . S/P TKR (total knee replacement), bilateral 09/27/2018  . Bilateral primary osteoarthritis of knee 09/26/2018  . Obesity (BMI 35.0-39.9 without comorbidity) 04/06/2018  . High triglycerides 04/06/2018  . Chest pain 07/09/2017  . Fibromyalgia 07/09/2017  . Hypertension 07/09/2017    Jeral Pinch PT  10/27/2018, 10:39 AM  [facility] [address] [place], [place], [address] Phone: [phone]   Fax:  [phone]  Name: Dawn Reynolds MRN: [id number] Date of Birth: 05/25/1966

## 2018-10-30 ENCOUNTER — Ambulatory Visit: Payer: 59 | Admitting: Physical Therapy

## 2018-10-30 ENCOUNTER — Encounter: Payer: Self-pay | Admitting: Physical Therapy

## 2018-10-30 DIAGNOSIS — R262 Difficulty in walking, not elsewhere classified: Secondary | ICD-10-CM

## 2018-10-30 DIAGNOSIS — M25562 Pain in left knee: Secondary | ICD-10-CM | POA: Diagnosis not present

## 2018-10-30 DIAGNOSIS — M25661 Stiffness of right knee, not elsewhere classified: Secondary | ICD-10-CM

## 2018-10-30 DIAGNOSIS — M6281 Muscle weakness (generalized): Secondary | ICD-10-CM | POA: Diagnosis not present

## 2018-10-30 DIAGNOSIS — M25561 Pain in right knee: Secondary | ICD-10-CM | POA: Diagnosis not present

## 2018-10-30 DIAGNOSIS — M25662 Stiffness of left knee, not elsewhere classified: Secondary | ICD-10-CM | POA: Diagnosis not present

## 2018-10-30 MED FILL — DULoxetine HCL 60 MG CPEP: 60 | 90 days supply | Qty: 180 | Fill #2

## 2018-10-30 MED FILL — CYCLOBENZAPRINE 10 MG TAB: 10 | 10 days supply | Qty: 30 | Fill #0

## 2018-10-30 MED FILL — HYDROCHLOROTHIAZIDE 12.5 MG: 12.5 | 90 days supply | Qty: 90 | Fill #2

## 2018-10-30 MED FILL — AMLODIPINE BESYLATE 10 MG T: 10 | 90 days supply | Qty: 90 | Fill #2

## 2018-10-30 MED FILL — OMEPRAZOLE 20 MG CPDR: 20 | 90 days supply | Qty: 90 | Fill #2

## 2018-10-30 MED FILL — METHOCARBAMOL 500 MG TABS: 500 | 30 days supply | Qty: 60 | Fill #1

## 2018-10-30 NOTE — Therapy (Signed)
[place], [place], [address] Phone: [phone]   Fax:  [phone]  Physical Therapy Treatment  Patient Details  Name: Dawn Reynolds MRN: [id number] Date of Birth: Nov 04, 1966 Referring Provider (PT): Frederik Pear, MD   Encounter Date: 10/30/2018  PT End of Session - 10/30/18 0937    Visit Number  7    Number of Visits  12    Date for PT Re-Evaluation  11/08/18    Authorization Type  [facility] UMR-no visit limit    Authorization - Visit Number  --   no visit limit, plan of care from eval 3x/week for 4 weeks   Authorization - Number of Visits  --   no limit, plan of care from eval 3x/week x 4 weeks   PT Start Time  0929    PT Stop Time  1024    PT Time Calculation (min)  55 min    Activity Tolerance  Patient tolerated treatment well    Behavior During Therapy  [facility] for tasks assessed/performed       Past Medical History:  Diagnosis Date  . Anemia    hx of  . Arthritis   . 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 Chicken pox   . Depression    hx of in past situational  . Fibromyalgia   . GERD (gastroesophageal reflux disease)   . Headache    hx of migraines  . History of kidney stones    1996  . Hypertension     Past Surgical History:  Procedure Laterality Date  . ABDOMINAL HYSTERECTOMY    . CHOLECYSTECTOMY     03-31-18 [name]  . CHOLECYSTECTOMY N/A 03/31/2018   Procedure: LAPAROSCOPIC CHOLECYSTECTOMY;  Surgeon: Ralene Ok, MD;  Location: [facility];  Service: General;  Laterality: N/A;  . COLONOSCOPY    . endoplantar fasciotomy    . GALLBLADDER SURGERY  03/31/2018  . KNEE ARTHROSCOPY     left knee 2012  . LAPAROSCOPIC CHOLECYSTECTOMY    . REPLACEMENT TOTAL KNEE BILATERAL Bilateral 09/27/2018  . TONSILLECTOMY     1999  . TOTAL KNEE ARTHROPLASTY Bilateral 09/27/2018   Procedure: TOTAL KNEE BILATERAL;  Surgeon: Frederik Pear, MD;  Location: [facility];  Service: Orthopedics;  Laterality:  Bilateral;  . UPPER GASTROINTESTINAL ENDOSCOPY      There were no vitals filed for this visit.  Subjective Assessment - 10/30/18 0930    Subjective  Pt. reports shins sore from exercises last Friday. No longer needing SPC.    Currently in Pain?  Yes    Pain Score  3     Pain Location  Knee    Pain Orientation  Right;Left    Pain Descriptors / Indicators  Aching    Pain Type  Surgical pain    Pain Radiating Towards  bilat. shins    Pain Onset  More than a month ago    Pain Frequency  Intermittent    Aggravating Factors   activity, walking, exercises    Pain Relieving Factors  rest and ice    Effect of Pain on Daily Activities  limits mobility tolerance         [facility] PT Assessment - 10/30/18 0001      Strength   Right Knee Flexion  5/5    Right Knee Extension  4+/5    Left Knee Flexion  5/5    Left Knee Extension  5/5  [facility] Adult PT Treatment/Exercise - 10/30/18 0001      Exercises   Exercises  Knee/Hip      Knee/Hip Exercises: Stretches   Passive Hamstring Stretch  Both;3 reps;30 seconds      Knee/Hip Exercises: Aerobic   Recumbent Bike  L2x5 min      Knee/Hip Exercises: Standing   Hip Abduction  Both;1 set;10 reps    Abduction Limitations  Red Theraband    Lateral Step Up  Both;20 reps;Step Height: 4"    Lateral Step Up Limitations  side eccentric stepdown    Forward Step Up  Both;20 reps;Step Height: 6"    Functional Squat  20 reps    Functional Squat Limitations  partial squat at parallel bars      Knee/Hip Exercises: Seated   Long Arc Quad  20 reps    Long Arc Quad Weight  2 lbs.    Hamstring Curl  Strengthening;Both;2 sets;10 reps    Hamstring Limitations  Red Theraband      Knee/Hip Exercises: Supine   Short Arc Quad Sets  Both;2 sets;10 reps    Short Arc Quad Sets Limitations  2 lbs.    Bridges  Both;2 sets;10 reps    Bridges Limitations  legs on reversed incline wedge    Straight Leg Raises  Both;2 sets;10 reps     Knee Extension  --   R knee passive ext stretch 10 x 10 sec holds   Knee Extension Limitations  --   able to achieve neutral PROM for extension     Cryotherapy   Number Minutes Cryotherapy  15 Minutes    Cryotherapy Location  Knee   bilat. knees/shins   Type of Cryotherapy  Ice pack             PT Education - 10/30/18 0937    Education Details  HEP, POC    Person(s) Educated  Patient    Methods  Explanation;Demonstration    Comprehension  Verbalized understanding;Returned demonstration       PT Short Term Goals - 10/18/18 1546      PT SHORT TERM GOAL #1   Title  Independent with HEP    Baseline  met with initial HEP    Time  2    Period  Weeks    Status  Achieved      PT SHORT TERM GOAL #2   Title  Increase bilat. knee flexion AROM 5-10 deg to improve ability for transfers from low chairs     Baseline  R 115, L 122    Time  2    Period  Weeks    Status  Achieved        PT Long Term Goals - 10/30/18 1009      PT LONG TERM GOAL #1   Title  Independent with community level ambulation without AD    Baseline  now able without AD    Time  4    Period  Weeks    Status  Achieved      PT LONG TERM GOAL #2   Title  Bilat. 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 knee  flexion AROM 120 deg to improve ability to descend stairs, bend knees for dressing    Baseline  not measured today    Time  4    Period  Weeks    Status  On-going      PT LONG TERM GOAL #4   Title  Right knee extension to neutral for improve heel strike with gait, improved ability upright stance    Baseline  2 deg AROM lacking from neutral    Time  4    Period  Weeks    Status  On-going            Plan - 10/30/18 0939    Clinical Impression Statement  Pt.  continues to progress very well with knee ROM s/p bilat. TKA though still mild lack of extension on right. Primary limitations at this point are associated with weakness and decreased activity tolerance-pt. 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    Clinical Impairments Affecting Rehab Potential  presents with deficits as noted but doing well for timeframe post-op    PT Frequency  3x / week    PT Duration  4 weeks    PT Treatment/Interventions  ADLs/Self Care Home Management;Electrical Stimulation;Functional mobility training;Neuromuscular re-education;Taping;Therapeutic activities;Cryotherapy;Moist Heat;Gait training;Stair training;Balance training;Therapeutic exercise;Patient/family education;Manual techniques;Passive range of motion;Scar mobilization;Vasopneumatic Device    PT Next Visit Plan  progress strengthening as tolerated    PT Home Exercise Plan  quad sets, SAQ, SLR, heel slides vs. seated knee flexion stretch, LAQ, sit>stand vs. partial squat,  gait training    Consulted and Agree with Plan of Care  Patient       Patient will benefit from skilled therapeutic intervention in order to improve the following deficits and impairments:  Abnormal gait, Pain, Decreased mobility, Decreased activity tolerance, Decreased endurance, Decreased range of motion, Decreased strength, Obesity, Decreased balance, Difficulty walking  Visit Diagnosis: Right knee pain, unspecified chronicity  Left knee pain, unspecified chronicity  Muscle weakness (generalized)  Difficulty in walking, not elsewhere classified  Stiffness of right knee, not elsewhere classified  Stiffness of left knee, not elsewhere classified     Problem List Patient Active Problem List   Diagnosis Date Noted  . S/P TKR (total knee replacement), bilateral 09/27/2018  . Bilateral primary osteoarthritis of knee 09/26/2018  . Obesity (BMI 35.0-39.9 without comorbidity) 04/06/2018  .  High triglycerides 04/06/2018  . Chest pain 07/09/2017  . Fibromyalgia 07/09/2017  . Hypertension 07/09/2017    Beaulah Dinning, PT, DPT 10/30/18 10:11 AM  [facility] [address] [place], [place], [address] Phone: [phone]   Fax:  [phone]  Name: Marisha Renier MRN: [id number] Date of Birth: 1966/07/12

## 2018-11-02 ENCOUNTER — Telehealth: Payer: Self-pay | Admitting: *Deleted

## 2018-11-02 ENCOUNTER — Encounter

## 2018-11-02 MED FILL — MELOXICAM 15 MG TABLET: 15 | 90 days supply | Qty: 90 | Fill #2

## 2018-11-02 NOTE — Telephone Encounter (Signed)
Received Home Health Discharge for review from [facility]; forwarded to provider/SLS 12/23

## 2018-11-03 ENCOUNTER — Ambulatory Visit: Payer: 59 | Admitting: Physical Therapy

## 2018-11-06 ENCOUNTER — Encounter: Payer: Self-pay | Admitting: Physical Therapy

## 2018-11-06 ENCOUNTER — Ambulatory Visit: Payer: 59 | Admitting: Physical Therapy

## 2018-11-06 DIAGNOSIS — M25561 Pain in right knee: Secondary | ICD-10-CM

## 2018-11-06 DIAGNOSIS — R262 Difficulty in walking, not elsewhere classified: Secondary | ICD-10-CM

## 2018-11-06 DIAGNOSIS — M25562 Pain in left knee: Secondary | ICD-10-CM

## 2018-11-06 DIAGNOSIS — M6281 Muscle weakness (generalized): Secondary | ICD-10-CM | POA: Diagnosis not present

## 2018-11-06 DIAGNOSIS — M25662 Stiffness of left knee, not elsewhere classified: Secondary | ICD-10-CM

## 2018-11-06 DIAGNOSIS — M25661 Stiffness of right knee, not elsewhere classified: Secondary | ICD-10-CM | POA: Diagnosis not present

## 2018-11-06 NOTE — Therapy (Signed)
[place], [place], [address] Phone: [phone]   Fax:  [phone]  Physical Therapy Treatment  Patient Details  Name: Dawn Reynolds MRN: [id number] Date of Birth: 1966-07-22 Referring Provider (PT): Frederik Pear, MD   Encounter Date: 11/06/2018  PT End of Session - 11/06/18 0846    Visit Number  8    Number of Visits  12    Date for PT Re-Evaluation  11/08/18    Authorization Type  [facility] UMR-no visit limit    PT Start Time  0846    PT Stop Time  0937    PT Time Calculation (min)  51 min    Activity Tolerance  Patient tolerated treatment well       Past Medical History:  Diagnosis Date  . Anemia    hx of  . Arthritis   . 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 Chicken pox   . Depression    hx of in past situational  . Fibromyalgia   . GERD (gastroesophageal reflux disease)   . Headache    hx of migraines  . History of kidney stones    1996  . Hypertension     Past Surgical History:  Procedure Laterality Date  . ABDOMINAL HYSTERECTOMY    . CHOLECYSTECTOMY     03-31-18 [name]  . CHOLECYSTECTOMY N/A 03/31/2018   Procedure: LAPAROSCOPIC CHOLECYSTECTOMY;  Surgeon: Ralene Ok, MD;  Location: [facility];  Service: General;  Laterality: N/A;  . COLONOSCOPY    . endoplantar fasciotomy    . GALLBLADDER SURGERY  03/31/2018  . KNEE ARTHROSCOPY     left knee 2012  . LAPAROSCOPIC CHOLECYSTECTOMY    . REPLACEMENT TOTAL KNEE BILATERAL Bilateral 09/27/2018  . TONSILLECTOMY     1999  . TOTAL KNEE ARTHROPLASTY Bilateral 09/27/2018   Procedure: TOTAL KNEE BILATERAL;  Surgeon: Frederik Pear, MD;  Location: [facility];  Service: Orthopedics;  Laterality: Bilateral;  . UPPER GASTROINTESTINAL ENDOSCOPY      There were no vitals filed for this visit.  Subjective Assessment - 11/06/18 0847    Subjective  Pt reports she is stiff today, traveled 3 hrs in the car over the weekend.     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    Currently in Pain?  No/denies   only having stiffness                      [facility] Adult PT Treatment/Exercise - 11/06/18 0001      Exercises   Exercises  Knee/Hip      Knee/Hip Exercises: Aerobic   Recumbent Bike  L2x5 min      Knee/Hip Exercises: Machines for Strengthening   Cybex Knee Extension  3x10, 3 plates/15#    Cybex Knee Flexion  3x10, 4 plates/20# ftwo sets, 25# last set    Cybex Leg Press  3x10, 3 plates/60#      Knee/Hip Exercises: Standing   SLS  2x10 with FWD bend to mat each side    Other Standing Knee Exercises  15 reps rocker board side/side and FWD/BWD       Knee/Hip Exercises: Supine   Facilities manager;Both   with 10# on hips, 10 reg, 10 with half way move     Knee/Hip Exercises: Sidelying   Other Sidelying Knee/Hip Exercises  10 each pilates FWD/BWD  pulses, CW/CCW circles and then hot potatoe      Cryotherapy   Number Minutes Cryotherapy  12 Minutes    Cryotherapy Location  Knee   bilat   Type of Cryotherapy  Ice pack               PT Short Term Goals - 10/18/18 1546      PT SHORT TERM GOAL #1   Title  Independent with HEP    Baseline  met with initial HEP    Time  2    Period  Weeks    Status  Achieved      PT SHORT TERM GOAL #2   Title  Increase bilat. knee flexion AROM 5-10 deg to improve ability for transfers from low chairs     Baseline  R 115, L 122    Time  2    Period  Weeks    Status  Achieved        PT Long Term Goals - 10/30/18 1009      PT LONG TERM GOAL #1   Title  Independent with community level ambulation without AD    Baseline  now able without AD    Time  4    Period  Weeks    Status  Achieved      PT LONG TERM GOAL #2   Title  Bilat. 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  She is concerned about returning to work and having enough strength to last through her shifts.  Likes to be pushed hard with exercise.     Clinical Impairments Affecting Rehab Potential  --    PT Frequency  3x / week    PT Duration  4 weeks    PT Treatment/Interventions  ADLs/Self Care Home Management;Electrical Stimulation;Functional mobility training;Neuromuscular re-education;Taping;Therapeutic activities;Cryotherapy;Moist Heat;Gait training;Stair training;Balance training;Therapeutic exercise;Patient/family education;Manual techniques;Passive range of motion;Scar mobilization;Vasopneumatic Device    PT Next Visit Plan  pt requests to have challenging exercise with therapy.        Patient will benefit from skilled therapeutic intervention in order to improve the following deficits and impairments:  Abnormal gait, Pain, Decreased mobility, Decreased activity tolerance, Decreased endurance, Decreased range of motion, Decreased strength, Obesity, Decreased balance, Difficulty walking  Visit Diagnosis: Right knee pain, unspecified chronicity  Left knee pain, unspecified chronicity  Muscle weakness (generalized)  Difficulty in walking, not  elsewhere classified  Stiffness of right knee, not elsewhere classified  Stiffness of left  knee, not elsewhere classified     Problem List Patient Active Problem List   Diagnosis Date Noted  . S/P TKR (total knee replacement), bilateral 09/27/2018  . Bilateral primary osteoarthritis of knee 09/26/2018  . Obesity (BMI 35.0-39.9 without comorbidity) 04/06/2018  . High triglycerides 04/06/2018  . Chest pain 07/09/2017  . Fibromyalgia 07/09/2017  . Hypertension 07/09/2017    Dawn Reynolds PT  11/06/2018, 9:30 AM  [facility] [address] [place], [place], [address] Phone: [phone]   Fax:  [phone]  Name: Dawn Reynolds MRN: [id number] Date of Birth: 06-26-1966

## 2018-11-10 ENCOUNTER — Encounter: Payer: Self-pay | Admitting: Physical Therapy

## 2018-11-10 ENCOUNTER — Ambulatory Visit: Payer: 59 | Attending: Orthopedic Surgery | Admitting: Physical Therapy

## 2018-11-10 DIAGNOSIS — M6281 Muscle weakness (generalized): Secondary | ICD-10-CM | POA: Diagnosis not present

## 2018-11-10 DIAGNOSIS — R262 Difficulty in walking, not elsewhere classified: Secondary | ICD-10-CM | POA: Diagnosis not present

## 2018-11-10 DIAGNOSIS — M545 Low back pain: Secondary | ICD-10-CM | POA: Insufficient documentation

## 2018-11-10 DIAGNOSIS — M25561 Pain in right knee: Secondary | ICD-10-CM | POA: Insufficient documentation

## 2018-11-10 DIAGNOSIS — M25662 Stiffness of left knee, not elsewhere classified: Secondary | ICD-10-CM | POA: Diagnosis not present

## 2018-11-10 DIAGNOSIS — M9903 Segmental and somatic dysfunction of lumbar region: Secondary | ICD-10-CM | POA: Insufficient documentation

## 2018-11-10 DIAGNOSIS — M25661 Stiffness of right knee, not elsewhere classified: Secondary | ICD-10-CM | POA: Diagnosis not present

## 2018-11-10 DIAGNOSIS — M25562 Pain in left knee: Secondary | ICD-10-CM | POA: Diagnosis not present

## 2018-11-10 NOTE — Therapy (Signed)
[place], [place], [address] Phone: [phone]   Fax:  [phone]  Physical Therapy Treatment / Re-certification  Patient Details  Name: Dawn Reynolds MRN: [id number] Date of Birth: [birth year] Referring Provider (PT): Frederik Pear, MD   Encounter Date: 11/10/2018  PT End of Session - 11/10/18 0933    Visit Number  9    Number of Visits  12    Date for PT Re-Evaluation  12/08/18    PT Start Time  0846    PT Stop Time  0929    PT Time Calculation (min)  43 min    Activity Tolerance  Patient tolerated treatment well    Behavior During Therapy  [facility] for tasks assessed/performed       Past Medical History:  Diagnosis Date  . Anemia    hx of  . Arthritis   . 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 Chicken pox   . Depression    hx of in past situational  . Fibromyalgia   . GERD (gastroesophageal reflux disease)   . Headache    hx of migraines  . History of kidney stones    1996  . Hypertension     Past Surgical History:  Procedure Laterality Date  . ABDOMINAL HYSTERECTOMY    . CHOLECYSTECTOMY     03-31-18 [name]  . CHOLECYSTECTOMY N/A 03/31/2018   Procedure: LAPAROSCOPIC CHOLECYSTECTOMY;  Surgeon: Ralene Ok, MD;  Location: [facility];  Service: General;  Laterality: N/A;  . COLONOSCOPY    . endoplantar fasciotomy    . GALLBLADDER SURGERY  03/31/2018  . KNEE ARTHROSCOPY     left knee 2012  . LAPAROSCOPIC CHOLECYSTECTOMY    . REPLACEMENT TOTAL KNEE BILATERAL Bilateral 09/27/2018  . TONSILLECTOMY     1999  . TOTAL KNEE ARTHROPLASTY Bilateral 09/27/2018   Procedure: TOTAL KNEE BILATERAL;  Surgeon: Frederik Pear, MD;  [facility];  Service: Orthopedics;  Laterality: Bilateral;  . UPPER GASTROINTESTINAL ENDOSCOPY      There were no vitals filed for this visit.  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    Currently in Pain?  Yes    Pain Score  2     Pain Orientation  Right;Left    Pain Descriptors / Indicators  Aching    Pain Type  Chronic pain    Pain Onset  More than a month ago    Pain Frequency  Intermittent    Aggravating Factors   steps and curbs    Pain Relieving Factors  rest, and ice         [facility] PT Assessment - 11/10/18 0001      Assessment   Medical Diagnosis  s/p bilateral TKA    Referring Provider (PT)  Frederik Pear, MD    Onset Date/Surgical Date  09/27/18      Observation/Other Assessments   Focus on Therapeutic Outcomes (FOTO)   53% limited      AROM   Right Knee Flexion  124    Left Knee Flexion  130      Strength   Right Knee Flexion  4+/5    Right Knee Extension  5/5    Left Knee Flexion  4+/5    Left Knee Extension  5/5  [place] Adult PT Treatment/Exercise - 11/10/18 0001      Knee/Hip Exercises: Aerobic   Recumbent Bike  L4 x 3 min    following manual for retraining     Knee/Hip Exercises: Standing   Forward Lunges  1 set;Both;5 reps   touching down onto Bosu   Forward Lunges Limitations  using table intermittently for balance    Gait Training  in // forward and retro walking with exaggerated heel strike/ toe off,    mirror for visual cues     Knee/Hip Exercises: Seated   Sit to Sand  1 set;15 reps;without UE support   cues for proper form     Manual Therapy   Manual therapy comments  MTPR along bil distal quad and anterior tibialis    Soft tissue mobilization  DTM along the quads with roller      Ankle Exercises: Stretches   Other Stretch  tibialis anterior stretching 1 x 30 sec bil               PT Short Term Goals - 10/18/18 1546      PT SHORT TERM GOAL #1   Title  Independent with HEP    Baseline  met with initial HEP    Time  2    Period  Weeks    Status  Achieved      PT SHORT TERM GOAL #2   Title  Increase bilat. knee  flexion AROM 5-10 deg to improve ability for transfers from low chairs     Baseline  R 115, L 122    Time  2    Period  Weeks    Status  Achieved        PT Long Term Goals - 11/10/18 3532      PT LONG TERM GOAL #1   Title  Independent with community level ambulation without AD    Time  4    Period  Weeks    Status  Achieved      PT LONG TERM GOAL #2   Title  Bilat. 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 knee flexion AROM 120 deg to improve ability to descend stairs, bend knees for dressing    Time  4    Period  Weeks    Status  Achieved      PT LONG TERM GOAL #4   Title  Right knee extension to neutral for improve heel strike with gait, improved ability upright stance    Time  4    Period  Weeks    Status  On-going            Plan - 11/10/18 0936    Clinical Impression Statement  Mrs poling demonstrats functional knee ROM bil and noted only mild weakness during hamstring assessment due to pain noted along the tibialis anterior bil. she is progressing appropriatley toward goals and returns to work next week. utilized STW along [employer] and anterior tibs with noted trigger points. plan to continued with current POC to work on strengthening and fucntional activities.     PT Frequency  3x / week    PT Duration  3 weeks    PT Treatment/Interventions  ADLs/Self Care Home Management;Electrical Stimulation;Functional mobility training;Neuromuscular re-education;Taping;Therapeutic activities;Cryotherapy;Moist Heat;Gait training;Stair training;Balance training;Therapeutic exercise;Patient/family education;Manual techniques;Passive range of motion;Scar mobilization;Vasopneumatic Device    PT Next Visit Plan  pt requests to have challenging exercise with therapy.  STW along quads and anterior tib    PT Home Exercise Plan  quad sets, SAQ, SLR, heel slides vs. seated knee flexion stretch, LAQ, sit>stand vs. partial squat,  gait training    Consulted and Agree with Plan of Care  Patient       Patient will benefit from skilled therapeutic intervention in order to improve the following deficits and impairments:  Abnormal gait, Pain, Decreased mobility, Decreased activity tolerance, Decreased endurance, Decreased range of motion, Decreased strength, Obesity, Decreased balance, Difficulty walking  Visit Diagnosis: Right knee pain, unspecified chronicity  Left knee pain, unspecified chronicity  Muscle weakness (generalized)  Difficulty in walking, not elsewhere classified  Stiffness of right knee, not elsewhere classified     Problem List Patient Active Problem List   Diagnosis Date Noted  . S/P TKR (total knee replacement), bilateral 09/27/2018  . Bilateral primary osteoarthritis of knee 09/26/2018  . Obesity (BMI 35.0-39.9 without comorbidity) 04/06/2018  . High triglycerides 04/06/2018  . Chest pain 07/09/2017  . Fibromyalgia 07/09/2017  . Hypertension 07/09/2017   Starr Lake PT, DPT, LAT, ATC  11/10/18  9:43 AM      [facility] [place] [address] [place], [place], [address] Phone: [phone]   Fax:  [phone]  Name: Dawn Reynolds MRN: [id number] Date of Birth: [birth year]

## 2018-11-13 ENCOUNTER — Encounter: Payer: Self-pay | Admitting: Physical Therapy

## 2018-11-13 ENCOUNTER — Ambulatory Visit: Payer: 59 | Admitting: Physical Therapy

## 2018-11-13 DIAGNOSIS — R262 Difficulty in walking, not elsewhere classified: Secondary | ICD-10-CM

## 2018-11-13 DIAGNOSIS — M25662 Stiffness of left knee, not elsewhere classified: Secondary | ICD-10-CM

## 2018-11-13 DIAGNOSIS — M545 Low back pain, unspecified: Secondary | ICD-10-CM

## 2018-11-13 DIAGNOSIS — M9903 Segmental and somatic dysfunction of lumbar region: Secondary | ICD-10-CM

## 2018-11-13 DIAGNOSIS — M25562 Pain in left knee: Secondary | ICD-10-CM

## 2018-11-13 DIAGNOSIS — M25561 Pain in right knee: Secondary | ICD-10-CM

## 2018-11-13 DIAGNOSIS — M25661 Stiffness of right knee, not elsewhere classified: Secondary | ICD-10-CM

## 2018-11-13 DIAGNOSIS — M6281 Muscle weakness (generalized): Secondary | ICD-10-CM | POA: Diagnosis not present

## 2018-11-13 NOTE — Therapy (Signed)
[place], [place], [address] Phone: [phone]   Fax:  [phone]  Physical Therapy Treatment  Patient Details  Name: Dawn Reynolds MRN: [id number] Date of Birth: [birth year] Referring Provider (PT): Frederik Pear, MD   Encounter Date: 11/13/2018  PT End of Session - 11/13/18 1313    Visit Number  10    Number of Visits  12    Date for PT Re-Evaluation  12/08/18    PT Start Time  1102    PT Stop Time  1200    PT Time Calculation (min)  58 min    Activity Tolerance  Patient tolerated treatment well    Behavior During Therapy  [facility] for tasks assessed/performed       Past Medical History:  Diagnosis Date  . Anemia    hx of  . Arthritis   . 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 Chicken pox   . Depression    hx of in past situational  . Fibromyalgia   . GERD (gastroesophageal reflux disease)   . Headache    hx of migraines  . History of kidney stones    1996  . Hypertension     Past Surgical History:  Procedure Laterality Date  . ABDOMINAL HYSTERECTOMY    . CHOLECYSTECTOMY     03-31-18 [name]  . CHOLECYSTECTOMY N/A 03/31/2018   Procedure: LAPAROSCOPIC CHOLECYSTECTOMY;  Surgeon: Ralene Ok, MD;  Location: [facility];  Service: General;  Laterality: N/A;  . COLONOSCOPY    . endoplantar fasciotomy    . GALLBLADDER SURGERY  03/31/2018  . KNEE ARTHROSCOPY     left knee 2012  . LAPAROSCOPIC CHOLECYSTECTOMY    . REPLACEMENT TOTAL KNEE BILATERAL Bilateral 09/27/2018  . TONSILLECTOMY     1999  . TOTAL KNEE ARTHROPLASTY Bilateral 09/27/2018   Procedure: TOTAL KNEE BILATERAL;  Surgeon: Frederik Pear, MD;  Location: [facility];  Service: Orthopedics;  Laterality: Bilateral;  . UPPER GASTROINTESTINAL ENDOSCOPY      There were no vitals filed for this visit.  Subjective Assessment - 11/13/18 1108    Subjective  No Neurotin or tramedol now.   Vania Rea worked on trigger points in shin and it is gone.      Currently in Pain?  Yes    Pain Score  2     Pain Location  Knee    Pain Orientation  Right;Left    Pain Descriptors / Indicators  Aching    Pain Type  Chronic pain    Pain Radiating Towards  no    Aggravating Factors   steps and curbs    Pain Relieving Factors  trigger point r;lease    Effect of Pain on Daily Activities  limits mobility         [facility] PT Assessment - 11/13/18 0001      AROM   Right Knee Flexion  128   supine   Left Knee Flexion  133                   [facility] Adult PT Treatment/Exercise - 11/13/18 0001      Self-Care   Self-Care  Other Self-Care Comments    Other Self-Care Comments   discussed stratigies for return to work  1/2 days,  shorter days,  every other day,  ect,       Lumbar Exercises: Standing   Other Standing Lumbar Exercises  kneel to BOSU at counter  2 positions  Knee/Hip Exercises: Stretches   Passive Hamstring Stretch  Both;1 rep;30 seconds    Passive Hamstring Stretch Limitations  not tight      Knee/Hip Exercises: Standing   Lateral Step Up  Both;10 reps;Hand Hold: 1;Step Height: 4"   not touching the floor.   Forward Step Up  Right;1 set;10 reps;Hand Hold: 2;Hand Hold: 0;Step Height: 8";Left    Functional Squat  10 reps;1 set;10 seconds    Functional Squat Limitations  cued  monitored neutral spine, however back pain noted.     Other Standing Knee Exercises  SLS cone touch on 4 inch platform    challanging balance and for her core      Knee/Hip Exercises: Supine   Bridges  10 reps    Bridges Limitations  feels it in left hip    Straight Leg Raises  Both;10 reps    Straight Leg Raises Limitations    challanging    with abdominals   for benifit of back issues   Other Supine Knee/Hip Exercises  ball squeeze  with abdominal      Knee/Hip Exercises: Sidelying   Clams  10 X 2 sets pilates style.  both      Cryotherapy   Number Minutes Cryotherapy  15 Minutes    Cryotherapy Location  Knee   both   Type of Cryotherapy   --   cold pack     Ankle Exercises: Aerobic   Recumbent Bike  5 minutes  Level 2 to level 4              PT Education - 11/13/18 1312    Education Details  Exercise form,  self care    Person(s) Educated  Patient    Methods  Explanation    Comprehension  Verbalized understanding       PT Short Term Goals - 10/18/18 1546      PT SHORT TERM GOAL #1   Title  Independent with HEP    Baseline  met with initial HEP    Time  2    Period  Weeks    Status  Achieved      PT SHORT TERM GOAL #2   Title  Increase bilat. knee flexion AROM 5-10 deg to improve ability for transfers from low chairs     Baseline  R 115, L 122    Time  2    Period  Weeks    Status  Achieved        PT Long Term Goals - 11/13/18 1316      PT LONG TERM GOAL #1   Title  Independent with community level ambulation without AD    Time  4    Period  Weeks    Status  Achieved      PT LONG TERM GOAL #2   Title  Bilat. 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 knee flexion AROM 120 deg to improve ability to descend stairs, bend knees for dressing    Time  4    Period  Weeks    Status  Achieved      PT LONG TERM GOAL #4   Title  Right knee extension to neutral for improve heel strike  with gait, improved ability upright stance    Baseline  visually not quite neutral    Time  4    Period  Weeks    Status  On-going            Plan - 11/13/18 1314    Clinical Impression Statement  ROM flecion continues to improve.  As challanges are increasing, back issues are resurfacing.  Core work Primary school teacher.  Patient hopes to return to work soon.   2-3/10 pain at end of session.  Shin pain resolved    PT Next Visit Plan  pt requests to have challenging exercise with therapy. STW along quads and anterior tib    PT Home Exercise  Plan  quad sets, SAQ, SLR, heel slides vs. seated knee flexion stretch, LAQ, sit>stand vs. partial squat,  gait training    Consulted and Agree with Plan of Care  Patient       Patient will benefit from skilled therapeutic intervention in order to improve the following deficits and impairments:     Visit Diagnosis: Right knee pain, unspecified chronicity  Left knee pain, unspecified chronicity  Muscle weakness (generalized)  Stiffness of right knee, not elsewhere classified  Difficulty in walking, not elsewhere classified  Stiffness of left knee, not elsewhere classified  Difficulty walking  Lumbosacral dysfunction  Acute left-sided low back pain without sciatica     Problem List Patient Active Problem List   Diagnosis Date Noted  . S/P TKR (total knee replacement), bilateral 09/27/2018  . Bilateral primary osteoarthritis of knee 09/26/2018  . Obesity (BMI 35.0-39.9 without comorbidity) 04/06/2018  . High triglycerides 04/06/2018  . Chest pain 07/09/2017  . Fibromyalgia 07/09/2017  . Hypertension 07/09/2017    Nakiah Osgood PTA 11/13/2018, 1:18 PM  [facility] [address] [place], [place], [address] Phone: [phone]   Fax:  [phone]  Name: Dawn Reynolds MRN: [id number] Date of Birth: 1966/02/09

## 2018-11-15 ENCOUNTER — Encounter: Payer: Self-pay | Admitting: Physical Therapy

## 2018-11-15 ENCOUNTER — Ambulatory Visit: Payer: 59 | Admitting: Physical Therapy

## 2018-11-15 DIAGNOSIS — M25661 Stiffness of right knee, not elsewhere classified: Secondary | ICD-10-CM

## 2018-11-15 DIAGNOSIS — M25562 Pain in left knee: Secondary | ICD-10-CM

## 2018-11-15 DIAGNOSIS — R262 Difficulty in walking, not elsewhere classified: Secondary | ICD-10-CM

## 2018-11-15 DIAGNOSIS — M6281 Muscle weakness (generalized): Secondary | ICD-10-CM | POA: Diagnosis not present

## 2018-11-15 DIAGNOSIS — M9903 Segmental and somatic dysfunction of lumbar region: Secondary | ICD-10-CM | POA: Diagnosis not present

## 2018-11-15 DIAGNOSIS — M25561 Pain in right knee: Secondary | ICD-10-CM | POA: Diagnosis not present

## 2018-11-15 DIAGNOSIS — M545 Low back pain: Secondary | ICD-10-CM | POA: Diagnosis not present

## 2018-11-15 DIAGNOSIS — M25662 Stiffness of left knee, not elsewhere classified: Secondary | ICD-10-CM | POA: Diagnosis not present

## 2018-11-15 NOTE — Therapy (Signed)
[place], [place], [address] Phone: [phone]   Fax:  [phone]  Physical Therapy Treatment  Patient Details  Name: Dawn Reynolds MRN: [id number] Date of Birth: 05-05-1966 Referring Provider (PT): Frederik Pear, MD   Encounter Date: 11/15/2018  PT End of Session - 11/15/18 1105    Visit Number  11    Number of Visits  18   per recert at visit 9 for 3x/week x 3 weeks   Date for PT Re-Evaluation  12/08/18    Authorization Type  [facility] UMR-no visit limit    PT Start Time  1103    PT Stop Time  1154    PT Time Calculation (min)  51 min    Activity Tolerance  Patient tolerated treatment well    Behavior During Therapy  [facility] for tasks assessed/performed       Past Medical History:  Diagnosis Date  . Anemia    hx of  . Arthritis   . 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 Chicken pox   . Depression    hx of in past situational  . Fibromyalgia   . GERD (gastroesophageal reflux disease)   . Headache    hx of migraines  . History of kidney stones    1996  . Hypertension     Past Surgical History:  Procedure Laterality Date  . ABDOMINAL HYSTERECTOMY    . CHOLECYSTECTOMY     03-31-18 [name]  . CHOLECYSTECTOMY N/A 03/31/2018   Procedure: LAPAROSCOPIC CHOLECYSTECTOMY;  Surgeon: Ralene Ok, MD;  Location: [facility];  Service: General;  Laterality: N/A;  . COLONOSCOPY    . endoplantar fasciotomy    . GALLBLADDER SURGERY  03/31/2018  . KNEE ARTHROSCOPY     left knee 2012  . LAPAROSCOPIC CHOLECYSTECTOMY    . REPLACEMENT TOTAL KNEE BILATERAL Bilateral 09/27/2018  . TONSILLECTOMY     1999  . TOTAL KNEE ARTHROPLASTY Bilateral 09/27/2018   Procedure: TOTAL KNEE BILATERAL;  Surgeon: Frederik Pear, MD;  Location: [facility];  Service: Orthopedics;  Laterality: Bilateral;  . UPPER GASTROINTESTINAL ENDOSCOPY      There were no vitals filed for this visit.  Subjective Assessment - 11/15/18 1106    Subjective   Pt. sees Dr. Mayer Camel tomorrow-she reports feels ready to try to return to work pending MD clearance. She is scheduled for therapy through the end of next week and will see how things go with work status.                       [facility] Adult PT Treatment/Exercise - 11/15/18 0001      Knee/Hip Exercises: Aerobic   Recumbent Bike  L4 x 5 min      Knee/Hip Exercises: Machines for Strengthening   Cybex Knee Extension  3x10, 3 plates,15 lb.    Cybex Knee Flexion  3x10, 4 plates, 20 lbs.    Cybex Leg Press  3x10 3 plates/60 lbs.      Knee/Hip Exercises: Standing   Forward Lunges  Both;1 set;10 reps    Forward Lunges Limitations  1 UE on parallel bar with back knee touch to blue side BOSU    Lateral Step Up  Both;15 reps;Step Height: 6"    Lateral Step Up Limitations  step up over alt. sides x 15 ea. hands at parallel bars    Forward Step Up  Both;1 set;10 reps;Hand Hold: 1;Step Height: 8"    Forward  Step Up Limitations  with opp hip hike    Step Down  Both;1 set;10 reps;Step Height: 4"    Functional Squat  1 set;15 reps    Rocker Board  --   side to side blue board, fw/rev wood board 1 min ea.   SLS  1x5 for 10 sec holds ea. bilat. on Airex      Knee/Hip Exercises: Supine   Heel Slides  --   x 10 ea. bilat. with overpressure for knee flexion ROM   Bridges  Both;2 sets;10 reps    Bridges Limitations  legs on bolster      Cryotherapy   Number Minutes Cryotherapy  10 Minutes    Cryotherapy Location  Knee   bilat.   Type of Cryotherapy  --   cold pack     Ankle Exercises: Stretches   Slant Board Stretch  3 reps;30 seconds               PT Short Term Goals - 10/18/18 1546      PT SHORT TERM GOAL #1   Title  Independent with HEP    Baseline  met with initial HEP    Time  2    Period  Weeks    Status  Achieved      PT SHORT TERM GOAL #2   Title  Increase bilat. knee flexion AROM 5-10 deg to improve ability for transfers from low chairs     Baseline  R 115,  L 122    Time  2    Period  Weeks    Status  Achieved        PT Long Term Goals - 11/13/18 1316      PT LONG TERM GOAL #1   Title  Independent with community level ambulation without AD    Time  4    Period  Weeks    Status  Achieved      PT LONG TERM GOAL #2   Title  Bilat. 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 knee flexion AROM 120 deg to improve ability to descend stairs, bend knees for dressing    Time  4    Period  Weeks    Status  Achieved      PT LONG TERM GOAL #4   Title  Right knee extension to neutral for improve heel strike with gait, improved ability upright stance    Baseline  visually not quite neutral    Time  4    Period  Weeks    Status  On-going            Plan - 11/15/18 1121    Clinical Impression Statement  Pt. continues to improve with bilat. knee ROM and strength gains. 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    PT Frequency  3x / week    PT Duration  3 weeks    PT Treatment/Interventions  ADLs/Self Care Home Management;Electrical Stimulation;Functional mobility training;Neuromuscular re-education;Taping;Therapeutic activities;Cryotherapy;Moist Heat;Gait training;Stair training;Balance training;Therapeutic exercise;Patient/family education;Manual techniques;Passive range of motion;Scar mobilization;Vasopneumatic Device    PT Next Visit Plan  Continue focus strengthening, closed chain activities, balance, functional activity tolerance.    PT Home Exercise Plan  quad sets, SAQ, SLR, heel slides vs. seated knee flexion stretch, LAQ, sit>stand vs. partial squat,  gait training    Consulted and Agree with Plan of Care  Patient        Patient will benefit from skilled therapeutic intervention in order to improve the following deficits and impairments:  Abnormal gait, Pain, Decreased mobility, Decreased activity tolerance, Decreased endurance, Decreased range of motion, Decreased strength, Obesity, Decreased balance, Difficulty walking  Visit Diagnosis: Right knee pain, unspecified chronicity  Left knee pain, unspecified chronicity  Muscle weakness (generalized)  Stiffness of right knee, not elsewhere classified  Stiffness of left knee, not elsewhere classified  Difficulty in walking, not elsewhere classified     Problem List Patient Active Problem List   Diagnosis Date Noted  . S/P TKR (total knee replacement), bilateral 09/27/2018  . Bilateral primary osteoarthritis of knee 09/26/2018  . Obesity (BMI 35.0-39.9 without comorbidity) 04/06/2018  . High triglycerides 04/06/2018  . Chest pain 07/09/2017  . Fibromyalgia 07/09/2017  . Hypertension 07/09/2017    Beaulah Dinning, PT, DPT 11/15/18 11:46 AM  [facility] [address] [place], [place], [address] Phone: [phone]   Fax:  [phone]  Name: Dawn Reynolds MRN: [id number] Date of Birth: 1966-03-28

## 2018-11-16 DIAGNOSIS — Z96651 Presence of right artificial knee joint: Secondary | ICD-10-CM | POA: Diagnosis not present

## 2018-11-16 DIAGNOSIS — Z96652 Presence of left artificial knee joint: Secondary | ICD-10-CM | POA: Diagnosis not present

## 2018-11-17 ENCOUNTER — Ambulatory Visit: Payer: 59 | Admitting: Physical Therapy

## 2018-11-20 ENCOUNTER — Ambulatory Visit: Payer: 59 | Admitting: Physical Therapy

## 2018-11-20 ENCOUNTER — Encounter: Payer: Self-pay | Admitting: Physical Therapy

## 2018-11-20 DIAGNOSIS — M25561 Pain in right knee: Secondary | ICD-10-CM | POA: Diagnosis not present

## 2018-11-20 DIAGNOSIS — M25662 Stiffness of left knee, not elsewhere classified: Secondary | ICD-10-CM | POA: Diagnosis not present

## 2018-11-20 DIAGNOSIS — M545 Low back pain: Secondary | ICD-10-CM | POA: Diagnosis not present

## 2018-11-20 DIAGNOSIS — M25661 Stiffness of right knee, not elsewhere classified: Secondary | ICD-10-CM

## 2018-11-20 DIAGNOSIS — R262 Difficulty in walking, not elsewhere classified: Secondary | ICD-10-CM | POA: Diagnosis not present

## 2018-11-20 DIAGNOSIS — M25562 Pain in left knee: Secondary | ICD-10-CM | POA: Diagnosis not present

## 2018-11-20 DIAGNOSIS — M9903 Segmental and somatic dysfunction of lumbar region: Secondary | ICD-10-CM | POA: Diagnosis not present

## 2018-11-20 DIAGNOSIS — M6281 Muscle weakness (generalized): Secondary | ICD-10-CM

## 2018-11-20 NOTE — Therapy (Signed)
[place], [place], [address] Phone: [phone]   Fax:  [phone]  Physical Therapy Treatment  Patient Details  Name: Dawn Reynolds MRN: [id number] Date of Birth: [birth year] Referring Provider (PT): Frederik Pear, MD   Encounter Date: 11/20/2018  PT End of Session - 11/20/18 1234    Visit Number  12    Number of Visits  18    Date for PT Re-Evaluation  12/08/18    Authorization Type  [facility] UMR-no visit limit    PT Start Time  6962    PT Stop Time  1242    PT Time Calculation (min)  54 min    Activity Tolerance  Patient tolerated treatment well    Behavior During Therapy  [facility] [place] [facility] for tasks assessed/performed       Past Medical History:  Diagnosis Date  . Anemia    hx of  . Arthritis   . 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 Chicken pox   . Depression    hx of in past situational  . Fibromyalgia   . GERD (gastroesophageal reflux disease)   . Headache    hx of migraines  . History of kidney stones    1996  . Hypertension     Past Surgical History:  Procedure Laterality Date  . ABDOMINAL HYSTERECTOMY    . CHOLECYSTECTOMY     03-31-18 [name]  . CHOLECYSTECTOMY N/A 03/31/2018   Procedure: LAPAROSCOPIC CHOLECYSTECTOMY;  Surgeon: Ralene Ok, MD;  Location: [facility];  Service: General;  Laterality: N/A;  . COLONOSCOPY    . endoplantar fasciotomy    . GALLBLADDER SURGERY  03/31/2018  . KNEE ARTHROSCOPY     left knee 2012  . LAPAROSCOPIC CHOLECYSTECTOMY    . REPLACEMENT TOTAL KNEE BILATERAL Bilateral 09/27/2018  . TONSILLECTOMY     1999  . TOTAL KNEE ARTHROPLASTY Bilateral 09/27/2018   Procedure: TOTAL KNEE BILATERAL;  Surgeon: Frederik Pear, MD;  Location: [facility];  Service: Orthopedics;  Laterality: Bilateral;  . UPPER GASTROINTESTINAL ENDOSCOPY      There were no vitals filed for this visit.  Subjective Assessment - 11/20/18 1149    Subjective  Pt. reports cancelled visit last Friday due  to soreness after last tx. She returns to work the evening of 11/22/18 with restrictions not to work consecutive days otherwise will be full duty/12 hour shifts. Still noting some difficulty with stairs.    Currently in Pain?  Yes    Pain Score  2     Pain Location  Knee    Pain Orientation  Right;Left    Pain Descriptors / Indicators  Aching;Dull    Pain Type  Surgical pain    Pain Onset  More than a month ago    Pain Frequency  Constant    Aggravating Factors   activity    Pain Relieving Factors  rest    Effect of Pain on Daily Activities  limits mobility tolerance         [facility] PT Assessment - 11/20/18 0001      AROM   Right Knee Extension  0    Right Knee Flexion  130    Left Knee Extension  0    Left Knee Flexion  125                   [facility] Adult PT Treatment/Exercise - 11/20/18 0001      Knee/Hip Exercises: Aerobic   Recumbent Bike  L4x5 min      Knee/Hip Exercises: Standing   Heel Raises  20 reps    Heel Raises Limitations  on Airex    Lateral Step Up  Both;1 set;10 reps;Hand Hold: 2;Step Height: 4"    Forward Step Up  Both;1 set;15 reps;Hand Hold: 1;Step Height: 6"    Forward Step Up Limitations  with opp hip hike    Functional Squat  2 sets;10 reps    Functional Squat Limitations  TRX squat    Rocker Board  --   blue board x 1 min, wooden board fw/ rev x 1 min   SLS  alt slow "march" on Airex for SLS x 20    Other Standing Knee Exercises  Romberg on Airex EC x 20 sec      Knee/Hip Exercises: Seated   Long Arc Quad  Strengthening;Right;Left;20 reps    Long Arc Quad Weight  2 lbs.      Knee/Hip Exercises: Supine   Short Arc Quad Sets  AROM;Strengthening;Both;2 sets;10 reps    Short Arc Quad Sets Limitations  2 lbs.    Heel Slides  --   with therapist overpressure x 15 ea. bilat.   Bridges  Both;2 sets;10 reps    Bridges Limitations  legs on bolster      Cryotherapy   Number Minutes Cryotherapy  15 Minutes    Cryotherapy Location  Knee     Type of Cryotherapy  --   cold gel pack bilat.            PT Education - 11/20/18 1233    Education Details  POC    Person(s) Educated  Patient    Methods  Explanation    Comprehension  Verbalized understanding       PT Short Term Goals - 11/20/18 1249      PT SHORT TERM GOAL #1   Title  Independent with HEP    Baseline  met with initial HEP    Time  2    Period  Weeks    Status  Achieved      PT SHORT TERM GOAL #2   Title  Increase bilat. knee flexion AROM 5-10 deg to improve ability for transfers from low chairs     Baseline  Rt 130, Lt 125    Time  2    Period  Weeks    Status  Achieved        PT Long Term Goals - 11/20/18 1250      PT LONG TERM GOAL #1   Title  Independent with community level ambulation without AD    Baseline  now able without AD    Time  4    Period  Weeks    Status  Achieved      PT LONG TERM GOAL #2   Title  Bilat. 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 knee flexion AROM 120 deg to improve ability to descend stairs, bend knees for dressing    Baseline  Rt 130 deg, Lt 125 deg    Time  4    Period  Weeks  Status  Achieved      PT LONG TERM GOAL #4   Title  Right knee extension to neutral for improve heel strike with gait, improved ability upright stance    Baseline  met, able to achive neutral    Time  4    Period  Weeks    Status  On-going            Plan - 11/20/18 1234    Clinical Impression Statement  Decreased exercise intensity today compared to last session due to excessive soreness after last tx. which suspect was associated with combination delayed onset muscle soreness and fibryomyalgia. Pt. does continue to progress otherwise and doing well with knee  ROM, primary remaining functional limitations are associated with muscle weakness evident with decreased eccentric control with step down motions as well as decreased proprioception/balance and overall deconditioning with decreased activity tolerance. Plan continue PT per POC from last recertification and pending status on abilities with return to work later this week.    Rehab Potential  Excellent    PT Frequency  3x / week    PT Duration  3 weeks    PT Treatment/Interventions  ADLs/Self Care Home Management;Electrical Stimulation;Functional mobility training;Neuromuscular re-education;Taping;Therapeutic activities;Cryotherapy;Moist Heat;Gait training;Stair training;Balance training;Therapeutic exercise;Patient/family education;Manual techniques;Passive range of motion;Scar mobilization;Vasopneumatic Device    PT Next Visit Plan  Monitor soreness, continue focus strengthening, closed chain activities, balance, functional activity tolerance.    PT Home Exercise Plan  quad sets, SAQ, SLR, heel slides vs. seated knee flexion stretch, LAQ, sit>stand vs. partial squat,  gait training    Consulted and Agree with Plan of Care  Patient       Patient will benefit from skilled therapeutic intervention in order to improve the following deficits and impairments:  Abnormal gait, Pain, Decreased mobility, Decreased activity tolerance, Decreased endurance, Decreased range of motion, Decreased strength, Obesity, Decreased balance, Difficulty walking  Visit Diagnosis: Right knee pain, unspecified chronicity  Left knee pain, unspecified chronicity  Muscle weakness (generalized)  Stiffness of right knee, not elsewhere classified  Stiffness of left knee, not elsewhere classified  Difficulty in walking, not elsewhere classified     Problem List Patient Active Problem List   Diagnosis Date Noted  . S/P TKR (total knee replacement), bilateral 09/27/2018  . Bilateral primary osteoarthritis of knee  09/26/2018  . Obesity (BMI 35.0-39.9 without comorbidity) 04/06/2018  . High triglycerides 04/06/2018  . Chest pain 07/09/2017  . Fibromyalgia 07/09/2017  . Hypertension 07/09/2017    Beaulah Dinning, PT, DPT 11/20/18 12:55 PM  [facility] [address] [place], [place], [address] Phone: [phone]   Fax:  [phone]  Name: Dawn Reynolds MRN: [id number] Date of Birth: Oct 24, 1966

## 2018-11-22 ENCOUNTER — Encounter: Payer: Self-pay | Admitting: Physical Therapy

## 2018-11-22 ENCOUNTER — Ambulatory Visit: Payer: 59 | Admitting: Physical Therapy

## 2018-11-22 DIAGNOSIS — M25661 Stiffness of right knee, not elsewhere classified: Secondary | ICD-10-CM

## 2018-11-22 DIAGNOSIS — R262 Difficulty in walking, not elsewhere classified: Secondary | ICD-10-CM | POA: Diagnosis not present

## 2018-11-22 DIAGNOSIS — M6281 Muscle weakness (generalized): Secondary | ICD-10-CM | POA: Diagnosis not present

## 2018-11-22 DIAGNOSIS — M25662 Stiffness of left knee, not elsewhere classified: Secondary | ICD-10-CM

## 2018-11-22 DIAGNOSIS — M25561 Pain in right knee: Secondary | ICD-10-CM

## 2018-11-22 DIAGNOSIS — M25562 Pain in left knee: Secondary | ICD-10-CM

## 2018-11-22 DIAGNOSIS — M545 Low back pain: Secondary | ICD-10-CM | POA: Diagnosis not present

## 2018-11-22 DIAGNOSIS — M9903 Segmental and somatic dysfunction of lumbar region: Secondary | ICD-10-CM | POA: Diagnosis not present

## 2018-11-22 NOTE — Therapy (Signed)
[place], [place], [address] Phone: [phone]   Fax:  [phone]  Physical Therapy Treatment  Patient Details  Name: Dawn Reynolds MRN: [id number] Date of Birth: [birth year] Referring Provider (PT): Frederik Pear, MD   Encounter Date: 11/22/2018  PT End of Session - 11/22/18 0855    Visit Number  13    Number of Visits  18    Date for PT Re-Evaluation  12/08/18    Authorization Type  [facility] U[facility]-no visit limit    PT Start Time  2122   in late   PT Stop Time  0951    PT Time Calculation (min)  55 min    Activity Tolerance  Patient limited by pain       Past Medical History:  Diagnosis Date  . Anemia    hx of  . Arthritis   . 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 Chicken pox   . Depression    hx of in past situational  . Fibromyalgia   . GERD (gastroesophageal reflux disease)   . Headache    hx of migraines  . History of kidney stones    1996  . Hypertension     Past Surgical History:  Procedure Laterality Date  . ABDOMINAL HYSTERECTOMY    . CHOLECYSTECTOMY     03-31-18 [name]  . CHOLECYSTECTOMY N/A 03/31/2018   Procedure: LAPAROSCOPIC CHOLECYSTECTOMY;  Surgeon: Ralene Ok, MD;  Location: [facility];  Service: General;  Laterality: N/A;  . COLONOSCOPY    . endoplantar fasciotomy    . GALLBLADDER SURGERY  03/31/2018  . KNEE ARTHROSCOPY     left knee 2012  . LAPAROSCOPIC CHOLECYSTECTOMY    . REPLACEMENT TOTAL KNEE BILATERAL Bilateral 09/27/2018  . TONSILLECTOMY     1999  . TOTAL KNEE ARTHROPLASTY Bilateral 09/27/2018   Procedure: TOTAL KNEE BILATERAL;  Surgeon: Frederik Pear, MD;  Location: [facility];  Service: Orthopedics;  Laterality: Bilateral;  . UPPER GASTROINTESTINAL ENDOSCOPY      There were no vitals filed for this visit.  Subjective Assessment - 11/22/18 0856    Subjective  Pt feels like she may have pulled her Rt hamstring.  Returns to work Midwife.  She is concerned about her  back more than her knees.  Has to work nights for the first 3 weeks  due to schedule being completed already.     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    Currently in Pain?  Yes    Pain Score  3    1/10 in Lt    Pain Location  Knee    Pain Orientation  Right                       [facility] Adult PT Treatment/Exercise - 11/22/18 0001      Exercises   Exercises  Knee/Hip      Knee/Hip Exercises: Stretches   Active Hamstring Stretch  Right   supine with strap and 30 reps knee presses   Passive Hamstring Stretch  Right   seated, rotating hip to get different sections of muscle.    Other Knee/Hip Stretches  butterfly stretch, then  Rt hip adductor stretch with strap      Knee/Hip Exercises: Aerobic   Recumbent Bike  L4x5 min      Knee/Hip Exercises: Prone   Hamstring Curl  3 sets;10 reps   focus on  eccentric lowering, 3#   Other Prone Exercises  20 reps quad sets with toes curled under.       Acupuncturist Location  Rt hip adductor    Electrical Stimulation Action  IFC    Electrical Stimulation Parameters  to tolerance    Electrical Stimulation Goals  Tone;Pain      Manual Therapy   Soft tissue mobilization  STM to lower hamstrings Rt and around insertion site and Rt hip adductors IASTM to Rt hip adductors and ITB               PT Short Term Goals - 11/20/18 1249      PT SHORT TERM GOAL #1   Title  Independent with HEP    Baseline  met with initial HEP    Time  2    Period  Weeks    Status  Achieved      PT SHORT TERM GOAL #2   Title  Increase bilat. knee flexion AROM 5-10 deg to improve ability for transfers from low chairs     Baseline  Rt 130, Lt 125    Time  2    Period  Weeks    Status  Achieved        PT Long Term Goals - 11/20/18 1250      PT LONG TERM GOAL #1   Title  Independent with community level ambulation without AD    Baseline  now able without  AD    Time  4    Period  Weeks    Status  Achieved      PT LONG TERM GOAL #2   Title  Bilat. 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 knee flexion AROM 120 deg to improve ability to descend stairs, bend knees for dressing    Baseline  Rt 130 deg, Lt 125 deg    Time  4    Period  Weeks    Status  Achieved      PT LONG TERM GOAL #4   Title  Right knee extension to neutral for improve heel strike with gait, improved ability upright stance    Baseline  met, able to achive neutral    Time  4    Period  Weeks    Status  On-going            Plan - 11/22/18 1023    Clinical Impression Statement  Dawn Reynolds with insideous onset of Rt distal hamstring and hip adductor pain and tightness. There was palpable banding in these areas with manual work.  She may benefit from some dry needling if this persists.  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  Todays session focused on decreasing her pan symptoms     Rehab Potential  Excellent    PT Frequency  3x / week    PT Duration  3 weeks    PT Treatment/Interventions  ADLs/Self Care Home Management;Electrical Stimulation;Functional mobility training;Neuromuscular re-education;Taping;Therapeutic activities;Cryotherapy;Moist Heat;Gait training;Stair training;Balance training;Therapeutic exercise;Patient/family education;Manual techniques;Passive range of motion;Scar mobilization;Vasopneumatic Device    PT Next Visit  Plan  assess response with return to work and Rt thigh pain    Consulted and Agree with Plan of Care  Patient       Patient will benefit from skilled therapeutic intervention in order to improve the following deficits and impairments:  Abnormal gait, Pain, Decreased  mobility, Decreased activity tolerance, Decreased endurance, Decreased range of motion, Decreased strength, Obesity, Decreased balance, Difficulty walking  Visit Diagnosis: Right knee pain, unspecified chronicity  Left knee pain, unspecified chronicity  Muscle weakness (generalized)  Stiffness of right knee, not elsewhere classified  Stiffness of left knee, not elsewhere classified  Difficulty in walking, not elsewhere classified     Problem List Patient Active Problem List   Diagnosis Date Noted  . S/P TKR (total knee replacement), bilateral 09/27/2018  . Bilateral primary osteoarthritis of knee 09/26/2018  . Obesity (BMI 35.0-39.9 without comorbidity) 04/06/2018  . High triglycerides 04/06/2018  . Chest pain 07/09/2017  . Fibromyalgia 07/09/2017  . Hypertension 07/09/2017    Jeral Pinch PT  11/22/2018, 10:26 AM  [facility] [address] [place], [place], [address] Phone: [phone]   Fax:  [phone]  Name: Kaleia Longhi MRN: [id number] Date of Birth: [birth year]

## 2018-11-24 ENCOUNTER — Ambulatory Visit: Payer: 59 | Admitting: Physical Therapy

## 2018-11-27 ENCOUNTER — Ambulatory Visit: Payer: 59 | Admitting: Physical Therapy

## 2018-11-27 DIAGNOSIS — R262 Difficulty in walking, not elsewhere classified: Secondary | ICD-10-CM

## 2018-11-27 DIAGNOSIS — M6281 Muscle weakness (generalized): Secondary | ICD-10-CM | POA: Diagnosis not present

## 2018-11-27 DIAGNOSIS — M545 Low back pain: Secondary | ICD-10-CM | POA: Diagnosis not present

## 2018-11-27 DIAGNOSIS — M25661 Stiffness of right knee, not elsewhere classified: Secondary | ICD-10-CM | POA: Diagnosis not present

## 2018-11-27 DIAGNOSIS — M25562 Pain in left knee: Secondary | ICD-10-CM

## 2018-11-27 DIAGNOSIS — M25561 Pain in right knee: Secondary | ICD-10-CM | POA: Diagnosis not present

## 2018-11-27 DIAGNOSIS — M25662 Stiffness of left knee, not elsewhere classified: Secondary | ICD-10-CM

## 2018-11-27 DIAGNOSIS — M9903 Segmental and somatic dysfunction of lumbar region: Secondary | ICD-10-CM | POA: Diagnosis not present

## 2018-11-27 NOTE — Therapy (Signed)
[place], [place], [address] Phone: [phone]   Fax:  [phone]  Physical Therapy Treatment  Patient Details  Name: Dawn Reynolds MRN: [id number] Date of Birth: 15-May-1966 Referring Provider (PT): Frederik Pear, MD   Encounter Date: 11/27/2018  PT End of Session - 11/27/18 0848    Visit Number  14    Number of Visits  18    Date for PT Re-Evaluation  12/08/18    Authorization Type  [facility] UMR-no visit limit    PT Start Time  0800    PT Stop Time  8280   last 10 on ice   PT Time Calculation (min)  50 min    Activity Tolerance  Patient tolerated treatment well    Behavior During Therapy  [facility] for tasks assessed/performed       Past Medical History:  Diagnosis Date  . Anemia    hx of  . Arthritis   . 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 Chicken pox   . Depression    hx of in past situational  . Fibromyalgia   . GERD (gastroesophageal reflux disease)   . Headache    hx of migraines  . History of kidney stones    1996  . Hypertension     Past Surgical History:  Procedure Laterality Date  . ABDOMINAL HYSTERECTOMY    . CHOLECYSTECTOMY     03-31-18 [name]  . CHOLECYSTECTOMY N/A 03/31/2018   Procedure: LAPAROSCOPIC CHOLECYSTECTOMY;  Surgeon: Ralene Ok, MD;  Location: [facility];  Service: General;  Laterality: N/A;  . COLONOSCOPY    . endoplantar fasciotomy    . GALLBLADDER SURGERY  03/31/2018  . KNEE ARTHROSCOPY     left knee 2012  . LAPAROSCOPIC CHOLECYSTECTOMY    . REPLACEMENT TOTAL KNEE BILATERAL Bilateral 09/27/2018  . TONSILLECTOMY     1999  . TOTAL KNEE ARTHROPLASTY Bilateral 09/27/2018   Procedure: TOTAL KNEE BILATERAL;  Surgeon: Frederik Pear, MD;  Location: [facility];  Service: Orthopedics;  Laterality: Bilateral;  . UPPER GASTROINTESTINAL ENDOSCOPY      There were no vitals filed for this visit.  Subjective Assessment - 11/27/18 0810    Subjective  Pt relays the Rt hamstring  and back feel better now but having some Lt knee pain today.     Currently in Pain?  Yes    Pain Score  3     Pain Location  Knee    Pain Orientation  Right    Pain Descriptors / Indicators  Aching    Pain Type  Surgical pain    Pain Onset  More than a month ago    Pain Frequency  Intermittent                       [facility] Adult PT Treatment/Exercise - 11/27/18 0001      Knee/Hip Exercises: Stretches   Passive Hamstring Stretch  Right;Left;3 reps;30 seconds    Other Knee/Hip Stretches  butterfly stretch, then  Rt hip adductor stretch with strap      Knee/Hip Exercises: Aerobic   Recumbent Bike  L4x5 min      Knee/Hip Exercises: Standing   Heel Raises  20 reps    Heel Raises Limitations  on Airex    Lateral Step Up  Both;1 set;10 reps;Hand Hold: 2;Step Height: 4"    Forward Step Up  Both;1 set;15 reps;Hand Hold: 1;Step Height: 6"    Forward  Step Up Limitations  with opp hip hike    SLS  alt slow "march" on Airex for SLS x 10 bilat      Knee/Hip Exercises: Seated   Long Arc Quad  Strengthening;Right;Left;20 reps    Long Arc Quad Weight  3 lbs.    Sit to Sand  2 sets;10 reps;without UE support      Knee/Hip Exercises: Supine   Bridges  Both;2 sets;10 reps    Bridges Limitations  legs on bolster      Knee/Hip Exercises: Prone   Hamstring Curl  1 set;20 reps    Hamstring Curl Limitations  3 lbs eccentric lower      Cryotherapy   Number Minutes Cryotherapy  10 Minutes    Cryotherapy Location  Knee               PT Short Term Goals - 11/20/18 1249      PT SHORT TERM GOAL #1   Title  Independent with HEP    Baseline  met with initial HEP    Time  2    Period  Weeks    Status  Achieved      PT SHORT TERM GOAL #2   Title  Increase bilat. knee flexion AROM 5-10 deg to improve ability for transfers from low chairs     Baseline  Rt 130, Lt 125    Time  2    Period  Weeks    Status  Achieved        PT Long Term Goals - 11/20/18 1250      PT  LONG TERM GOAL #1   Title  Independent with community level ambulation without AD    Baseline  now able without AD    Time  4    Period  Weeks    Status  Achieved      PT LONG TERM GOAL #2   Title  Bilat. 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 knee flexion AROM 120 deg to improve ability to descend stairs, bend knees for dressing    Baseline  Rt 130 deg, Lt 125 deg    Time  4    Period  Weeks    Status  Achieved      PT LONG TERM GOAL #4   Title  Right knee extension to neutral for improve heel strike with gait, improved ability upright stance    Baseline  met, able to achive neutral    Time  4    Period  Weeks    Status  On-going            Plan - 11/27/18 0848    Clinical Impression Statement  She had less pain today from insidous onset of Rt hamstring and back pain last week. She was able to progress back into her typical therex for LE strength and endurance with good tolerance. She is making great progress toward her functional goals, PT will continue to progress as tolerated.     Rehab Potential  Excellent    Clinical Impairments Affecting Rehab Potential  presents with deficits as noted  but doing well for timeframe post-op    PT Frequency  3x / week    PT Duration  3 weeks    PT Treatment/Interventions  ADLs/Self Care Home Management;Electrical Stimulation;Functional mobility training;Neuromuscular re-education;Taping;Therapeutic activities;Cryotherapy;Moist Heat;Gait training;Stair training;Balance training;Therapeutic exercise;Patient/family education;Manual techniques;Passive range of motion;Scar mobilization;Vasopneumatic Device    PT Next Visit Plan  progress LE strength and endurance as able    PT Home  Exercise Plan  quad sets, SAQ, SLR, heel slides vs. seated knee flexion stretch, LAQ, sit>stand vs. partial squat,  gait training    Consulted and Agree with Plan of Care  Patient       Patient will benefit from skilled therapeutic intervention in order to improve the following deficits and impairments:  Abnormal gait, Pain, Decreased mobility, Decreased activity tolerance, Decreased endurance, Decreased range of motion, Decreased strength, Obesity, Decreased balance, Difficulty walking  Visit Diagnosis: Right knee pain, unspecified chronicity  Left knee pain, unspecified chronicity  Muscle weakness (generalized)  Stiffness of right knee, not elsewhere classified  Stiffness of left knee, not elsewhere classified  Difficulty in walking, not elsewhere classified     Problem List Patient Active Problem List   Diagnosis Date Noted  . S/P TKR (total knee replacement), bilateral 09/27/2018  . Bilateral primary osteoarthritis of knee 09/26/2018  . Obesity (BMI 35.0-39.9 without comorbidity) 04/06/2018  . High triglycerides 04/06/2018  . Chest pain 07/09/2017  . Fibromyalgia 07/09/2017  . Hypertension 07/09/2017    Silvestre Mesi 11/27/2018, 8:53 AM  [facility] [address] [place], [place], [address] Phone: [phone]   Fax:  [phone]  Name: Dawn Reynolds MRN: [id number] Date of Birth: 11-04-1966

## 2018-12-14 ENCOUNTER — Encounter: Payer: Self-pay | Admitting: Family Medicine

## 2018-12-15 DIAGNOSIS — H5203 Hypermetropia, bilateral: Secondary | ICD-10-CM | POA: Diagnosis not present

## 2018-12-15 NOTE — Telephone Encounter (Signed)
Last seen here in September   10/10/2018  2   10/10/2018  Tramadol Hcl 50 MG Tablet  60.00 [address]   [id number]   Mos (0906)   0  20.00 MME  Comm Ins   [place]  10/03/2018  2   10/03/2018  Tramadol Hcl 50 MG Tablet  30.00 [address]   [id number]   Mos (0906)   0  18.75 MME  Comm Ins   [place]  09/28/2018  2   09/27/2018  Oxycodone-Acetaminophen 5-325  30.00 [address]   [id number]   Mos (0906)   0  45.00 MME  Comm Ins   [place]  04/20/2018  2   04/20/2018  Oxycodone-Acetaminophen 5-325  10.00 10 [address]   [id number]   Med (5269)   0  7.50 MME  Comm Ins   [place]

## 2018-12-21 ENCOUNTER — Emergency Department (HOSPITAL_COMMUNITY)
Admission: EM | Admit: 2018-12-21 | Discharge: 2018-12-21 | Disposition: A | Discharge status: Home / Self Care | Payer: 59 | Attending: Emergency Medicine | Admitting: Emergency Medicine

## 2018-12-21 ENCOUNTER — Emergency Department (HOSPITAL_COMMUNITY): Payer: 59

## 2018-12-21 ENCOUNTER — Encounter (HOSPITAL_COMMUNITY): Payer: Self-pay

## 2018-12-21 ENCOUNTER — Other Ambulatory Visit: Payer: Self-pay

## 2018-12-21 DIAGNOSIS — R0789 Other chest pain: Secondary | ICD-10-CM | POA: Diagnosis not present

## 2018-12-21 DIAGNOSIS — I10 Essential (primary) hypertension: Secondary | ICD-10-CM | POA: Diagnosis not present

## 2018-12-21 DIAGNOSIS — Z96653 Presence of artificial knee joint, bilateral: Secondary | ICD-10-CM | POA: Insufficient documentation

## 2018-12-21 DIAGNOSIS — Z7982 Long term (current) use of aspirin: Secondary | ICD-10-CM | POA: Insufficient documentation

## 2018-12-21 DIAGNOSIS — R110 Nausea: Secondary | ICD-10-CM | POA: Diagnosis not present

## 2018-12-21 DIAGNOSIS — R079 Chest pain, unspecified: Secondary | ICD-10-CM | POA: Diagnosis not present

## 2018-12-21 DIAGNOSIS — R200 Anesthesia of skin: Secondary | ICD-10-CM | POA: Insufficient documentation

## 2018-12-21 DIAGNOSIS — Z0389 Encounter for observation for other suspected diseases and conditions ruled out: Secondary | ICD-10-CM | POA: Diagnosis not present

## 2018-12-21 DIAGNOSIS — Z79899 Other long term (current) drug therapy: Secondary | ICD-10-CM | POA: Diagnosis not present

## 2018-12-21 LAB — I-STAT TROPONIN, ED: Troponin i, poc: 0 ng/mL (ref 0.00–0.08)

## 2018-12-21 LAB — CBC
HCT: 42.1 % (ref 36.0–46.0)
Hemoglobin: 12.8 g/dL (ref 12.0–15.0)
MCH: 25.6 pg — AB (ref 26.0–34.0)
MCHC: 30.4 g/dL (ref 30.0–36.0)
MCV: 84.2 fL (ref 80.0–100.0)
Platelets: 342 10*3/uL (ref 150–400)
RBC: 5 MIL/uL (ref 3.87–5.11)
RDW: 15 % (ref 11.5–15.5)
WBC: 8.2 10*3/uL (ref 4.0–10.5)
nRBC: 0 % (ref 0.0–0.2)

## 2018-12-21 LAB — BASIC METABOLIC PANEL
Anion gap: 10 (ref 5–15)
BUN: 23 mg/dL — AB (ref 6–20)
CO2: 22 mmol/L (ref 22–32)
Calcium: 9.2 mg/dL (ref 8.9–10.3)
Chloride: 107 mmol/L (ref 98–111)
Creatinine, Ser: 0.85 mg/dL (ref 0.44–1.00)
GFR calc Af Amer: >60 mL/min (ref >60)
GFR calc non Af Amer: >60 mL/min (ref >60)
Glucose, Bld: 99 mg/dL (ref 70–99)
Potassium: 3.9 mmol/L (ref 3.5–5.1)
Sodium: 139 mmol/L (ref 135–145)

## 2018-12-21 LAB — TROPONIN I: Troponin I: <0.03 ng/mL (ref <0.03)

## 2018-12-21 LAB — HEPATIC FUNCTION PANEL
ALBUMIN: 3.7 g/dL (ref 3.5–5.0)
ALK PHOS: 64 U/L (ref 38–126)
ALT: 15 U/L (ref 0–44)
AST: 21 U/L (ref 15–41)
Bilirubin, Direct: 0.1 mg/dL (ref 0.0–0.2)
Indirect Bilirubin: 0.7 mg/dL (ref 0.3–0.9)
Total Bilirubin: 0.8 mg/dL (ref 0.3–1.2)
Total Protein: 6.6 g/dL (ref 6.5–8.1)

## 2018-12-21 LAB — LIPASE, BLOOD: Lipase: 38 U/L (ref 11–51)

## 2018-12-21 LAB — D-DIMER, QUANTITATIVE: D-Dimer, Quant: 2.56 ug/mL-FEU — ABNORMAL HIGH (ref 0.00–0.50)

## 2018-12-21 MED ORDER — LIDOCAINE VISCOUS HCL 2 % MT SOLN
15.0000 mL | Freq: Once | OROMUCOSAL | Status: AC
  Administered 2018-12-21: 15 mL via ORAL
  Filled 2018-12-21: qty 15

## 2018-12-21 MED ORDER — SODIUM CHLORIDE 0.9 % IV BOLUS
1000.0000 mL | Freq: Once | INTRAVENOUS | Status: AC
  Administered 2018-12-21: 1000 mL via INTRAVENOUS

## 2018-12-21 MED ORDER — ALUM & MAG HYDROXIDE-SIMETH 200-200-20 MG/5ML PO SUSP
30.0000 mL | Freq: Once | ORAL | Status: AC
  Administered 2018-12-21: 30 mL via ORAL
  Filled 2018-12-21: qty 30

## 2018-12-21 MED ORDER — ONDANSETRON HCL 4 MG/2ML IJ SOLN
4.0000 mg | Freq: Once | INTRAMUSCULAR | Status: AC
  Administered 2018-12-21: 4 mg via INTRAVENOUS
  Filled 2018-12-21: qty 2

## 2018-12-21 MED ORDER — IOPAMIDOL (ISOVUE-370) INJECTION 76%
INTRAVENOUS | Status: AC
  Administered 2018-12-21: 17:00:00
  Filled 2018-12-21: qty 100

## 2018-12-21 NOTE — ED Triage Notes (Signed)
Pt endorses left sided chest pain radiating into the left shoulder going down the left arm and right shoulder. Denies shob, dizziness, n/v. No cardiac hx but has hx of clean cardiac cath in the past. VSS

## 2018-12-21 NOTE — Discharge Instructions (Signed)
Follow up with your family doctor.  Return for worsening symptoms, if you pass out, or cough up blood.

## 2018-12-21 NOTE — ED Provider Notes (Signed)
53 yo F with a chief complaint chest pain.  I received the patient in signout from Dr. Gilford Raid.  Briefly the patient has had pain like this before thought to be most likely gastric in nature patient had a positive d-dimer and we are awaiting a CT angiogram of the chest.  She had a troponin that was negative, delta was negative.  CT scan returned and is negative for pulmonary embolism or pneumonia.  I discussed results with the patient who is somewhat concerned that she is unsure what the answer is.  I discussed with her at length about following up with her family doctor and possibly GI for repeat evaluation.   Deno Etienne, DO 12/21/18 1758

## 2018-12-21 NOTE — ED Notes (Signed)
Pt verbalized understanding of d/c instructions and has no further questions, VSS, NAD.  

## 2018-12-21 NOTE — ED Notes (Signed)
Patient returned from CT

## 2018-12-21 NOTE — ED Notes (Signed)
Patient transported to CT 

## 2018-12-21 NOTE — ED Provider Notes (Signed)
[facility] EMERGENCY DEPARTMENT Provider Note   CSN: [id number] Arrival date & time: 12/21/18  1035     History   Chief Complaint Chief Complaint  Patient presents with  . Chest Pain    HPI Carine Carolynn Comment is a 53 y.o. female.  Pt presents to the ED today with CP.  She said it's been intermittent for several days.  She has not had an appetite.  She has not been sleeping well.  She has had some left arm numbness and nausea.  She has been belching a lot.  She has taken extra prilosec in case it was GI, but that has not helped.  The pt is a nurse on the cardiac floor.  She went to work this morning, but cp is worse, so she came to the ED.     Past Medical History:  Diagnosis Date  . Anemia    hx of  . Arthritis   . 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 Chicken pox   . Depression    hx of in past situational  . Fibromyalgia   . GERD (gastroesophageal reflux disease)   . Headache    hx of migraines  . History of kidney stones    1996  . Hypertension     Patient Active Problem List   Diagnosis Date Noted  . S/P TKR (total knee replacement), bilateral 09/27/2018  . Bilateral primary osteoarthritis of knee 09/26/2018  . Obesity (BMI 35.0-39.9 without comorbidity) 04/06/2018  . High triglycerides 04/06/2018  . Chest pain 07/09/2017  . Fibromyalgia 07/09/2017  . Hypertension 07/09/2017    Past Surgical History:  Procedure Laterality Date  . ABDOMINAL HYSTERECTOMY    . CHOLECYSTECTOMY     03-31-18 [name]  . CHOLECYSTECTOMY N/A 03/31/2018   Procedure: LAPAROSCOPIC CHOLECYSTECTOMY;  Surgeon: Ralene Ok, MD;  Location: [facility];  Service: General;  Laterality: N/A;  . COLONOSCOPY    . endoplantar fasciotomy    . GALLBLADDER SURGERY  03/31/2018  . KNEE ARTHROSCOPY     left knee 2012  . LAPAROSCOPIC CHOLECYSTECTOMY    . REPLACEMENT TOTAL KNEE BILATERAL Bilateral 09/27/2018  . TONSILLECTOMY     1999  . TOTAL KNEE ARTHROPLASTY Bilateral 09/27/2018    Procedure: TOTAL KNEE BILATERAL;  Surgeon: Frederik Pear, MD;  Location: [facility];  Service: Orthopedics;  Laterality: Bilateral;  . UPPER GASTROINTESTINAL ENDOSCOPY       OB History   No obstetric history on file.      Home Medications    Prior to Admission medications   Medication Sig Start Date End Date Taking? Authorizing Provider  amLODipine (NORVASC) 10 MG tablet Take 1 tablet (10 mg total) by mouth daily. 04/13/18   Copland, Gay Filler, MD  aspirin EC 81 MG tablet Take 1 tablet (81 mg total) by mouth 2 (two) times daily. 09/27/18   Leighton Parody, PA-C  atorvastatin (LIPITOR) 20 MG tablet Take 1 tablet (20 mg total) by mouth daily. Patient not taking: Reported on 09/21/2018 04/13/18   Copland, Gay Filler, MD  B Complex Vitamins (B COMPLEX 1 PO) Take 1 tablet by mouth daily.     [provider]  Biotin 5000 MCG CAPS Take 5,000 mcg by mouth daily.    [provider]  Calcium Carb-Cholecalciferol (CALCIUM 600+D) 600-800 MG-UNIT TABS Take 1 tablet by mouth daily.    [provider]  celecoxib (CELEBREX) 200 MG capsule Take 1 capsule (200 mg total) by mouth 2 (two)  times daily. 09/27/18 09/27/19  Leighton Parody, PA-C  cholecalciferol (VITAMIN D3) 25 MCG (1000 UT) tablet Take 1,000 Units by mouth daily.    [provider]  DULoxetine (CYMBALTA) 60 MG capsule Take 1 capsule (60 mg total) by mouth 2 (two) times daily. 04/06/18   Copland, Gay Filler, MD  Flaxseed, Linseed, (FLAX SEED OIL PO) Take 1,400 mg by mouth daily.    [provider]  gabapentin (NEURONTIN) 300 MG capsule Take 1 capsule (300 mg total) by mouth 3 (three) times daily. 09/27/18 09/27/19  Leighton Parody, PA-C  Garlic (GARLIQUE PO) Take 1 tablet by mouth daily.    [provider]  hydrochlorothiazide (MICROZIDE) 12.5 MG capsule Take 1 capsule (12.5 mg total) by mouth daily. 04/13/18   Copland, Gay Filler, MD  methocarbamol (ROBAXIN) 500 MG tablet Take 1 tablet (500 mg total)  by mouth 2 (two) times daily with a meal. 09/27/18   Leighton Parody, PA-C  nitrofurantoin, macrocrystal-monohydrate, (MACROBID) 100 MG capsule Take 1 capsule daily as needed following intercourse to prevent UTI Patient taking differently: Take 100 mg by mouth See admin instructions. Take 100 mg  daily as needed following intercourse to prevent UTI 04/06/18   Copland, Gay Filler, MD  Omega-3 1000 MG CAPS Take 1,000 mg by mouth daily.    [provider]  omeprazole (PRILOSEC) 20 MG capsule Take 1 capsule (20 mg total) by mouth daily. 04/13/18   Copland, Gay Filler, MD  oxyCODONE-acetaminophen (PERCOCET/ROXICET) 5-325 MG tablet Take 1 tablet by mouth every 4 (four) hours as needed for severe pain. Patient not taking: Reported on 10/11/2018 09/27/18   Leighton Parody, PA-C  traMADol (ULTRAM) 50 MG tablet Take 50 mg by mouth every 6 (six) hours as needed.    [provider]  TURMERIC PO Take 1 capsule by mouth daily.    [provider]  vitamin C (ASCORBIC ACID) 500 MG tablet Take 500 mg by mouth daily.    [provider]    Family History Family History  Problem Relation Age of Onset  . Heart disease Father   . Prostate cancer Father   . Diabetes Father   . Irritable bowel syndrome Father   . Kidney disease Father   . Mitral valve prolapse Mother   . Heart disease Mother   . Pancreatic cancer Maternal Aunt   . Liver cancer Paternal Uncle   . Colon cancer Paternal Uncle   . Esophageal cancer Neg Hx   . Stomach cancer Neg Hx   . Rectal cancer Neg Hx     Social History Social History   Tobacco Use  . Smoking status: Never Smoker  . Smokeless tobacco: Never Used  Substance Use Topics  . Alcohol use: Yes    Comment: occ  . Drug use: No     Allergies   Aspartame and Demerol [meperidine]   Review of Systems Review of Systems  Constitutional: Positive for activity change and appetite change.  Cardiovascular: Positive for chest pain.    Gastrointestinal: Positive for nausea.  All other systems reviewed and are negative.    Physical Exam Updated Vital Signs BP 107/62   Pulse 69   Temp 98 F (36.7 C) (Oral)   Resp (!) 23   Ht 5\' 6"  (1.676 m)   Wt 111.1 kg   LMP 06/09/2015 (Approximate)   SpO2 93%   BMI 39.54 kg/m   Physical Exam Vitals signs and nursing note reviewed.  Constitutional:  Appearance: She is well-developed.  HENT:     Head: Normocephalic and atraumatic.  Eyes:     Extraocular Movements: Extraocular movements intact.     Pupils: Pupils are equal, round, and reactive to light.  Neck:     Musculoskeletal: Normal range of motion and neck supple.  Cardiovascular:     Rate and Rhythm: Normal rate and regular rhythm.     Heart sounds: Normal heart sounds.  Pulmonary:     Effort: Pulmonary effort is normal.     Breath sounds: Normal breath sounds.  Abdominal:     Palpations: Abdomen is soft.  Musculoskeletal: Normal range of motion.  Skin:    General: Skin is warm.     Capillary Refill: Capillary refill takes less than 2 seconds.  Neurological:     General: No focal deficit present.     Mental Status: She is alert and oriented to person, place, and time.  Psychiatric:        Mood and Affect: Mood normal.        Behavior: Behavior normal.      ED Treatments / Results  Labs (all labs ordered are listed, but only abnormal results are displayed) Labs Reviewed  BASIC METABOLIC PANEL - Abnormal; Notable for the following components:      Result Value   BUN 23 (*)    All other components within normal limits  CBC - Abnormal; Notable for the following components:   MCH 25.6 (*)    All other components within normal limits  D-DIMER, QUANTITATIVE (NOT AT [facility]) - Abnormal; Notable for the following components:   D-Dimer, Quant 2.56 (*)    All other components within normal limits  TROPONIN I  LIPASE, BLOOD  HEPATIC FUNCTION PANEL  I-STAT TROPONIN, ED    EKG EKG  Interpretation  Date/Time:  Thursday December 21 2018 10:45:44 EST Ventricular Rate:  74 PR Interval:  134 QRS Duration: 90 QT Interval:  414 QTC Calculation: 459 R Axis:   21 Text Interpretation:  Normal sinus rhythm Low voltage QRS Cannot rule out Anterior infarct , age undetermined Abnormal ECG No old tracing to compare Confirmed by Isla Pence [phone]) on 12/21/2018 1:45:15 PM   Radiology Dg Chest 2 View  Result Date: 12/21/2018 CLINICAL DATA:  Chest pain EXAM: CHEST - 2 VIEW COMPARISON:  September 26, 2018 FINDINGS: There is minimal scarring in the left base. The lungs elsewhere are clear. Heart size and pulmonary vascularity are normal. No adenopathy. No bone lesions. No pneumothorax. IMPRESSION: Minimal scarring left base. No edema or consolidation. Stable cardiac silhouette. Electronically Signed   By: Lowella Grip III M.D.   On: 12/21/2018 11:29    Procedures Procedures (including critical care time)  Medications Ordered in ED Medications  sodium chloride 0.9 % bolus 1,000 mL (1,000 mLs Intravenous New Bag/Given 12/21/18 1436)  ondansetron (ZOFRAN) injection 4 mg (4 mg Intravenous Given 12/21/18 1430)  alum & mag hydroxide-simeth (MAALOX/MYLANTA) 200-200-20 MG/5ML suspension 30 mL (30 mLs Oral Given 12/21/18 1431)    And  lidocaine (XYLOCAINE) 2 % viscous mouth solution 15 mL (15 mLs Oral Given 12/21/18 1431)     Initial Impression / Assessment and Plan / ED Course  I have reviewed the triage vital signs and the nursing notes.  Pertinent labs & imaging results that were available during my care of the patient were reviewed by me and considered in my medical decision making (see chart for details).    Sx sound more GI than  cardiac.  2 negative troponins.  DDimer positive, so a CT chest will be ordered.  If that is negative, pt can go home.  Pt signed out to Dr. Tyrone Nine pending CT result.  Final Clinical Impressions(s) / ED Diagnoses   Final diagnoses:  Atypical  chest pain    ED Discharge Orders    None       Isla Pence, MD 12/21/18 [phone]

## 2018-12-26 DIAGNOSIS — M1712 Unilateral primary osteoarthritis, left knee: Secondary | ICD-10-CM | POA: Diagnosis not present

## 2018-12-26 DIAGNOSIS — M1711 Unilateral primary osteoarthritis, right knee: Secondary | ICD-10-CM | POA: Diagnosis not present

## 2018-12-26 NOTE — Progress Notes (Signed)
[facility] [address] [place], [place] [address] [phone]  Date:  12/28/2018   Name:  Dawn Reynolds   DOB:  [birth year]   MRN:  [id number]  PCP:  Darreld Mclean, MD    Chief Complaint: ER follow up (atypical chest pain, seen 12/21/2018, )   History of Present Illness:  Dawn Reynolds is a 53 y.o. 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  She was released to home After she went home from the ER, she was very tired.   Notes that she slept for most of a day She notes that she is still having pain in her chest.  It seems to constant at a low level, but will get more severe if she is active The pain will radiate to her left arm She does have easy sweating but this is not new She has noted some nausea Jaw pain occurred just once last week -has not persisted She may feel SOB with activity, like if she is walking fast She has spoken with Dr. Claiborne Billings while at work, he has agreed to see her in the office   She does admit to stress and is not sleeping well   Paged Dr. Claiborne Billings but was not able to reach him.  Called back and spoke with Dr. Harrell Gave.  She recommended that we get a repeat troponin for this patient, either in the ER or here in the office.  I am able to order a stat troponin, which is the patient's preference  Dg Chest 2 View  Result Date: 12/21/2018 CLINICAL DATA:  Chest pain EXAM: CHEST - 2 VIEW COMPARISON:  September 26, 2018 FINDINGS: There is minimal scarring in the left base. The lungs elsewhere are clear. Heart size and pulmonary vascularity are normal. No adenopathy. No bone lesions. No  pneumothorax. IMPRESSION: Minimal scarring left base. No edema or consolidation. Stable cardiac silhouette. Electronically Signed   By: Lowella Grip III M.D.   On: 12/21/2018 11:29   Ct Angio Chest Pe W And/or Wo Contrast  Result Date: 12/21/2018 CLINICAL DATA:  PE suspected EXAM: CT ANGIOGRAPHY CHEST WITH CONTRAST TECHNIQUE: Multidetector CT imaging of the chest was performed using the standard protocol during bolus administration of intravenous contrast. Multiplanar CT image reconstructions and MIPs were obtained to evaluate the vascular anatomy. CONTRAST:  <See Chart> ISOVUE-370 IOPAMIDOL (ISOVUE-370) INJECTION 76% COMPARISON:  Same day chest radiograph FINDINGS: Cardiovascular: Satisfactory opacification of the pulmonary arteries to the segmental level. No evidence of pulmonary embolism. Normal heart size. No pericardial effusion. Mediastinum/Nodes: No enlarged mediastinal, hilar, or axillary lymph nodes. Thyroid gland, trachea, and esophagus demonstrate no significant findings. Lungs/Pleura: Bibasilar scarring. Minimal partial atelectasis on expiratory phase examination. No pleural effusion or pneumothorax. Upper Abdomen: No acute abnormality. Musculoskeletal: No chest wall abnormality. No acute or significant osseous findings. Review of the MIP images confirms the above findings. IMPRESSION: 1.  Negative examination for pulmonary embolism. 2.  Bibasilar scarring. Electronically Signed   By: Eddie Candle M.D.   On: 12/21/2018 16:48   She personally does not have any heart issues, but she has a  bad family history  She did do a stress in 2008/9; did ok She also reports that actualy she had a cath in approx 2016 which was benign Her father and his siblings- her uncle and aunt- all had CABG but died from cancer  Bilateral total knee in November  She has never been a smoker Patient Active Problem List   Diagnosis Date Noted  . S/P TKR (total knee replacement), bilateral 09/27/2018  . Bilateral  primary osteoarthritis of knee 09/26/2018  . Obesity (BMI 35.0-39.9 without comorbidity) 04/06/2018  . High triglycerides 04/06/2018  . Chest pain 07/09/2017  . Fibromyalgia 07/09/2017  . Hypertension 07/09/2017    Past Medical History:  Diagnosis Date  . Anemia    hx of  . Arthritis   . 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 Chicken pox   . Depression    hx of in past situational  . Fibromyalgia   . GERD (gastroesophageal reflux disease)   . Headache    hx of migraines  . History of kidney stones    1996  . Hypertension     Past Surgical History:  Procedure Laterality Date  . ABDOMINAL HYSTERECTOMY    . CHOLECYSTECTOMY     03-31-18 [name]  . CHOLECYSTECTOMY N/A 03/31/2018   Procedure: LAPAROSCOPIC CHOLECYSTECTOMY;  Surgeon: Ralene Ok, MD;  Location: [facility];  Service: General;  Laterality: N/A;  . COLONOSCOPY    . endoplantar fasciotomy    . GALLBLADDER SURGERY  03/31/2018  . KNEE ARTHROSCOPY     left knee 2012  . LAPAROSCOPIC CHOLECYSTECTOMY    . REPLACEMENT TOTAL KNEE BILATERAL Bilateral 09/27/2018  . TONSILLECTOMY     1999  . TOTAL KNEE ARTHROPLASTY Bilateral 09/27/2018   Procedure: TOTAL KNEE BILATERAL;  Surgeon: Frederik Pear, MD;  Location: [facility];  Service: Orthopedics;  Laterality: Bilateral;  . UPPER GASTROINTESTINAL ENDOSCOPY      Social History   Tobacco Use  . Smoking status: Never Smoker  . Smokeless tobacco: Never Used  Substance Use Topics  . Alcohol use: Yes    Comment: occ  . Drug use: No    Family History  Problem Relation Age of Onset  . Heart disease Father   . Prostate cancer Father   . Diabetes Father   . Irritable bowel syndrome Father   . Kidney disease Father   . Mitral valve prolapse Mother   . Heart disease Mother   . Pancreatic cancer Maternal Aunt   . Liver cancer Paternal Uncle   . Colon cancer Paternal Uncle   . Esophageal cancer Neg Hx   . Stomach cancer Neg Hx   . Rectal cancer Neg Hx     Allergies   Allergen Reactions  . Aspartame Other (See Comments)    Headaches.  . Demerol [Meperidine] Nausea And Vomiting    Medication list has been reviewed and updated.  Current Outpatient Medications on File Prior to Visit  Medication Sig Dispense Refill  . amLODipine (NORVASC) 10 MG tablet Take 1 tablet (10 mg total) by mouth daily. 90 tablet 3  . aspirin EC 81 MG tablet Take 1 tablet (81 mg total) by mouth 2 (two) times daily. 60 tablet 0  . B Complex Vitamins (B COMPLEX 1 PO) Take 1 tablet by mouth daily.     . Biotin 5000 MCG CAPS Take 5,000 mcg by mouth daily.    . Calcium Carb-Cholecalciferol (CALCIUM 600+D) 600-800 MG-UNIT TABS Take 1 tablet by mouth daily.    Marland Kitchen  cholecalciferol (VITAMIN D3) 25 MCG (1000 UT) tablet Take 1,000 Units by mouth daily.    . cyclobenzaprine (FLEXERIL) 10 MG tablet Take 10 mg by mouth at bedtime. PRN    . DULoxetine (CYMBALTA) 60 MG capsule Take 1 capsule (60 mg total) by mouth 2 (two) times daily. 180 capsule 3  . Flaxseed, Linseed, (FLAX SEED OIL PO) Take 1,400 mg by mouth daily.    . Garlic (GARLIQUE PO) Take 1 tablet by mouth daily.    . hydrochlorothiazide (MICROZIDE) 12.5 MG capsule Take 1 capsule (12.5 mg total) by mouth daily. 90 capsule 3  . meloxicam (MOBIC) 15 MG tablet Take 15 mg by mouth daily.    . nitrofurantoin, macrocrystal-monohydrate, (MACROBID) 100 MG capsule Take 1 capsule daily as needed following intercourse to prevent UTI (Patient taking differently: Take 100 mg by mouth See admin instructions. Take 100 mg  daily as needed following intercourse to prevent UTI) 30 capsule 2  . Omega-3 1000 MG CAPS Take 1,000 mg by mouth daily.    Marland Kitchen omeprazole (PRILOSEC) 20 MG capsule Take 1 capsule (20 mg total) by mouth daily. 90 capsule 3  . TURMERIC PO Take 1 capsule by mouth daily.    . vitamin C (ASCORBIC ACID) 500 MG tablet Take 500 mg by mouth daily.     No current facility-administered medications on file prior to visit.     Review of  Systems:  As per HPI- otherwise negative. No fever or chills   Physical Examination: Vitals:   12/28/18 1113  BP: 122/84  Pulse: 73  Resp: 16  Temp: 98 F (36.7 C)  SpO2: 97%   Vitals:   12/28/18 1113  Weight: 254 lb (115.2 kg)  Height: 5\' 6"  (1.676 m)   Body mass index is 41 kg/m. Ideal Body Weight: Weight in (lb) to have BMI = 25: 154.6  GEN: WDWN, NAD, Non-toxic, A & O x 3, obese, looks well  HEENT: Atraumatic, Normocephalic. Neck supple. No masses, No LAD. Ears and Nose: No external deformity. CV: RRR, No M/G/R. No JVD. No thrill. No extra heart sounds. PULM: CTA B, no wheezes, crackles, rhonchi. No retractions. No resp. distress. No accessory muscle use. ABD: S, NT, ND EXTR: No c/c/e NEURO Normal gait.  PSYCH: Normally interactive. Conversant. Not depressed or anxious appearing.  Calm demeanor.  She does have pain with pressure on her chest wall in the office today  No redness or sign change of her chest   EKG: Sinus rhythm with rate of 69.  Possible anterior infarct.  Read with EKG from a week ago, no significant changes noted Assessment and Plan: Chest pain, unspecified type - Plan: EKG 12-Lead, Troponin I -  Hospital discharge follow-up  Hyperlipidemia, unspecified hyperlipidemia type - Plan: Lipid panel  Today with continued chest pain over the last approximately 9 days.  She has no history of coronary disease herself, though she does have a family history. EKG today shows age undetermined anterior MI, unchanged from a week ago However, she does report a low risk coronary cath done, she thinks in 2016. She also does have tenderness of her chest wall today, her pain may be musculoskeletal  As her pain is continued, will repeat a troponin today. Patient understands that if she has a positive troponin, she will need to go to the ER She would also like a cholesterol panel today Made appt with Dr. Claiborne Billings for next week  Signed Lamar Blinks, MD  Results for  orders placed  or performed in visit on 12/28/18  Troponin I -  Result Value Ref Range   TNIDX 0.00 0.00 - 0.06 ug/l  Lipid panel  Result Value Ref Range   Cholesterol 209 (H) 0 - 200 mg/dL   Triglycerides 256.0 (H) 0.0 - 149.0 mg/dL   HDL 51.10 >39.00 mg/dL   VLDL 51.2 (H) 0.0 - 40.0 mg/dL   Total CHOL/HDL Ratio 4    NonHDL 157.50   LDL cholesterol, direct  Result Value Ref Range   Direct LDL 119.0 mg/dL   Contacted patient at the time of her troponin, it is normal Also received her cholesterol- less favorable Another message to pt

## 2018-12-28 ENCOUNTER — Encounter: Payer: Self-pay | Admitting: Family Medicine

## 2018-12-28 ENCOUNTER — Ambulatory Visit (INDEPENDENT_AMBULATORY_CARE_PROVIDER_SITE_OTHER): Payer: 59 | Admitting: Family Medicine

## 2018-12-28 VITALS — BP 122/84 | HR 73 | Temp 98.0°F | Resp 16 | Ht 66.0 in | Wt 254.0 lb

## 2018-12-28 DIAGNOSIS — E785 Hyperlipidemia, unspecified: Secondary | ICD-10-CM

## 2018-12-28 DIAGNOSIS — R079 Chest pain, unspecified: Secondary | ICD-10-CM

## 2018-12-28 DIAGNOSIS — Z09 Encounter for follow-up examination after completed treatment for conditions other than malignant neoplasm: Secondary | ICD-10-CM | POA: Diagnosis not present

## 2018-12-28 LAB — LIPID PANEL
Cholesterol: 209 mg/dL — ABNORMAL HIGH (ref 0–200)
HDL: 51.1 mg/dL (ref >39.00)
NonHDL: 157.5
Total CHOL/HDL Ratio: 4
Triglycerides: 256 mg/dL — ABNORMAL HIGH (ref 0.0–149.0)
VLDL: 51.2 mg/dL — ABNORMAL HIGH (ref 0.0–40.0)

## 2018-12-28 LAB — TROPONIN I: TNIDX: 0 ug/L (ref 0.00–0.06)

## 2018-12-28 LAB — LDL CHOLESTEROL, DIRECT: Direct LDL: 119 mg/dL

## 2018-12-28 NOTE — Patient Instructions (Addendum)
Please go to the lab for a stat troponin- I will contact you with this later today Cardiology appt 2/25 at 4pm with Dr. Claiborne Billings at [facility]

## 2019-01-02 ENCOUNTER — Ambulatory Visit (INDEPENDENT_AMBULATORY_CARE_PROVIDER_SITE_OTHER): Payer: 59 | Admitting: Cardiovascular Disease

## 2019-01-02 ENCOUNTER — Encounter: Payer: Self-pay | Admitting: Cardiovascular Disease

## 2019-01-02 VITALS — BP 118/82 | HR 73 | Ht 66.0 in | Wt 252.0 lb

## 2019-01-02 DIAGNOSIS — K219 Gastro-esophageal reflux disease without esophagitis: Secondary | ICD-10-CM

## 2019-01-02 DIAGNOSIS — H0263 Xanthelasma of right eye, unspecified eyelid: Secondary | ICD-10-CM

## 2019-01-02 DIAGNOSIS — R079 Chest pain, unspecified: Secondary | ICD-10-CM

## 2019-01-02 DIAGNOSIS — E782 Mixed hyperlipidemia: Secondary | ICD-10-CM | POA: Diagnosis not present

## 2019-01-02 DIAGNOSIS — Z8249 Family history of ischemic heart disease and other diseases of the circulatory system: Secondary | ICD-10-CM | POA: Diagnosis not present

## 2019-01-02 DIAGNOSIS — H0266 Xanthelasma of left eye, unspecified eyelid: Secondary | ICD-10-CM | POA: Diagnosis not present

## 2019-01-02 DIAGNOSIS — M797 Fibromyalgia: Secondary | ICD-10-CM | POA: Diagnosis not present

## 2019-01-02 DIAGNOSIS — Z79899 Other long term (current) drug therapy: Secondary | ICD-10-CM

## 2019-01-02 MED ORDER — ICOSAPENT ETHYL 1 G PO CAPS: 2.0000 g | ORAL_CAPSULE | Freq: Two times a day (BID) | ORAL | 3 refills | Status: DC

## 2019-01-02 MED ORDER — METOPROLOL TARTRATE 100 MG PO TABS: 100.0000 mg | ORAL_TABLET | Freq: Once | ORAL | 0 refills | Status: DC

## 2019-01-02 MED ORDER — ROSUVASTATIN CALCIUM 20 MG PO TABS: 20.0000 mg | ORAL_TABLET | Freq: Every day | ORAL | 3 refills | Status: DC

## 2019-01-02 MED FILL — ROSUVASTATIN CALCIUM 20 MG: 20 | 30 days supply | Qty: 30 | Fill #0

## 2019-01-02 MED FILL — VASCEPA 1 GM CAPSULE: 1 | 30 days supply | Qty: 120 | Fill #0

## 2019-01-02 NOTE — Patient Instructions (Addendum)
Medication Instructions:  START CRESTOR 20MG  AT BEDTIME START VASCEPA 2GR(2CAPS) TWICE DAILY If you need a refill on your cardiac medications before your next appointment, please call your pharmacy.  Labwork: FASTING LIPID,CMET AND TSH HERE IN OUR OFFICE AT [facility]  You will need to fast. DO NOT EAT OR DRINK PAST MIDNIGHT.       Testing/Procedures: Echocardiogram - Your physician has requested that you have an echocardiogram. Echocardiography is a painless test that uses sound waves to create images of your heart. It provides your doctor with information about the size and shape of your heart and how well your heart's chambers and valves are working. This procedure takes approximately one hour. There are no restrictions for this procedure. This will be performed at our [facility] location - [address], [address].  Follow-Up: You will need a follow up apointment in 2 months ON 03-20-2019 @240PM .  You may see Shelva Majestic, MD  or one of the following Advanced Practice Providers on your designated Care Team: Almyra Deforest, PA-C  Fabian Sharp, PA-C At [facility], you and your health needs are our priority.  As part of our continuing mission to provide you with exceptional heart care, we have created designated Provider Care Teams.  These Care Teams include your primary Cardiologist (physician) and Advanced Practice Providers (APPs -  Physician Assistants and Nurse Practitioners) who all work together to provide you with the care you need, when you need it.  Thank you for choosing [facility] at [facility]!!     Please arrive at the [facility] main entrance of [facility] at    AM (30-45 minutes prior to test start time)  [facility] [place], [place] [address] [phone]  Proceed to the [facility] Radiology Department (First Floor).  Please follow these instructions carefully (unless otherwise directed):  Hold all erectile dysfunction medications at  least 48 hours prior to test.  On the Night Before the Test: . Be sure to Drink plenty of water. . Do not consume any caffeinated/decaffeinated beverages or chocolate 12 hours prior to your test. . Do not take any antihistamines 12 hours prior to your test.  On the Day of the Test: . Drink plenty of water. Do not drink any water within one hour of the test. . Do not eat any food 4 hours prior to the test. . You may take your regular medications prior to the test.  . Take metoprolol (Lopressor) two hours prior to test. . HOLD Hydrochlorothiazide morning of the test.      -Take metoprolol (Lopressor) 2 hours prior to test (if applicable).       After the Test: . Drink plenty of water. . After receiving IV contrast, you may experience a mild flushed feeling. This is normal. . On occasion, you may experience a mild rash up to 24 hours after the test. This is not dangerous. If this occurs, you can take Benadryl 25 mg and increase your fluid intake. . If you experience trouble breathing, this can be serious. If it is severe call 911 IMMEDIATELY. If it is mild, please call our office. . If you take any of these medications: Glipizide/Metformin, Avandament, Glucavance, please do not take 48 hours after completing test.

## 2019-01-02 NOTE — Progress Notes (Signed)
Cardiology Office Note    Date:  01/02/2019   ID:  Dawn Reynolds, Dawn Reynolds 1966/04/11, MRN [id number]  PCP:  Darreld Mclean, MD  Cardiologist:  Shelva Majestic, MD   New Cardiology evaluation  Chief Complaint  Patient presents with  . Chest Pain    pt c/o chest pain  . Shortness of Breath    History of Present Illness:  Dawn Reynolds is a 53 y.o. female who presents for new cardiology evaluation with a chief complaint of left-sided chest pain the past several weeks.  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  She states that she has a history of significant hyperlipidemia but has never been on treatment.  Several years ago her triglycerides were over 600 and a year ago are over 400.  She was never started on medication but on her own instituted over-the-counter omega-3 fatty acid in addition to garlic and flaxseed oil.  Over the past 2 weeks, she has begun to notice left-sided chest pain radiating to her arm.  She would notice a fairly constant discomfort along the left side of her chest.  At times when she would rest around she would notice that perhaps she had slightly more discomfort.  She does have a longstanding history of fibromyalgia.  In November 2020 she underwent bilateral knee replacement surgery by Dr. Frederik Pear.  She admits to at least a 12 pound weight gain with some immobility since her surgery but apparently as of November 22, 2018 she returned to work and has been working full-time on her feet most of the day.  Due to several days of chest pain development she ultimately presented to [facility] emergency room on February 13 with atypical chest pain.  She had experienced constant chest pain at a low level but became more severe with activity.  She also had episodes of increased perspiration.  In the emergency room, troponins were negative.  D-dimer was mildly positive at 2.56.  She had a normal lipase, normal LFTs, and she was not anemic with a  hemoglobin of 12.8 and hematocrit of 42.1.  Chest x-ray revealed minimal scarring at the left base without edema or consolidation.  Because of her elevated d-dimer a CT scan was performed of her chest which was negative for PE.  She was noted to have bibasilar scarring.  She saw her primary physician, Dr. Beckie Salts in follow-up of her ER evaluation.  When she saw Dr. Margaretmary Bayley the patient requested lipid studies be obtained.  This showed slight improvement from her total cholesterol of 9 months ago at 246 which was now at 209.  Her triglycerides had improved from 403 but were still significantly elevated at 256.  VLDL was elevated at 51.2 and her HDL had risen to 51.1.  Direct LDL was 119.  The patient now presents for cardiology evaluation.  Past Medical History:  Diagnosis Date  . Anemia    hx of  . Arthritis   . 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 Chicken pox   . Depression    hx of in past situational  . Fibromyalgia   . GERD (gastroesophageal reflux disease)   . Headache    hx of migraines  . History of kidney stones    1996  . Hypertension     Past Surgical History:  Procedure Laterality Date  . ABDOMINAL HYSTERECTOMY    . CHOLECYSTECTOMY     03-31-18 [name]  .  CHOLECYSTECTOMY N/A 03/31/2018   Procedure: LAPAROSCOPIC CHOLECYSTECTOMY;  Surgeon: Ralene Ok, MD;  Location: [facility];  Service: General;  Laterality: N/A;  . COLONOSCOPY    . endoplantar fasciotomy    . GALLBLADDER SURGERY  03/31/2018  . KNEE ARTHROSCOPY     left knee 2012  . LAPAROSCOPIC CHOLECYSTECTOMY    . REPLACEMENT TOTAL KNEE BILATERAL Bilateral 09/27/2018  . TONSILLECTOMY     1999  . TOTAL KNEE ARTHROPLASTY Bilateral 09/27/2018   Procedure: TOTAL KNEE BILATERAL;  Surgeon: Frederik Pear, MD;  Location: [facility];  Service: Orthopedics;  Laterality: Bilateral;  . UPPER GASTROINTESTINAL ENDOSCOPY      Current Medications: Outpatient Medications Prior to Visit  Medication Sig Dispense Refill  .  amLODipine (NORVASC) 10 MG tablet Take 1 tablet (10 mg total) by mouth daily. 90 tablet 3  . aspirin EC 81 MG tablet Take 1 tablet (81 mg total) by mouth 2 (two) times daily. 60 tablet 0  . B Complex Vitamins (B COMPLEX 1 PO) Take 1 tablet by mouth daily.     . Biotin 5000 MCG CAPS Take 5,000 mcg by mouth daily.    . Calcium Carb-Cholecalciferol (CALCIUM 600+D) 600-800 MG-UNIT TABS Take 1 tablet by mouth daily.    . cholecalciferol (VITAMIN D3) 25 MCG (1000 UT) tablet Take 1,000 Units by mouth daily.    . cyclobenzaprine (FLEXERIL) 10 MG tablet Take 10 mg by mouth at bedtime. PRN    . DULoxetine (CYMBALTA) 60 MG capsule Take 1 capsule (60 mg total) by mouth 2 (two) times daily. 180 capsule 3  . Flaxseed, Linseed, (FLAX SEED OIL PO) Take 1,400 mg by mouth daily.    . Garlic (GARLIQUE PO) Take 1 tablet by mouth daily.    . hydrochlorothiazide (MICROZIDE) 12.5 MG capsule Take 1 capsule (12.5 mg total) by mouth daily. 90 capsule 3  . meloxicam (MOBIC) 15 MG tablet Take 15 mg by mouth daily.    . nitrofurantoin, macrocrystal-monohydrate, (MACROBID) 100 MG capsule Take 1 capsule daily as needed following intercourse to prevent UTI (Patient taking differently: Take 100 mg by mouth See admin instructions. Take 100 mg  daily as needed following intercourse to prevent UTI) 30 capsule 2  . Omega-3 1000 MG CAPS Take 1,000 mg by mouth daily.    Marland Kitchen omeprazole (PRILOSEC) 20 MG capsule Take 1 capsule (20 mg total) by mouth daily. 90 capsule 3  . TURMERIC PO Take 1 capsule by mouth daily.    . vitamin C (ASCORBIC ACID) 500 MG tablet Take 500 mg by mouth daily.     No facility-administered medications prior to visit.      Allergies:   Aspartame and Demerol [meperidine]   Social History   Socioeconomic History  . Marital status: Married    Spouse name: Not on file  . Number of children: 2  . Years of education: Not on file  . Highest education level: Not on file  Occupational History  . Occupation: Hydrographic surveyor: [employer]    Reynolds: CICU nurse  Social Needs  . Financial resource strain: Not on file  . Food insecurity:    Worry: Not on file    Inability: Not on file  . Transportation needs:    Medical: Not on file    Non-medical: Not on file  Tobacco Use  . Smoking status: Never Smoker  . Smokeless tobacco: Never Used  Substance and Sexual Activity  . Alcohol use: Yes  Reynolds: occ  . Drug use: No  . Sexual activity: Not on file  Lifestyle  . Physical activity:    Days per week: Not on file    Minutes per session: Not on file  . Stress: Not on file  Relationships  . Social connections:    Talks on phone: Not on file    Gets together: Not on file    Attends religious service: Not on file    Active member of club or organization: Not on file    Attends meetings of clubs or organizations: Not on file    Relationship status: Not on file  Other Topics Concern  . Not on file  Social History Narrative  . Not on file     Socially she was born in [place].  She had attended [employer] as well as [employer].  She initially had a degree in recreation therapy.  She has lived in [place], and ultimately worked in [place], [place], [place] in [place] before moving back to [place].  She is divorced x2 and has been married for 13 years.  She has 2 children and 6 step grandchildren.  She is a Equities trader.  There is no tobacco history.  She rarely drinks a glass of wine.  She does not routinely exercise.  Family History:  The patient's  family history includes Colon cancer in her paternal uncle; Diabetes in her father; Heart disease in her father and mother; Irritable bowel syndrome in her father; Kidney disease in her father; Liver cancer in her paternal uncle; Mitral valve prolapse in her mother; Pancreatic cancer in her maternal aunt; Prostate cancer in her father.   Additional Family history is notable that her mother had rheumatic fever and underwent  mitral valve replacement and also has a history of atrial fibrillation.  She is 72.  Her father has undergone 2 previous CABG revascularization surgeries one in his 48s with the second in his 67s.  He died at age 73 and had a history of prostate CA.  She has 2 brothers one was recently diagnosed with hypertension and another has thyroid cancer and melanoma.  ROS General: Negative; No fevers, chills, or night sweats; obesity HEENT: Negative; No changes in vision or hearing, sinus congestion, difficulty swallowing Pulmonary: Negative; No cough, wheezing, shortness of breath, hemoptysis Cardiovascular: Negative; No chest pain, presyncope, syncope, palpitations GI: GERD GU: Negative; No dysuria, hematuria, or difficulty voiding Musculoskeletal: Bilateral knee replacement Hematologic/Oncology: Negative; no easy bruising, bleeding Rheumatologic: History of fibromyalgia Endocrine: Negative; no heat/cold intolerance; no diabetes Neuro: Negative; no changes in balance, headaches Skin: Negative; No rashes or skin lesions Psychiatric: Negative; No behavioral problems, depression Sleep: Negative; No snoring, daytime sleepiness, hypersomnolence, bruxism, restless legs, hypnogognic hallucinations, no cataplexy Other comprehensive 14 point system review is negative.   PHYSICAL EXAM:   VS:  BP 118/82   Pulse 73   Ht '5\' 6"'  (1.676 m)   Wt 252 lb (114.3 kg)   LMP 06/09/2015 (Approximate)   BMI 40.67 kg/m     Repeat blood pressure by me 120/80  Wt Readings from Last 3 Encounters:  01/02/19 252 lb (114.3 kg)  12/28/18 254 lb (115.2 kg)  12/21/18 245 lb (111.1 kg)    General: Alert, oriented, no distress.  Skin: normal turgor, no rashes, warm and dry HEENT: Bilateral xanthelasmas; normocephalic, atraumatic. Pupils equal round and reactive to light; sclera anicteric; extraocular muscles intact; Fundi no hemorrhages or exudates.  Discs flat. Nose without nasal septal hypertrophy  Mouth/Parynx benign;  Mallinpatti scale 3 Neck: No JVD, no carotid bruits; normal carotid upstroke Lungs: clear to ausculatation and percussion; no wheezing or rales Chest wall: Positive chest wall tenderness to palpation along the left costochondral region and also mildly across the right costochondral region.  She was unaware of this tenderness prior to my palpation. Heart: PMI not displaced, RRR, s1 s2 normal, 1/6 systolic murmur, no diastolic murmur, no rubs, gallops, thrills, or heaves Abdomen: Moderate central adiposity soft, nontender; no hepatosplenomehaly, BS+; abdominal aorta nontender and not dilated by palpation. Back: no CVA tenderness Pulses 2+ Musculoskeletal: full range of motion, normal strength, no joint deformities Extremities: no clubbing cyanosis or edema, Homan's sign negative  Neurologic: grossly nonfocal; Cranial nerves grossly wnl Psychologic: Normal mood and affect   Studies/Labs Reviewed:   EKG:  EKG is  ordered today.  ECG (independently read by me): Normal sinus rhythm at 73 bpm.  Nonspecific T wave abnormality in V1-2.  Normal intervals.  No ectopy.  Recent Labs: BMP Latest Ref Rng & Units 12/21/2018 09/28/2018 09/26/2018  Glucose 70 - 99 mg/dL 99 183(H) 86  BUN 6 - 20 mg/dL 23(H) 20 19  Creatinine 0.44 - 1.00 mg/dL 0.85 0.70 1.11(H)  Sodium 135 - 145 mmol/L 139 139 143  Potassium 3.5 - 5.1 mmol/L 3.9 4.2 4.5  Chloride 98 - 111 mmol/L 107 105 106  CO2 22 - 32 mmol/L '22 24 30  ' Calcium 8.9 - 10.3 mg/dL 9.2 8.5(L) 9.1     Hepatic Function Latest Ref Rng & Units 12/21/2018 04/06/2018 02/20/2018  Total Protein 6.5 - 8.1 g/dL 6.6 7.3 7.3  Albumin 3.5 - 5.0 g/dL 3.7 4.3 4.0  AST 15 - 41 U/L '21 17 22  ' ALT 0 - 44 U/L '15 18 19  ' Alk Phosphatase 38 - 126 U/L 64 73 75  Total Bilirubin 0.3 - 1.2 mg/dL 0.8 0.9 1.0  Bilirubin, Direct 0.0 - 0.2 mg/dL 0.1 - -    CBC Latest Ref Rng & Units 12/21/2018 09/28/2018 09/26/2018  WBC 4.0 - 10.5 K/uL 8.2 16.3(H) 6.5  Hemoglobin 12.0 - 15.0 g/dL  12.8 11.6(L) 14.6  Hematocrit 36.0 - 46.0 % 42.1 36.6 45.9  Platelets 150 - 400 K/uL 342 230 274   Lab Results  Component Value Date   MCV 84.2 12/21/2018   MCV 91.5 09/28/2018   MCV 90.0 09/26/2018   Lab Results  Component Value Date   TSH 4.04 04/06/2018   Lab Results  Component Value Date   HGBA1C 6.0 04/06/2018     BNP No results found for: BNP  ProBNP No results found for: PROBNP   Lipid Panel     Component Value Date/Time   CHOL 209 (H) 12/28/2018 1302   TRIG 256.0 (H) 12/28/2018 1302   HDL 51.10 12/28/2018 1302   CHOLHDL 4 12/28/2018 1302   VLDL 51.2 (H) 12/28/2018 1302   LDLDIRECT 119.0 12/28/2018 1302     RADIOLOGY: Dg Chest 2 View  Result Date: 12/21/2018 CLINICAL DATA:  Chest pain EXAM: CHEST - 2 VIEW COMPARISON:  September 26, 2018 FINDINGS: There is minimal scarring in the left base. The lungs elsewhere are clear. Heart size and pulmonary vascularity are normal. No adenopathy. No bone lesions. No pneumothorax. IMPRESSION: Minimal scarring left base. No edema or consolidation. Stable cardiac silhouette. Electronically Signed   By: Lowella Grip III M.D.   On: 12/21/2018 11:29   Ct Angio Chest Pe W And/or Wo Contrast  Result Date: 12/21/2018 CLINICAL DATA:  PE suspected EXAM: CT ANGIOGRAPHY CHEST WITH CONTRAST TECHNIQUE: Multidetector CT imaging of the chest was performed using the standard protocol during bolus administration of intravenous contrast. Multiplanar CT image reconstructions and MIPs were obtained to evaluate the vascular anatomy. CONTRAST:  <See Chart> ISOVUE-370 IOPAMIDOL (ISOVUE-370) INJECTION 76% COMPARISON:  Same day chest radiograph FINDINGS: Cardiovascular: Satisfactory opacification of the pulmonary arteries to the segmental level. No evidence of pulmonary embolism. Normal heart size. No pericardial effusion. Mediastinum/Nodes: No enlarged mediastinal, hilar, or axillary lymph nodes. Thyroid gland, trachea, and esophagus demonstrate no  significant findings. Lungs/Pleura: Bibasilar scarring. Minimal partial atelectasis on expiratory phase examination. No pleural effusion or pneumothorax. Upper Abdomen: No acute abnormality. Musculoskeletal: No chest wall abnormality. No acute or significant osseous findings. Review of the MIP images confirms the above findings. IMPRESSION: 1.  Negative examination for pulmonary embolism. 2.  Bibasilar scarring. Electronically Signed   By: Eddie Candle M.D.   On: 12/21/2018 16:48     Additional studies/ records that were reviewed today include:  I reviewed the patient's recent hospital records, as well as her office evaluation with Dr. Janett Billow Copland  ASSESSMENT:    1. Chest pain, unspecified type   2. Mixed hyperlipidemia   3. Xanthelasma of eyelid, bilateral   4. Fibromyalgia   5. Gastroesophageal reflux disease without esophagitis   6. Medication management   7. 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  Her chest pain is atypical for coronary obstructive disease.  She has noticed a constant mild sensation and on physical exam she has definite chest wall tenderness to palpation suggesting a musculoskeletal etiology.  She denies any clear-cut exertional precipitation to her chest discomfort and she denies any significant exertional dyspnea.  However, if she is working very intensely at times she does note some shortness of breath.  She is recently gained approximately 12 pounds since undergoing bilateral knee surgery in November 2019 but since returning to work on November 22, 2018 she typically walks between 8000-10,000 steps while at work.  He has a longstanding history of GERD for which she has  been on omeprazole for many years and recently also has noticed some increased belching.  She was also told in the past of having borderline increased hemoglobin A1c at 5.8 and 5.9.  I reviewed her ER records.  A chest CT was negative for PE.  Her d-dimer was mildly positive.  On exam she does not have any palpable cords.  There is no lower extremity swelling.  Homans sign is negative.  I had a long discussion with her.  She clearly has significant bilateral xanthomas on exam and significant history of mixed hyperlipidemia and I suspect has had increased small LDL sub-particles.  I have recommended aggressive therapy of her mixed hyperlipidemia.  I have recommended institution of rosuvastatin 20 mg and I also discussed with her the recent reduce it trial data with Vascepa which is the only omega-3 fatty acid that is 100% EPA and has been shown to have positive cardiovascular outcome benefit.  I recommending she undergo a 2D echo Doppler study to evaluate systolic and diastolic function as well as her valvular architecture.  I have suggested a trial of nonsteroidal anti-inflammatory medication to see if  this can improve some of her constant chest pain which most likely is musculoskeletal in etiology.  Recent laboratory was reviewed and renal function was stable.  To further evaluate potential ischemic etiology I have recommended she undergo coronary CT angiogram for further evaluation.  I am concerned that a exercise nuclear study may be fraught with attenuation abnormalities due to her heart size.  2 months I am recommending she undergo comprehensive metabolic panel, lipid studies as well as a TSH level.  I will see her in follow-up of the above studies and further recommendations will be made at that time.   Medication Adjustments/Labs and Tests Ordered: Current medicines are reviewed at length with the patient today.  Concerns regarding medicines are outlined above.  Medication changes, Labs and Tests ordered  today are listed in the Patient Instructions below. Patient Instructions  Medication Instructions:  START CRESTOR 20MG AT BEDTIME START VASCEPA 2GR(2CAPS) TWICE DAILY If you need a refill on your cardiac medications before your next appointment, please call your pharmacy.  Labwork: FASTING LIPID,CMET AND TSH HERE IN OUR OFFICE AT [facility]  You will need to fast. DO NOT EAT OR DRINK PAST MIDNIGHT.       Testing/Procedures: Echocardiogram - Your physician has requested that you have an echocardiogram. Echocardiography is a painless test that uses sound waves to create images of your heart. It provides your doctor with information about the size and shape of your heart and how well your heart's chambers and valves are working. This procedure takes approximately one hour. There are no restrictions for this procedure. This will be performed at our [facility] location - [address], [address].  Follow-Up: You will need a follow up apointment in 2 months ON 03-20-2019 '@240PM' .  You may see Shelva Majestic, MD  or one of the following Advanced Practice Providers on your designated Care Team: Almyra Deforest, PA-C  Fabian Sharp, PA-C At [facility], you and your health needs are our priority.  As part of our continuing mission to provide you with exceptional heart care, we have created designated Provider Care Teams.  These Care Teams include your primary Cardiologist (physician) and Advanced Practice Providers (APPs -  Physician Assistants and Nurse Practitioners) who all work together to provide you with the care you need, when you need it.  Thank you for choosing [facility]!!     Please arrive at the [facility] main entrance of [facility] at    AM (30-45 minutes prior to test start time)  [facility] [place], [place] [address] [phone]  Proceed to the [facility] Radiology Department (First Floor).  Please follow these instructions carefully  (unless otherwise directed):  Hold all erectile dysfunction medications at least 48 hours prior to test.  On the Night Before the Test: . Be sure to Drink plenty of water. . Do not consume any caffeinated/decaffeinated beverages or chocolate 12 hours prior to your test. . Do not take any antihistamines 12 hours prior to your test.  On the Day of the Test: . Drink plenty of water. Do not drink any water within one hour of the test. . Do not eat any food 4 hours prior to the test. . You may take your regular medications prior to the test.  . Take metoprolol (Lopressor) two hours prior to test. . HOLD Hydrochlorothiazide morning of the test.      -Take metoprolol (Lopressor) 2 hours prior to test (  if applicable).       After the Test: . Drink plenty of water. . After receiving IV contrast, you may experience a mild flushed feeling. This is normal. . On occasion, you may experience a mild rash up to 24 hours after the test. This is not dangerous. If this occurs, you can take Benadryl 25 mg and increase your fluid intake. . If you experience trouble breathing, this can be serious. If it is severe call 911 IMMEDIATELY. If it is mild, please call our office. . If you take any of these medications: Glipizide/Metformin, Avandament, Glucavance, please do not take 48 hours after completing test.      Signed, Shelva Majestic, MD  01/02/2019 6:05 PM    [place] [address] [place], [place], [place]  [address] Phone: [phone]

## 2019-01-03 ENCOUNTER — Telehealth: Payer: Self-pay | Admitting: *Deleted

## 2019-01-03 ENCOUNTER — Encounter: Payer: Self-pay | Admitting: Family Medicine

## 2019-01-03 DIAGNOSIS — F419 Anxiety disorder, unspecified: Secondary | ICD-10-CM

## 2019-01-03 MED ORDER — ALPRAZOLAM 0.25 MG PO TABS: 0.2500 mg | ORAL_TABLET | Freq: Two times a day (BID) | ORAL | 0 refills | Status: DC | PRN

## 2019-01-03 MED FILL — METOPROLOL TARTRATE 100 MG: 100 | 1 days supply | Qty: 1 | Fill #0

## 2019-01-03 NOTE — Telephone Encounter (Signed)
Left message for patient to call and schedule Echo ordered by Dr. Claiborne Billings

## 2019-01-04 MED FILL — ALPRAZolam 0.25 MG TABS: 0.25 | 10 days supply | Qty: 20 | Fill #0

## 2019-01-05 NOTE — Therapy (Signed)
[place], [place], [address] Phone: [phone]   Fax:  [phone]  Physical Therapy Treatment/Discharge Summary  Patient Details  Name: Dawn Reynolds MRN: [id number] Date of Birth: May 22, 1966 Referring Provider (PT): Frederik Pear, MD   Encounter Date: 11/27/2018    Past Medical History:  Diagnosis Date  . Anemia    hx of  . Arthritis   . 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 Chicken pox   . Depression    hx of in past situational  . Fibromyalgia   . GERD (gastroesophageal reflux disease)   . Headache    hx of migraines  . History of kidney stones    1996  . Hypertension     Past Surgical History:  Procedure Laterality Date  . ABDOMINAL HYSTERECTOMY    . CHOLECYSTECTOMY     03-31-18 [name]  . CHOLECYSTECTOMY N/A 03/31/2018   Procedure: LAPAROSCOPIC CHOLECYSTECTOMY;  Surgeon: Ralene Ok, MD;  Location: [facility];  Service: General;  Laterality: N/A;  . COLONOSCOPY    . endoplantar fasciotomy    . GALLBLADDER SURGERY  03/31/2018  . KNEE ARTHROSCOPY     left knee 2012  . LAPAROSCOPIC CHOLECYSTECTOMY    . REPLACEMENT TOTAL KNEE BILATERAL Bilateral 09/27/2018  . TONSILLECTOMY     1999  . TOTAL KNEE ARTHROPLASTY Bilateral 09/27/2018   Procedure: TOTAL KNEE BILATERAL;  Surgeon: Frederik Pear, MD;  Location: [facility];  Service: Orthopedics;  Laterality: Bilateral;  . UPPER GASTROINTESTINAL ENDOSCOPY      There were no vitals filed for this visit.                              PT Short Term Goals - 11/20/18 1249      PT SHORT TERM GOAL #1   Title  Independent with HEP    Baseline  met with initial HEP    Time  2    Period  Weeks    Status  Achieved      PT SHORT TERM GOAL #2   Title  Increase bilat. knee flexion AROM 5-10 deg to improve ability for transfers from low chairs     Baseline  Rt 130, Lt 125    Time  2    Period  Weeks    Status  Achieved         PT Long Term Goals - 11/20/18 1250      PT LONG TERM GOAL #1   Title  Independent with community level ambulation without AD    Baseline  now able without AD    Time  4    Period  Weeks    Status  Achieved      PT LONG TERM GOAL #2   Title  Bilat. 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 knee flexion AROM 120 deg to improve ability to descend stairs, bend knees for dressing    Baseline  Rt 130 deg, Lt 125 deg    Time  4    Period  Weeks  Status  Achieved      PT LONG TERM GOAL #4   Title  Right knee extension to neutral for improve heel strike with gait, improved ability upright stance    Baseline  met, able to achive neutral    Time  4    Period  Weeks    Status  On-going              Patient will benefit from skilled therapeutic intervention in order to improve the following deficits and impairments:  Abnormal gait, Pain, Decreased mobility, Decreased activity tolerance, Decreased endurance, Decreased range of motion, Decreased strength, Obesity, Decreased balance, Difficulty walking  Visit Diagnosis: Right knee pain, unspecified chronicity  Left knee pain, unspecified chronicity  Muscle weakness (generalized)  Stiffness of right knee, not elsewhere classified  Stiffness of left knee, not elsewhere classified  Difficulty in walking, not elsewhere classified     Problem List Patient Active Problem List   Diagnosis Date Noted  . S/P TKR (total knee replacement), bilateral 09/27/2018  . Bilateral primary osteoarthritis of knee 09/26/2018  . Obesity (BMI 35.0-39.9 without comorbidity) 04/06/2018  . High triglycerides 04/06/2018  . Osteoarthritis of left knee 12/21/2017  . Chest pain  07/09/2017  . Fibromyalgia 07/09/2017  . Hypertension 07/09/2017  . Abnormal weight gain 01/31/2014  . Empty sella syndrome ([place]) 01/31/2014  . Other abnormal glucose 01/31/2014  . Neuropathy, peripheral 12/25/2013     PHYSICAL THERAPY DISCHARGE SUMMARY  Visits from Start of Care: 14  Current functional level related to goals / functional outcomes: Pt. 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 No further therapy planned at this time.   Remaining deficits: NA   Education / Equipment: NA Plan: Patient agrees to discharge.  Patient goals were met. Patient is being discharged due to not returning since the last visit.  ?????          Beaulah Dinning, PT, DPT 01/05/19 9:02 AM   [facility] [address] [place], [place], [address] Phone: [phone]   Fax:  [phone]  Name: Dawn Reynolds MRN: [id number] Date of Birth: 08/23/1966

## 2019-01-08 ENCOUNTER — Other Ambulatory Visit (HOSPITAL_COMMUNITY): Payer: Self-pay

## 2019-01-10 ENCOUNTER — Other Ambulatory Visit (HOSPITAL_COMMUNITY): Payer: Self-pay

## 2019-01-22 ENCOUNTER — Telehealth: Payer: 59 | Admitting: Physician Assistant

## 2019-01-22 DIAGNOSIS — J040 Acute laryngitis: Secondary | ICD-10-CM | POA: Diagnosis not present

## 2019-01-22 DIAGNOSIS — J069 Acute upper respiratory infection, unspecified: Secondary | ICD-10-CM

## 2019-01-22 MED ORDER — BENZONATATE 100 MG PO CAPS: 100.0000 mg | ORAL_CAPSULE | Freq: Three times a day (TID) | ORAL | 0 refills | Status: DC | PRN

## 2019-01-22 MED ORDER — ALBUTEROL SULFATE HFA 108 (90 BASE) MCG/ACT IN AERS: 2.0000 | INHALATION_SPRAY | Freq: Four times a day (QID) | RESPIRATORY_TRACT | 0 refills | Status: DC | PRN

## 2019-01-22 MED ORDER — AMOXICILLIN 500 MG PO CAPS: 500.0000 mg | ORAL_CAPSULE | Freq: Two times a day (BID) | ORAL | 0 refills | Status: DC

## 2019-01-22 MED FILL — VENTOLIN HFA 90 MCG INHALER: 108 (90 BAS | 25 days supply | Qty: 18 | Fill #0

## 2019-01-22 MED FILL — AMOXICILLIN 500 MG CAPSULE: 500 | 7 days supply | Qty: 14 | Fill #0

## 2019-01-22 MED FILL — BENZONATATE 100 MG CAPS: 100 | 7 days supply | Qty: 20 | Fill #0

## 2019-01-22 NOTE — Progress Notes (Signed)
We are sorry that you are not feeling well.  Here is how we plan to help!  Based on your presentation I believe you most likely have an upper respiratory infection   I have prescribed   A prescription cough medication called Tessalon Perles 100mg . You may take 1-2 capsules every 8 hours as needed for your cough.   I will also prescribe an albuterol inhlaer, use 2 puffs in the mouth ever 6 hour PRN for first 24 hours, then use only as needed for the cough.   Also, I have prescribed an antibiotic for your throat and changes to your voice. Vocal changes can be caused by viruses of bacteria, or from a persistent cough. If you vocal changes or any soreness in the throat persists or worsen, take the prescribed antibiotic.     From your responses in the eVisit questionnaire you describe inflammation in the upper respiratory tract which is causing a significant cough.  This is commonly called Bronchitis and has four common causes:    Allergies  Viral Infections  Acid Reflux  Bacterial Infection Allergies, viruses and acid reflux are treated by controlling symptoms or eliminating the cause. An example might be a cough caused by taking certain blood pressure medications. You stop the cough by changing the medication. Another example might be a cough caused by acid reflux. Controlling the reflux helps control the cough.  USE OF BRONCHODILATOR ("RESCUE") INHALERS: There is a risk from using your bronchodilator too frequently.  The risk is that over-reliance on a medication which only relaxes the muscles surrounding the breathing tubes can reduce the effectiveness of medications prescribed to reduce swelling and congestion of the tubes themselves.  Although you feel brief relief from the bronchodilator inhaler, your asthma may actually be worsening with the tubes becoming more swollen and filled with mucus.  This can delay other crucial treatments, such as oral steroid medications. If you need to use a  bronchodilator inhaler daily, several times per day, you should discuss this with your provider.  There are probably better treatments that could be used to keep your asthma under control.     HOME CARE . Only take medications as instructed by your medical team. . Complete the entire course of an antibiotic. . Drink plenty of fluids and get plenty of rest. . Avoid close contacts especially the very young and the elderly . Cover your mouth if you cough or cough into your sleeve. . Always remember to wash your hands . A steam or ultrasonic humidifier can help congestion.   GET HELP RIGHT AWAY IF: . You develop worsening fever. . You become short of breath . You cough up blood. . Your symptoms persist after you have completed your treatment plan MAKE SURE YOU   Understand these instructions.  Will watch your condition.  Will get help right away if you are not doing well or get worse.  Your e-visit answers were reviewed by a board certified advanced clinical practitioner to complete your personal care plan.  Depending on the condition, your plan could have included both over the counter or prescription medications. If there is a problem please reply  once you have received a response from your provider. Your safety is important to [place].  If you have drug allergies check your prescription carefully.    You can use MyChart to ask questions about today's visit, request a non-urgent call back, or ask for a work or school excuse for 24 hours related to this  e-Visit. If it has been greater than 24 hours you will need to follow up with your provider, or enter a new e-Visit to address those concerns. You will get an e-mail in the next two days asking about your experience.  I hope that your e-visit has been valuable and will speed your recovery. Thank you for using e-visits.  I spent 7 min to complete this note- SO

## 2019-01-23 ENCOUNTER — Telehealth (HOSPITAL_COMMUNITY): Payer: Self-pay | Admitting: Emergency Medicine

## 2019-01-23 NOTE — Telephone Encounter (Signed)
Left message on voicemail with name and callback number Jelitza Manninen RN Navigator Cardiac Imaging [facility] [phone] Office [phone] Cell  

## 2019-01-24 ENCOUNTER — Encounter: Payer: Self-pay | Admitting: Family Medicine

## 2019-01-24 ENCOUNTER — Telehealth (HOSPITAL_COMMUNITY): Payer: Self-pay | Admitting: Emergency Medicine

## 2019-01-24 NOTE — Telephone Encounter (Signed)
Pt returned phone call-- pt confirmed receipt of mychart message with cardiac CT instructions.  pt adamant that she not reschedule her test. 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 Pt very frustrated at whole situation, requesting to keep appt for coronary CTA as they take a while to schedule. Insists she will wear a mask throughout procudure.   Marchia Bond RN Navigator Cardiac Imaging [facility] [phone] Office  [phone] Cell

## 2019-01-25 ENCOUNTER — Ambulatory Visit: Payer: 59

## 2019-01-25 ENCOUNTER — Other Ambulatory Visit: Payer: Self-pay

## 2019-01-25 ENCOUNTER — Ambulatory Visit (HOSPITAL_COMMUNITY): Admission: RE | Admit: 2019-01-25 | Payer: 59 | Source: Ambulatory Visit

## 2019-01-25 ENCOUNTER — Ambulatory Visit: Payer: 59 | Admitting: *Deleted

## 2019-01-25 DIAGNOSIS — R6889 Other general symptoms and signs: Secondary | ICD-10-CM

## 2019-01-25 LAB — INFLUENZA PANEL BY PCR (TYPE A & B)
Influenza A By PCR: NEGATIVE
Influenza B By PCR: NEGATIVE

## 2019-01-25 NOTE — Progress Notes (Unsigned)
Patient here for flu nasopharyngeal swab. She had heavy nasal secretions, needed to clear her passages several times before swabbing.  Landis Gandy, RN

## 2019-01-29 ENCOUNTER — Other Ambulatory Visit: Payer: Self-pay | Admitting: Family Medicine

## 2019-01-29 DIAGNOSIS — F419 Anxiety disorder, unspecified: Secondary | ICD-10-CM

## 2019-01-29 MED ORDER — ALPRAZOLAM 0.25 MG PO TABS: 0.2500 mg | ORAL_TABLET | Freq: Two times a day (BID) | ORAL | 0 refills | Status: DC | PRN

## 2019-01-29 MED FILL — OMEPRAZOLE 20 MG CPDR: 20 | 90 days supply | Qty: 90 | Fill #3

## 2019-01-30 MED FILL — ROSUVASTATIN CALCIUM 20 MG: 20 | 30 days supply | Qty: 30 | Fill #1 | Status: TO

## 2019-01-30 MED FILL — MELOXICAM 15 MG TABLET: 15 | 90 days supply | Qty: 90 | Fill #3

## 2019-01-30 MED FILL — DULoxetine HCL 60 MG CPEP: 60 | 90 days supply | Qty: 180 | Fill #3

## 2019-01-30 MED FILL — AMLODIPINE BESYLATE 10 MG T: 10 | 90 days supply | Qty: 90 | Fill #3

## 2019-01-30 MED FILL — VASCEPA 1 GM CAPSULE: 1 | 30 days supply | Qty: 120 | Fill #1 | Status: TO

## 2019-01-30 MED FILL — HYDROCHLOROTHIAZIDE 12.5 MG: 12.5 | 90 days supply | Qty: 90 | Fill #3

## 2019-01-30 MED FILL — ALPRAZolam 0.25 MG TABS: 0.25 | 15 days supply | Qty: 30 | Fill #0

## 2019-02-14 MED FILL — CYCLOBENZAPRINE HCL 10 MG T: 10 | 30 days supply | Qty: 30 | Fill #0

## 2019-02-14 MED FILL — traMADol HCL 50 MG TABS: 50 | 30 days supply | Qty: 60 | Fill #0

## 2019-02-20 ENCOUNTER — Encounter: Payer: Self-pay | Admitting: Internal Medicine

## 2019-02-20 ENCOUNTER — Ambulatory Visit (INDEPENDENT_AMBULATORY_CARE_PROVIDER_SITE_OTHER): Payer: 59 | Admitting: Internal Medicine

## 2019-02-20 ENCOUNTER — Other Ambulatory Visit: Payer: Self-pay

## 2019-02-20 DIAGNOSIS — J988 Other specified respiratory disorders: Secondary | ICD-10-CM | POA: Diagnosis not present

## 2019-02-20 DIAGNOSIS — Z03818 Encounter for observation for suspected exposure to other biological agents ruled out: Secondary | ICD-10-CM

## 2019-02-20 NOTE — Progress Notes (Signed)
Subjective:    Patient ID: Dawn Reynolds, female    DOB: 05-May-1966, 53 y.o.   MRN: [id number]  DOS:  02/20/2019 Type of visit - description: Virtual Visit via Video Note  I connected with@ on 02/21/19 at  1:00 PM EDT by a video enabled telemedicine application and verified that I am speaking with the correct person using two identifiers.   THIS ENCOUNTER IS A VIRTUAL VISIT DUE TO COVID-19 - PATIENT WAS NOT SEEN IN THE OFFICE. PATIENT HAS CONSENTED TO VIRTUAL VISIT / TELEMEDICINE VISIT   Location of patient: home  Location of provider: office  I discussed the limitations of evaluation and management by telemedicine and the availability of in person appointments. The patient expressed understanding and agreed to proceed.  History of Present Illness: Acute visit The patient is a nurse at [employer]. She  started to feel unwell 2 days ago: Headaches, unusual aches, she had some petechia at the feet (now they are gone, denies gum bleeding or other unusual bleeding). Her aches were mostly   located around the shoulders and different from her fibromyalgia pains  She has been checking her temperature regularly she had no fever but she did have some chills. The headache is mild and is taking Tylenol regularly for it. Today overall feels a slightly better but is not completely well. She has noticed a decrease in her sense of taste and smell.  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 Anemia    hx of  . Arthritis   . 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 Chicken pox   . Depression    hx of in past situational  . Fibromyalgia   . GERD (gastroesophageal reflux disease)   . Headache    hx of migraines  . History of kidney stones    1996  . Hypertension     Past Surgical History:   Procedure Laterality Date  . ABDOMINAL HYSTERECTOMY    . CHOLECYSTECTOMY     03-31-18 [name]  . CHOLECYSTECTOMY N/A 03/31/2018   Procedure: LAPAROSCOPIC CHOLECYSTECTOMY;  Surgeon: Ralene Ok, MD;  Location: [facility];  Service: General;  Laterality: N/A;  . COLONOSCOPY    . endoplantar fasciotomy    . GALLBLADDER SURGERY  03/31/2018  . KNEE ARTHROSCOPY     left knee 2012  . LAPAROSCOPIC CHOLECYSTECTOMY    . REPLACEMENT TOTAL KNEE BILATERAL Bilateral 09/27/2018  . TONSILLECTOMY     1999  . TOTAL KNEE ARTHROPLASTY Bilateral 09/27/2018   Procedure: TOTAL KNEE BILATERAL;  Surgeon: Frederik Pear, MD;  Location: [facility];  Service: Orthopedics;  Laterality: Bilateral;  . UPPER GASTROINTESTINAL ENDOSCOPY      Social History   Socioeconomic History  . Marital status: Married    Spouse name: Not on file  . Number of children: 2  . Years of education: Not on file  . Highest education level: Not on file  Occupational History  . Occupation: Optician, dispensing: [employer]    Comment: CICU nurse  Social Needs  . Financial resource strain: Not on file  . Food insecurity:    Worry: Not on file    Inability: Not on file  . Transportation needs:    Medical: Not on file    Non-medical:  Not on file  Tobacco Use  . Smoking status: Never Smoker  . Smokeless tobacco: Never Used  Substance and Sexual Activity  . Alcohol use: Yes    Comment: occ  . Drug use: No  . Sexual activity: Not on file  Lifestyle  . Physical activity:    Days per week: Not on file    Minutes per session: Not on file  . Stress: Not on file  Relationships  . Social connections:    Talks on phone: Not on file    Gets together: Not on file    Attends religious service: Not on file    Active member of club or organization: Not on file    Attends meetings of clubs or organizations: Not on file    Relationship status: Not on file  . Intimate partner violence:    Fear of current or ex partner: Not on file     Emotionally abused: Not on file    Physically abused: Not on file    Forced sexual activity: Not on file  Other Topics Concern  . Not on file  Social History Narrative  . Not on file      Allergies as of 02/20/2019      Reactions   Aspartame Other (See Comments)   Headaches.   Demerol [meperidine] Nausea And Vomiting      Medication List       Accurate as of February 20, 2019 11:59 PM. Always use your most recent med list.        albuterol 108 (90 Base) MCG/ACT inhaler Commonly known as:  PROVENTIL HFA;VENTOLIN HFA Inhale 2 puffs into the lungs every 6 (six) hours as needed for wheezing or shortness of breath.   ALPRAZolam 0.25 MG tablet Commonly known as:  XANAX Take 1 tablet (0.25 mg total) by mouth 2 (two) times daily as needed for anxiety.   amLODipine 10 MG tablet Commonly known as:  NORVASC Take 1 tablet (10 mg total) by mouth daily.   aspirin EC 81 MG tablet Take 1 tablet (81 mg total) by mouth 2 (two) times daily.   B COMPLEX 1 PO Take 1 tablet by mouth daily.   Biotin 5000 MCG Caps Take 5,000 mcg by mouth daily.   Calcium 600+D 600-800 MG-UNIT Tabs Generic drug:  Calcium Carb-Cholecalciferol Take 1 tablet by mouth daily.   cholecalciferol 25 MCG (1000 UT) tablet Commonly known as:  VITAMIN D3 Take 1,000 Units by mouth daily.   cyclobenzaprine 10 MG tablet Commonly known as:  FLEXERIL Take 10 mg by mouth at bedtime. PRN   DULoxetine 60 MG capsule Commonly known as:  CYMBALTA Take 1 capsule (60 mg total) by mouth 2 (two) times daily.   FLAX SEED OIL PO Take 1,400 mg by mouth daily.   GARLIQUE PO Take 1 tablet by mouth daily.   hydrochlorothiazide 12.5 MG capsule Commonly known as:  MICROZIDE Take 1 capsule (12.5 mg total) by mouth daily.   Icosapent Ethyl 1 g Caps Commonly known as:  Vascepa Take 2 capsules (2 g total) by mouth 2 (two) times daily.   meloxicam 15 MG tablet Commonly known as:  MOBIC Take 15 mg by mouth daily.    metoprolol tartrate 100 MG tablet Commonly known as:  LOPRESSOR Take 1 tablet (100 mg total) by mouth once for 1 dose. DAY OF THE CT   nitrofurantoin (macrocrystal-monohydrate) 100 MG capsule Commonly known as:  Macrobid Take 1 capsule daily as needed following intercourse to  prevent UTI   Omega-3 1000 MG Caps Take 1,000 mg by mouth daily.   omeprazole 20 MG capsule Commonly known as:  PRILOSEC Take 1 capsule (20 mg total) by mouth daily.   rosuvastatin 20 MG tablet Commonly known as:  CRESTOR Take 1 tablet (20 mg total) by mouth daily.   TURMERIC PO Take 1 capsule by mouth daily.   vitamin C 500 MG tablet Commonly known as:  ASCORBIC ACID Take 500 mg by mouth daily.           Objective:   Physical Exam LMP 06/09/2015 (Approximate)  This is virtual visit via video, she is alert oriented x3, no apparent distress    Assessment    53 year old female, PMH includes hypertension, FM, DJD, neuropathy, high cholesterol presents with  Suspected COVID-19  Patient symptoms are probably related to COVID-19, she does not look toxic, my recommendations are: Continue Tylenol, increase fluid intake, watch for red flag symptoms that indicate the need to seek emergency care (high fever, severe cough, chest pain, shortness of breath). She should not return to work until she feels well, at least for 7 to 10 days, and if she develops fever, she should be without fever for at least 3 days. I will be happy to send a letter all discussed the issue with her supervisors is a nurse at the hospital. I encouraged her to look into the CDC website to learn more about this issue. I also encouraged the use of MyChart COVID-19 companion. Should carefully practice social distancing. We will send a message. Multiple questions answered to the best of my ability, face-to-face 25 minutes

## 2019-03-05 MED FILL — ROSUVASTATIN CALCIUM 20 MG: 20 | 30 days supply | Qty: 30 | Fill #0

## 2019-03-14 ENCOUNTER — Telehealth: Payer: Self-pay | Admitting: Cardiovascular Disease

## 2019-03-14 DIAGNOSIS — M62830 Muscle spasm of back: Secondary | ICD-10-CM | POA: Diagnosis not present

## 2019-03-14 DIAGNOSIS — M9901 Segmental and somatic dysfunction of cervical region: Secondary | ICD-10-CM | POA: Diagnosis not present

## 2019-03-14 DIAGNOSIS — M9902 Segmental and somatic dysfunction of thoracic region: Secondary | ICD-10-CM | POA: Diagnosis not present

## 2019-03-14 DIAGNOSIS — M5386 Other specified dorsopathies, lumbar region: Secondary | ICD-10-CM | POA: Diagnosis not present

## 2019-03-14 DIAGNOSIS — M9905 Segmental and somatic dysfunction of pelvic region: Secondary | ICD-10-CM | POA: Diagnosis not present

## 2019-03-14 DIAGNOSIS — M9903 Segmental and somatic dysfunction of lumbar region: Secondary | ICD-10-CM | POA: Diagnosis not present

## 2019-03-14 DIAGNOSIS — M531 Cervicobrachial syndrome: Secondary | ICD-10-CM | POA: Diagnosis not present

## 2019-03-14 MED FILL — VASCEPA 1 GM CAPSULE: 1 | 30 days supply | Qty: 120 | Fill #0

## 2019-03-14 NOTE — Telephone Encounter (Signed)
Spoke to pt who stated she has not been having any problems and stated she has not been able to have her testing done so agreed to reschedule current appt with Dr. Claiborne Billings on 03/20/19. Pt reschedule to 05/21/19 at 10 AM with Dr. Claiborne Billings.

## 2019-03-16 DIAGNOSIS — M5386 Other specified dorsopathies, lumbar region: Secondary | ICD-10-CM | POA: Diagnosis not present

## 2019-03-16 DIAGNOSIS — M531 Cervicobrachial syndrome: Secondary | ICD-10-CM | POA: Diagnosis not present

## 2019-03-16 DIAGNOSIS — M9901 Segmental and somatic dysfunction of cervical region: Secondary | ICD-10-CM | POA: Diagnosis not present

## 2019-03-16 DIAGNOSIS — M62830 Muscle spasm of back: Secondary | ICD-10-CM | POA: Diagnosis not present

## 2019-03-16 DIAGNOSIS — M9902 Segmental and somatic dysfunction of thoracic region: Secondary | ICD-10-CM | POA: Diagnosis not present

## 2019-03-16 DIAGNOSIS — M9903 Segmental and somatic dysfunction of lumbar region: Secondary | ICD-10-CM | POA: Diagnosis not present

## 2019-03-16 DIAGNOSIS — M9905 Segmental and somatic dysfunction of pelvic region: Secondary | ICD-10-CM | POA: Diagnosis not present

## 2019-03-20 ENCOUNTER — Telehealth: Payer: 59 | Admitting: Cardiovascular Disease

## 2019-03-20 ENCOUNTER — Telehealth: Payer: Self-pay | Admitting: Cardiovascular Disease

## 2019-03-20 DIAGNOSIS — M9903 Segmental and somatic dysfunction of lumbar region: Secondary | ICD-10-CM | POA: Diagnosis not present

## 2019-03-20 DIAGNOSIS — M5386 Other specified dorsopathies, lumbar region: Secondary | ICD-10-CM | POA: Diagnosis not present

## 2019-03-20 DIAGNOSIS — M9905 Segmental and somatic dysfunction of pelvic region: Secondary | ICD-10-CM | POA: Diagnosis not present

## 2019-03-20 DIAGNOSIS — M9902 Segmental and somatic dysfunction of thoracic region: Secondary | ICD-10-CM | POA: Diagnosis not present

## 2019-03-20 DIAGNOSIS — M62830 Muscle spasm of back: Secondary | ICD-10-CM | POA: Diagnosis not present

## 2019-03-20 DIAGNOSIS — M531 Cervicobrachial syndrome: Secondary | ICD-10-CM | POA: Diagnosis not present

## 2019-03-20 DIAGNOSIS — M9901 Segmental and somatic dysfunction of cervical region: Secondary | ICD-10-CM | POA: Diagnosis not present

## 2019-03-20 NOTE — Telephone Encounter (Signed)
Left message to call and schedule ct.

## 2019-03-21 DIAGNOSIS — M9905 Segmental and somatic dysfunction of pelvic region: Secondary | ICD-10-CM | POA: Diagnosis not present

## 2019-03-21 DIAGNOSIS — M531 Cervicobrachial syndrome: Secondary | ICD-10-CM | POA: Diagnosis not present

## 2019-03-21 DIAGNOSIS — M5386 Other specified dorsopathies, lumbar region: Secondary | ICD-10-CM | POA: Diagnosis not present

## 2019-03-21 DIAGNOSIS — M62830 Muscle spasm of back: Secondary | ICD-10-CM | POA: Diagnosis not present

## 2019-03-21 DIAGNOSIS — M9901 Segmental and somatic dysfunction of cervical region: Secondary | ICD-10-CM | POA: Diagnosis not present

## 2019-03-21 DIAGNOSIS — M9902 Segmental and somatic dysfunction of thoracic region: Secondary | ICD-10-CM | POA: Diagnosis not present

## 2019-03-21 DIAGNOSIS — M9903 Segmental and somatic dysfunction of lumbar region: Secondary | ICD-10-CM | POA: Diagnosis not present

## 2019-03-21 NOTE — Telephone Encounter (Signed)
Noted. Thanks.

## 2019-03-23 DIAGNOSIS — M62830 Muscle spasm of back: Secondary | ICD-10-CM | POA: Diagnosis not present

## 2019-03-23 DIAGNOSIS — M9902 Segmental and somatic dysfunction of thoracic region: Secondary | ICD-10-CM | POA: Diagnosis not present

## 2019-03-23 DIAGNOSIS — M9901 Segmental and somatic dysfunction of cervical region: Secondary | ICD-10-CM | POA: Diagnosis not present

## 2019-03-23 DIAGNOSIS — M9905 Segmental and somatic dysfunction of pelvic region: Secondary | ICD-10-CM | POA: Diagnosis not present

## 2019-03-23 DIAGNOSIS — M5386 Other specified dorsopathies, lumbar region: Secondary | ICD-10-CM | POA: Diagnosis not present

## 2019-03-23 DIAGNOSIS — M9903 Segmental and somatic dysfunction of lumbar region: Secondary | ICD-10-CM | POA: Diagnosis not present

## 2019-03-23 DIAGNOSIS — M531 Cervicobrachial syndrome: Secondary | ICD-10-CM | POA: Diagnosis not present

## 2019-03-27 ENCOUNTER — Other Ambulatory Visit: Payer: Self-pay | Admitting: Family Medicine

## 2019-03-27 DIAGNOSIS — F419 Anxiety disorder, unspecified: Secondary | ICD-10-CM

## 2019-03-27 MED ORDER — ALPRAZOLAM 0.25 MG PO TABS: 0.2500 mg | ORAL_TABLET | Freq: Two times a day (BID) | ORAL | 0 refills | Status: DC | PRN

## 2019-03-30 ENCOUNTER — Other Ambulatory Visit: Payer: Self-pay

## 2019-03-30 DIAGNOSIS — F419 Anxiety disorder, unspecified: Secondary | ICD-10-CM

## 2019-03-30 MED ORDER — ALPRAZOLAM 0.25 MG PO TABS: 0.2500 mg | ORAL_TABLET | Freq: Two times a day (BID) | ORAL | 0 refills | Status: DC | PRN

## 2019-03-30 MED FILL — ALPRAZolam 0.25 MG TABS: 0.25 | 15 days supply | Qty: 30 | Fill #0

## 2019-04-06 ENCOUNTER — Encounter: Payer: Self-pay | Admitting: Family Medicine

## 2019-04-06 MED FILL — ROSUVASTATIN CALCIUM 20 MG: 20 | 30 days supply | Qty: 30 | Fill #1

## 2019-04-06 MED FILL — CYCLOBENZAPRINE HCL 10 MG T: 10 | 30 days supply | Qty: 30 | Fill #1

## 2019-04-07 MED FILL — VASCEPA 1 GM CAPSULE: 1 | 30 days supply | Qty: 120 | Fill #1

## 2019-04-09 NOTE — Telephone Encounter (Signed)
Spoke with pt and scheduled Virtual Visit to discuss increased pain and medication management concerns on 04/12/19 at 11:20am.

## 2019-04-10 NOTE — Progress Notes (Signed)
[facility] [address] [place], [place] [address] [phone]  Date:  04/12/2019   Name:  Dawn Reynolds   DOB:  08-26-1966   MRN:  [id number]  PCP:  Darreld Mclean, MD    Chief Complaint: No chief complaint on file.   History of Present Illness:  Dawn Reynolds Comment is a 53 y.o. very pleasant female patient who presents with the following:  Virtual visit today due to pandemic Patient location is home Provider location is office Patient and confirm with 2 factors, she gives consent for virtual visit today Her last visit was in February, at which time she had concern of chest pain.  She has history of hypertension, obesity, fibromyalgia/chronic pain, anxiety  Dawn Reynolds had recently sent me the following my chart message: I have been following with Dr Frederik Pear for my bilateral knee replacements since 09/2018. I have been taking Tramadol 50mg  PRN for pain since then  Prior to this, you were prescribing Percocet 5mg /325mg  PRN. As far as pain is concerned, my worst days are my work days. I'm trying to find a position with less than 12-14hr shifts on my feet. My pain is muscular more than knee related. I had been taking Flexeril 10mg  PO QHS PRN for years, prescribed by my PCP for my Fibromyalgia, generally again on work days. Since moving to [facility] for some reason, I guess that hasn't been prescribed by you? The ortho has been prescribing it since November. I guess my question is, will you assume responsibility for RXing these meds for the long term? If not, do I have to continue to get these RXd by the ortho, even though primarily for my Fibromyalgia? Please let me know your thoughts. I don't know if the Fibromyalgia is progressing or just taking a greater toll but my pain is worse  :(   She notes that she has chronic pain mostly from [facility] at this time She was on prn flexeril in the past while in [place], as well as prn percocet.  She  does not feel that she needs Percocet any longer, but does get benefit from Flexeril.  She had both her knees replaced at once last November.  She was taken off flexeril and put on robaxin at that time per orthopedics.  She did ok until she went back to work-then she started having more pain, especially on days that she works long shifts.  She is now about done with orthopedics care- she is 6 months out from her surgery and they are not really doing anything active for her.  This is why she wondered if I might take over her pain management. Her knees are much better, but she continues to have some pain in the rest of her body which she attributes to fibromyalgia. She would like to go back on flexeril for pain at bedtime   She actually got an rx for flexeril from ortho just today- however she would like me to write the rx for her in the future.  She takes 10 mg of flexeril at bedtime only.  Does not use during the day as it makes her too sleepy She is taking tramadol prn, might take if she has a long shift or doing a lot around the house.  She might use 20- 30 tabs a month  She is on alprazolam but does not fill every month   She is ok with not taking alprazolam on  days that she uses tramadol   She is trying to potentially get onto shorter shifts, as doing the 12 hours is pretty tough on her.     03/30/2019  2   03/30/2019  Alprazolam 0.25 MG Tablet  30.00 15 Je Cop   [id number]   Wes (0126)   0  1.00 LME  Comm Ins   [place]  02/14/2019  2   02/14/2019  Tramadol Hcl 50 MG Tablet  60.00 30 Er Phi   [id number]   Wes (0126)   0  10.00 MME  Comm Ins   [place]  01/30/2019  2   01/29/2019  Alprazolam 0.25 MG Tablet  30.00 15 Je Cop   [id number]   [place] (0906)   0  1.00 LME  Comm Ins   [place]  01/04/2019  2   01/03/2019  Alprazolam 0.25 MG Tablet  20.00 10 Je Cop   [id number]   Mos (0906)   0  1.00 LME  Comm Ins   [place]  10/10/2018  2   10/10/2018  Tramadol Hcl 50 MG Tablet  60.00 15 Fr Row   [id number]   Mos (0906)   0  20.00 MME  Comm Ins    [place]  10/03/2018  2   10/03/2018  Tramadol Hcl 50 MG Tablet  30.00 8 Er Phi   [id number]   Mos (0906)   0  18.75 MME  Comm Ins   [place]  09/28/2018  2   09/27/2018  Oxycodone-Acetaminophen 5-325  30.00 5 Er Phi   [id number]   Mos (0906)   0  45.00 MME  Comm Ins   [place]  04/20/2018  2   04/20/2018  Oxycodone-Acetaminophen 5-325  10.00 10 Je Cop   [id number]   Med (5269)   0  7.50 MME  Comm Ins   [place]  03/31/2018  1   03/31/2018  Tramadol Hcl 50 MG Tablet  20.00 5 Ar Ram   [id number]   Mos (0906)   0  20.00 MME  Comm Ins     09/13/2017  1   09/01/2017  Oxycodone-Acetaminophen 10-325  15.00 5 Ch Cra   [id number]   [place] (4491)   0  45.00 MME      Pap smear: she is s/p hyst.  Done for benign disease.  Does not need pap Mammogram:  She is due, will order for her Tetanus shot: Per employee health Patient Active Problem List   Diagnosis Date Noted  . S/P TKR (total knee replacement), bilateral 09/27/2018  . Bilateral primary osteoarthritis of knee 09/26/2018  . Obesity (BMI 35.0-39.9 without comorbidity) 04/06/2018  . High triglycerides 04/06/2018  . Osteoarthritis of left knee 12/21/2017  . Chest pain 07/09/2017  . Fibromyalgia 07/09/2017  . Hypertension 07/09/2017  . Abnormal weight gain 01/31/2014  . Empty sella syndrome ([place]) 01/31/2014  . Other abnormal glucose 01/31/2014  . Neuropathy, peripheral 12/25/2013    Past Medical History:  Diagnosis Date  . Anemia    hx of  . Arthritis   . 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 Chicken pox   . Depression    hx of in past situational  . Fibromyalgia   . GERD (gastroesophageal reflux disease)   . Headache    hx of migraines  . History of kidney stones    1996  . Hypertension     Past Surgical History:  Procedure Laterality Date  . ABDOMINAL HYSTERECTOMY    . CHOLECYSTECTOMY  03-31-18 [name]  . CHOLECYSTECTOMY N/A 03/31/2018   Procedure: LAPAROSCOPIC CHOLECYSTECTOMY;  Surgeon: Ralene Ok, MD;  Location: [facility];  Service: General;  Laterality: N/A;   . COLONOSCOPY    . endoplantar fasciotomy    . GALLBLADDER SURGERY  03/31/2018  . KNEE ARTHROSCOPY     left knee 2012  . LAPAROSCOPIC CHOLECYSTECTOMY    . REPLACEMENT TOTAL KNEE BILATERAL Bilateral 09/27/2018  . TONSILLECTOMY     1999  . TOTAL KNEE ARTHROPLASTY Bilateral 09/27/2018   Procedure: TOTAL KNEE BILATERAL;  Surgeon: Frederik Pear, MD;  Location: [facility];  Service: Orthopedics;  Laterality: Bilateral;  . UPPER GASTROINTESTINAL ENDOSCOPY      Social History   Tobacco Use  . Smoking status: Never Smoker  . Smokeless tobacco: Never Used  Substance Use Topics  . Alcohol use: Yes    Comment: occ  . Drug use: No    Family History  Problem Relation Age of Onset  . Heart disease Father   . Prostate cancer Father   . Diabetes Father   . Irritable bowel syndrome Father   . Kidney disease Father   . Mitral valve prolapse Mother   . Heart disease Mother   . Pancreatic cancer Maternal Aunt   . Liver cancer Paternal Uncle   . Colon cancer Paternal Uncle   . Esophageal cancer Neg Hx   . Stomach cancer Neg Hx   . Rectal cancer Neg Hx     Allergies  Allergen Reactions  . Aspartame Other (See Comments)    Headaches.  . Demerol [Meperidine] Nausea And Vomiting    Medication list has been reviewed and updated.  Current Outpatient Medications on File Prior to Visit  Medication Sig Dispense Refill  . albuterol (PROVENTIL HFA;VENTOLIN HFA) 108 (90 Base) MCG/ACT inhaler Inhale 2 puffs into the lungs every 6 (six) hours as needed for wheezing or shortness of breath. (Patient not taking: Reported on 02/20/2019) 1 Inhaler 0  . ALPRAZolam (XANAX) 0.25 MG tablet Take 1 tablet (0.25 mg total) by mouth 2 (two) times daily as needed for anxiety. 30 tablet 0  . amLODipine (NORVASC) 10 MG tablet Take 1 tablet (10 mg total) by mouth daily. 90 tablet 3  . aspirin EC 81 MG tablet Take 1 tablet (81 mg total) by mouth 2 (two) times daily. 60 tablet 0  . B Complex Vitamins (B COMPLEX 1 PO)  Take 1 tablet by mouth daily.     . Biotin 5000 MCG CAPS Take 5,000 mcg by mouth daily.    . Calcium Carb-Cholecalciferol (CALCIUM 600+D) 600-800 MG-UNIT TABS Take 1 tablet by mouth daily.    . cholecalciferol (VITAMIN D3) 25 MCG (1000 UT) tablet Take 1,000 Units by mouth daily.    . cyclobenzaprine (FLEXERIL) 10 MG tablet Take 10 mg by mouth at bedtime. PRN    . DULoxetine (CYMBALTA) 60 MG capsule Take 1 capsule (60 mg total) by mouth 2 (two) times daily. 180 capsule 3  . Flaxseed, Linseed, (FLAX SEED OIL PO) Take 1,400 mg by mouth daily.    . Garlic (GARLIQUE PO) Take 1 tablet by mouth daily.    . hydrochlorothiazide (MICROZIDE) 12.5 MG capsule Take 1 capsule (12.5 mg total) by mouth daily. 90 capsule 3  . Icosapent Ethyl (VASCEPA) 1 g CAPS Take 2 capsules (2 g total) by mouth 2 (two) times daily. 120 capsule 3  . meloxicam (MOBIC) 15 MG tablet Take 15 mg by mouth daily.    . metoprolol tartrate (LOPRESSOR)  100 MG tablet Take 1 tablet (100 mg total) by mouth once for 1 dose. DAY OF THE CT (Patient not taking: Reported on 02/20/2019) 1 tablet 0  . nitrofurantoin, macrocrystal-monohydrate, (MACROBID) 100 MG capsule Take 1 capsule daily as needed following intercourse to prevent UTI (Patient taking differently: Take 100 mg by mouth See admin instructions. Take 100 mg  daily as needed following intercourse to prevent UTI) 30 capsule 2  . Omega-3 1000 MG CAPS Take 1,000 mg by mouth daily.    [place] Kitchen omeprazole (PRILOSEC) 20 MG capsule Take 1 capsule (20 mg total) by mouth daily. 90 capsule 3  . rosuvastatin (CRESTOR) 20 MG tablet Take 1 tablet (20 mg total) by mouth daily. 30 tablet 3  . TURMERIC PO Take 1 capsule by mouth daily.    . vitamin C (ASCORBIC ACID) 500 MG tablet Take 500 mg by mouth daily.     No current facility-administered medications on file prior to visit.     Review of Systems:  As per HPI- otherwise negative. Feeling well, no fever or chills No cough   Physical  Examination: There were no vitals filed for this visit. There were no vitals filed for this visit. There is no height or weight on file to calculate BMI. Ideal Body Weight:    Pt observed over video,  She looks well   Assessment and Plan: Screening for breast cancer - Plan: MM 3D SCREEN BREAST BILATERAL  Chronic pain syndrome - Plan: traMADol (ULTRAM) 50 MG tablet  Virtual visit today to discuss chronic pain management. She has history of fibromyalgia, has used muscle relaxers and pain medications over the years. She would like to use Flexeril 10 mg at bedtime, and tramadol up to 30 tablets/month.  I think this is a reasonable regimen.  I did advise her that on days that she uses tramadol, she should avoid taking benzodiazepines due to risk of interaction and accidental overdose  We have completed a controlled substance contract today, I will mail this to her so she can sign it. She will let me know when a refill is needed on Flexeril Ordered mammogram  Signed Lamar Blinks, MD

## 2019-04-12 ENCOUNTER — Other Ambulatory Visit: Payer: Self-pay

## 2019-04-12 ENCOUNTER — Ambulatory Visit (INDEPENDENT_AMBULATORY_CARE_PROVIDER_SITE_OTHER): Payer: 59 | Admitting: Family Medicine

## 2019-04-12 ENCOUNTER — Encounter: Payer: Self-pay | Admitting: Family Medicine

## 2019-04-12 DIAGNOSIS — G894 Chronic pain syndrome: Secondary | ICD-10-CM

## 2019-04-12 DIAGNOSIS — Z1239 Encounter for other screening for malignant neoplasm of breast: Secondary | ICD-10-CM

## 2019-04-12 MED ORDER — TRAMADOL HCL 50 MG PO TABS: 50.0000 mg | ORAL_TABLET | Freq: Two times a day (BID) | ORAL | 2 refills | Status: AC | PRN

## 2019-04-12 MED FILL — traMADol HCL 50 MG TABS: 50 | 15 days supply | Qty: 30 | Fill #0

## 2019-04-30 ENCOUNTER — Other Ambulatory Visit: Payer: Self-pay | Admitting: Family Medicine

## 2019-04-30 ENCOUNTER — Other Ambulatory Visit: Payer: Self-pay | Admitting: Cardiovascular Disease

## 2019-04-30 DIAGNOSIS — M797 Fibromyalgia: Secondary | ICD-10-CM

## 2019-04-30 MED FILL — traMADol HCL 50 MG TABS: 50 | 15 days supply | Qty: 30 | Fill #1

## 2019-04-30 MED FILL — ROSUVASTATIN CALCIUM 20 MG: 20 | 90 days supply | Qty: 90 | Fill #0

## 2019-05-01 MED FILL — HYDROCHLOROTHIAZIDE 12.5 MG: 12.5 | 90 days supply | Qty: 90 | Fill #0

## 2019-05-01 MED FILL — AMLODIPINE BESYLATE 10 MG T: 10 | 90 days supply | Qty: 90 | Fill #0

## 2019-05-01 MED FILL — MELOXICAM 15 MG TABLET: 15 | 90 days supply | Qty: 90 | Fill #0

## 2019-05-01 MED FILL — OMEPRAZOLE 20 MG CPDR: 20 | 90 days supply | Qty: 90 | Fill #0

## 2019-05-01 MED FILL — DULOXETINE HCL 60 MG CPEP: 60 | 90 days supply | Qty: 180 | Fill #0

## 2019-05-02 ENCOUNTER — Other Ambulatory Visit: Payer: Self-pay | Admitting: Family Medicine

## 2019-05-02 ENCOUNTER — Encounter: Payer: Self-pay | Admitting: Family Medicine

## 2019-05-02 DIAGNOSIS — N644 Mastodynia: Secondary | ICD-10-CM

## 2019-05-02 DIAGNOSIS — Z1239 Encounter for other screening for malignant neoplasm of breast: Secondary | ICD-10-CM

## 2019-05-06 ENCOUNTER — Encounter: Payer: Self-pay | Admitting: Family Medicine

## 2019-05-07 ENCOUNTER — Other Ambulatory Visit: Payer: Self-pay | Admitting: Cardiovascular Disease

## 2019-05-07 MED FILL — VASCEPA 1 GM CAPSULE: 1 | 30 days supply | Qty: 120 | Fill #0

## 2019-05-17 ENCOUNTER — Telehealth: Payer: Self-pay | Admitting: Cardiovascular Disease

## 2019-05-17 NOTE — Telephone Encounter (Signed)
New Message    Patient had an order for echo but appt was canceled due to Covid-19 and now the order is canceled as well.  Please submit a new order for patient to have Echo scheduled.

## 2019-05-17 NOTE — Telephone Encounter (Signed)
Can we please get patient rescheduled for ECHO- it says the echo is still in, just not scheduled yet- let me know if I need to replace the order. Thank you!

## 2019-05-17 NOTE — Telephone Encounter (Signed)
Left message for patient to call and reschedule Echocardiogram ordered by Dr. Claiborne Billings

## 2019-05-18 ENCOUNTER — Other Ambulatory Visit: Payer: Self-pay | Admitting: Family Medicine

## 2019-05-18 DIAGNOSIS — N644 Mastodynia: Secondary | ICD-10-CM

## 2019-05-18 NOTE — Telephone Encounter (Signed)
Left message for patient to call and reschedule Echo

## 2019-05-21 ENCOUNTER — Ambulatory Visit (INDEPENDENT_AMBULATORY_CARE_PROVIDER_SITE_OTHER): Payer: 59 | Admitting: Cardiovascular Disease

## 2019-05-21 ENCOUNTER — Other Ambulatory Visit: Payer: Self-pay

## 2019-05-21 VITALS — BP 120/80 | HR 71 | Temp 97.2°F | Ht 66.0 in | Wt 232.0 lb

## 2019-05-21 DIAGNOSIS — R0789 Other chest pain: Secondary | ICD-10-CM

## 2019-05-21 DIAGNOSIS — H0266 Xanthelasma of left eye, unspecified eyelid: Secondary | ICD-10-CM | POA: Diagnosis not present

## 2019-05-21 DIAGNOSIS — Z8249 Family history of ischemic heart disease and other diseases of the circulatory system: Secondary | ICD-10-CM | POA: Diagnosis not present

## 2019-05-21 DIAGNOSIS — E782 Mixed hyperlipidemia: Secondary | ICD-10-CM

## 2019-05-21 DIAGNOSIS — I10 Essential (primary) hypertension: Secondary | ICD-10-CM | POA: Diagnosis not present

## 2019-05-21 DIAGNOSIS — E669 Obesity, unspecified: Secondary | ICD-10-CM

## 2019-05-21 DIAGNOSIS — H0263 Xanthelasma of right eye, unspecified eyelid: Secondary | ICD-10-CM | POA: Diagnosis not present

## 2019-05-21 DIAGNOSIS — Z79899 Other long term (current) drug therapy: Secondary | ICD-10-CM | POA: Diagnosis not present

## 2019-05-21 DIAGNOSIS — M797 Fibromyalgia: Secondary | ICD-10-CM

## 2019-05-21 DIAGNOSIS — K219 Gastro-esophageal reflux disease without esophagitis: Secondary | ICD-10-CM

## 2019-05-21 NOTE — Progress Notes (Signed)
Cardiology Office Note    Date:  05/23/2019   ID:  Dawn Reynolds, DOB [birth year], MRN [id number]  PCP:  Dawn Mclean, MD  Cardiologist:  Dawn Majestic, MD   F/U Cardiology evaluation  History of Present Illness:  Dawn Reynolds is a 53 y.o. female who I initially saw on January 02, 2023 presents for new cardiology evaluation with a chief complaint of left-sided chest pain the past several weeks.  She now presents for a 1-monthfollow-up evaluation.  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  Several years ago her triglycerides were over 600 and a year ago are over 400.  She was never started on medication but on her own instituted over-the-counter omega-3 fatty acid in addition to garlic and flaxseed oil.  Over the past 2 weeks, she has begun to notice left-sided chest pain radiating to her arm.  She would notice a fairly constant discomfort along the left side of her chest.  At times when she would rest around she would notice that perhaps she had slightly more discomfort.  She does have a longstanding history of fibromyalgia.  In November 2020 she underwent bilateral knee replacement surgery by Dr. FFrederik Pear  She admits to at least a 12 pound weight gain with some immobility since her surgery but apparently as of November 22, 2018 she returned to work and has been working full-time on her feet most of the day.  Due to several days of chest pain development [facility] atypical chest pain.  She had experienced constant chest pain at a low level but became more severe with activity.  She also had episodes of increased perspiration.  In the emergency room, troponins were negative.  D-dimer was mildly positive at 2.56.  She had a normal lipase, normal LFTs, and she was not anemic with a hemoglobin of  12.8 and hematocrit of 42.1.  Chest x-ray revealed minimal scarring at the left base without edema or consolidation.  Because of her elevated d-dimer a CT scan was performed of her chest which was negative for PE.  She was noted to have bibasilar scarring.  She saw her primary physician, Dr. JBeckie Saltsin follow-up of her ER evaluation.  When she saw Dr. CMargaretmary Bayleythe patient requested lipid studies be obtained.  This showed slight improvement from her total cholesterol of 9 months previously at 246 which was now at 209.  Her triglycerides had improved from 403 but were still significantly elevated at 256.  VLDL was elevated at 51.2 and her HDL had risen to 51.1.  Direct LDL was 119.    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  Rosuvastatin 20 mg was initiated and I also discussed with her the "reduce it "trial data with Vascepa which was shown to have positive cardiovascular output benefit.  Since that time, she has been on therapy.  She admits to significant lifestyle changes and over these past 4 months has lost 20 pounds.  I recommended she undergo a 2D echo Doppler study and a CT coronary angios light of her chest pain to further assess for potential coronary atherosclerosis.  However, with the  COVID-19 pandemic the studies were canceled and therefore not yet performed.  Presently, she feels well.  She denies recent recurrent chest tightness.  She does have issues with fibromyalgia.  She denies palpitations.  She denies PND orthopnea.  She presents for reevaluation.  Past Medical History:  Diagnosis Date  . Anemia    hx of  . Arthritis   . 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 Chicken pox   . Depression    hx of in past situational  . Fibromyalgia   . GERD (gastroesophageal reflux disease)   . Headache    hx of  migraines  . History of kidney stones    1996  . Hypertension     Past Surgical History:  Procedure Laterality Date  . ABDOMINAL HYSTERECTOMY    . CHOLECYSTECTOMY     03-31-18 Dawn Reynolds  . CHOLECYSTECTOMY N/A 03/31/2018   Procedure: LAPAROSCOPIC CHOLECYSTECTOMY;  Surgeon: Ralene Ok, MD;  Location: [facility] ORS;  Service: General;  Laterality: N/A;  . COLONOSCOPY    . endoplantar fasciotomy    . GALLBLADDER SURGERY  03/31/2018  . KNEE ARTHROSCOPY     left knee 2012  . LAPAROSCOPIC CHOLECYSTECTOMY    . REPLACEMENT TOTAL KNEE BILATERAL Bilateral 09/27/2018  . TONSILLECTOMY     1999  . TOTAL KNEE ARTHROPLASTY Bilateral 09/27/2018   Procedure: TOTAL KNEE BILATERAL;  Surgeon: Frederik Pear, MD;  Location: [facility];  Service: Orthopedics;  Laterality: Bilateral;  . UPPER GASTROINTESTINAL ENDOSCOPY      Current Medications: Outpatient Medications Prior to Visit  Medication Sig Dispense Refill  . albuterol (PROVENTIL HFA;VENTOLIN HFA) 108 (90 Base) MCG/ACT inhaler Inhale 2 puffs into the lungs every 6 (six) hours as needed for wheezing or shortness of breath. 1 Inhaler 0  . ALPRAZolam (XANAX) 0.25 MG tablet Take 1 tablet (0.25 mg total) by mouth 2 (two) times daily as needed for anxiety. 30 tablet 0  . amLODipine (NORVASC) 10 MG tablet TAKE 1 TABLET (10 MG TOTAL) BY MOUTH DAILY. 90 tablet 3  . aspirin EC 81 MG tablet Take 1 tablet (81 mg total) by mouth 2 (two) times daily. 60 tablet 0  . B Complex Vitamins (B COMPLEX 1 PO) Take 1 tablet by mouth daily.     . Biotin 5000 MCG CAPS Take 5,000 mcg by mouth daily.    . Calcium Carb-Cholecalciferol (CALCIUM 600+D) 600-800 MG-UNIT TABS Take 1 tablet by mouth daily.    . cholecalciferol (VITAMIN D3) 25 MCG (1000 UT) tablet Take 1,000 Units by mouth daily.    . cyclobenzaprine (FLEXERIL) 10 MG tablet Take 10 mg by mouth at bedtime. PRN    . DULoxetine (CYMBALTA) 60 MG capsule TAKE 1 CAPSULE BY MOUTH 2 TIMES DAILY. 180 capsule 3  . Flaxseed, Linseed,  (FLAX SEED OIL PO) Take 1,400 mg by mouth daily.    . Garlic (GARLIQUE PO) Take 1 tablet by mouth daily.    . hydrochlorothiazide (MICROZIDE) 12.5 MG capsule TAKE 1 CAPSULE (12.5 MG TOTAL) BY MOUTH DAILY. 90 capsule 3  . meloxicam (MOBIC) 15 MG tablet TAKE 1 TABLET (15 MG TOTAL) BY MOUTH DAILY. 90 tablet 3  . nitrofurantoin, macrocrystal-monohydrate, (MACROBID) 100 MG capsule Take 1 capsule daily as needed following intercourse to prevent UTI (Patient taking differently: Take 100 mg by mouth See admin instructions. Take 100 mg  daily as needed following intercourse to prevent UTI) 30 capsule 2  . Omega-3 1000 MG CAPS Take 1,000 mg by mouth daily.    Marland Kitchen  omeprazole (PRILOSEC) 20 MG capsule TAKE 1 CAPSULE (20 MG TOTAL) BY MOUTH DAILY. 90 capsule 3  . rosuvastatin (CRESTOR) 20 MG tablet TAKE 1 TABLET (20 MG TOTAL) BY MOUTH DAILY. 90 tablet 1  . TURMERIC PO Take 1 capsule by mouth daily.    Marland Kitchen VASCEPA 1 g CAPS TAKE 2 CAPSULES (2 G TOTAL) BY MOUTH 2 (TWO) TIMES DAILY. 120 capsule 0  . vitamin C (ASCORBIC ACID) 500 MG tablet Take 500 mg by mouth daily.    . metoprolol tartrate (LOPRESSOR) 100 MG tablet Take 1 tablet (100 mg total) by mouth once for 1 dose. DAY OF THE CT (Patient not taking: Reported on 02/20/2019) 1 tablet 0   No facility-administered medications prior to visit.      Allergies:   Aspartame and Demerol [meperidine]   Social History   Socioeconomic History  . Marital status: Married    Spouse name: Not on file  . Number of children: 2  . Years of education: Not on file  . Highest education level: Not on file  Occupational History  . Occupation: Optician, dispensing: [employer]    Comment: CICU nurse  Social Needs  . Financial resource strain: Not on file  . Food insecurity    Worry: Not on file    Inability: Not on file  . Transportation needs    Medical: Not on file    Non-medical: Not on file  Tobacco Use  . Smoking status: Never Smoker  . Smokeless tobacco: Never  Used  Substance and Sexual Activity  . Alcohol use: Yes    Comment: occ  . Drug use: No  . Sexual activity: Not on file  Lifestyle  . Physical activity    Days per week: Not on file    Minutes per session: Not on file  . Stress: Not on file  Relationships  . Social Herbalist on phone: Not on file    Gets together: Not on file    Attends religious service: Not on file    Active member of club or organization: Not on file    Attends meetings of clubs or organizations: Not on file    Relationship status: Not on file  Other Topics Concern  . Not on file  Social History Narrative  . Not on file     Socially she was born in [place].  She had attended [employer] as well as [employer].  She initially had a degree in recreation therapy.  She has lived in [place], and ultimately worked in [place], [place], [place] in [place] before moving back to [place].  She is divorced x2 and has been married for 13 years.  She has 2 children and 6 step grandchildren.  She is a Equities trader.  There is no tobacco history.  She rarely drinks a glass of wine.  She does not routinely exercise.  Family History:  The patient's  family history includes Colon cancer in her paternal uncle; Diabetes in her father; Heart disease in her father and mother; Irritable bowel syndrome in her father; Kidney disease in her father; Liver cancer in her paternal uncle; Mitral valve prolapse in her mother; Pancreatic cancer in her maternal aunt; Prostate cancer in her father.   Additional Family history is notable that her mother had rheumatic fever and underwent mitral valve replacement and also has a history of atrial fibrillation.  She is 72.  Her father has undergone 2  previous CABG revascularization surgeries one in his 78s with the second in his 52s.  He died at age 32 and had a history of prostate CA.  She has 2 brothers one was recently diagnosed with hypertension and another has thyroid  cancer and melanoma.  ROS General: Negative; No fevers, chills, or night sweats; obesity HEENT: Negative; No changes in vision or hearing, sinus congestion, difficulty swallowing Pulmonary: Negative; No cough, wheezing, shortness of breath, hemoptysis Cardiovascular: Negative; No chest pain, presyncope, syncope, palpitations GI: GERD GU: Negative; No dysuria, hematuria, or difficulty voiding Musculoskeletal: Bilateral knee replacement Hematologic/Oncology: Negative; no easy bruising, bleeding Rheumatologic: History of fibromyalgia Endocrine: Negative; no heat/cold intolerance; no diabetes Neuro: Negative; no changes in balance, headaches Skin: Negative; No rashes or skin lesions Psychiatric: Negative; No behavioral problems, depression Sleep: Negative; No snoring, daytime sleepiness, hypersomnolence, bruxism, restless legs, hypnogognic hallucinations, no cataplexy Other comprehensive 14 point system review is negative.   PHYSICAL EXAM:   VS:  BP 120/80   Pulse 71   Temp (!) 97.2 F (36.2 C)   Ht _0  (1.676 m)   Wt 232 lb (105.2 kg)   LMP 06/09/2015 (Approximate)   SpO2 96%   BMI 37.45 kg/m     Repeat blood pressure by me 114/72  Wt Readings from Last 3 Encounters:  05/21/19 232 lb (105.2 kg)  01/02/19 252 lb (114.3 kg)  12/28/18 254 lb (115.2 kg)    General: Alert, oriented, no distress.  Skin: normal turgor, no rashes, warm and dry HEENT: Normocephalic, atraumatic. Pupils equal round and reactive to light; sclera anicteric; extraocular muscles intact; bilateral xanthelasmas, seem improved from her initial evaluation Nose without nasal septal hypertrophy Mouth/Parynx benign; Mallinpatti scale Neck: No JVD, no carotid bruits; normal carotid upstroke Lungs: clear to ausculatation and percussion; no wheezing or rales Chest wall: Resolution of previous chest wall tenderness to palpation  Heart: PMI not displaced, RRR, s1 s2 normal, 1/6 systolic murmur, no diastolic  murmur, no rubs, gallops, thrills, or heaves Abdomen: soft, nontender; no hepatosplenomehaly, BS+; abdominal aorta nontender and not dilated by palpation. Back: no CVA tenderness Pulses 2+ Musculoskeletal: full range of motion, normal strength, no joint deformities Extremities: no clubbing cyanosis or edema, Homan's sign negative  Neurologic: grossly nonfocal; Cranial nerves grossly wnl Psychologic: Normal mood and affect    Studies/Labs Reviewed:   EKG:  EKG is  ordered today.  ECG (independently read by me): NSR at 71; T wave abnormality V1-2.  January 02, 2019 ECG (independently read by me): Normal sinus rhythm at 73 bpm.  Nonspecific T wave abnormality in V1-2.  Normal intervals.  No ectopy.  Recent Labs: BMP Latest Ref Rng & Units 05/21/2019 12/21/2018 09/28/2018  Glucose 65 - 99 mg/dL 95 99 183(H)  BUN 6 - 24 mg/dL 22 23(H) 20  Creatinine 0.57 - 1.00 mg/dL 0.89 0.85 0.70  BUN/Creat Ratio 9 - 23 25(H) - -  Sodium 134 - 144 mmol/L 141 139 139  Potassium 3.5 - 5.2 mmol/L 4.0 3.9 4.2  Chloride 96 - 106 mmol/L 101 107 105  CO2 20 - 29 mmol/L _1 Calcium 8.7 - 10.2 mg/dL 9.7 9.2 8.5(L)     Hepatic Function Latest Ref Rng & Units 05/21/2019 12/21/2018 04/06/2018  Total Protein 6.0 - 8.5 g/dL 6.9 6.6 7.3  Albumin 3.8 - 4.9 g/dL 4.7 3.7 4.3  AST 0 - 40 IU/L _2 ALT 0 - 32 IU/L _3 Alk Phosphatase 39 -  117 IU/L 62 64 73  Total Bilirubin 0.0 - 1.2 mg/dL 1.2 0.8 0.9  Bilirubin, Direct 0.0 - 0.2 mg/dL - 0.1 -    CBC Latest Ref Rng & Units 12/21/2018 09/28/2018 09/26/2018  WBC 4.0 - 10.5 K/uL 8.2 16.3(H) 6.5  Hemoglobin 12.0 - 15.0 g/dL 12.8 11.6(L) 14.6  Hematocrit 36.0 - 46.0 % 42.1 36.6 45.9  Platelets 150 - 400 K/uL 342 230 274   Lab Results  Component Value Date   MCV 84.2 12/21/2018   MCV 91.5 09/28/2018   MCV 90.0 09/26/2018   Lab Results  Component Value Date   TSH 4.04 04/06/2018   Lab Results  Component Value Date   HGBA1C 6.0 04/06/2018      BNP No results found for: BNP  ProBNP No results found for: PROBNP   Lipid Panel     Component Value Date/Time   CHOL 103 05/21/2019 1116   TRIG 104 05/21/2019 1116   HDL 49 05/21/2019 1116   CHOLHDL 2.1 05/21/2019 1116   CHOLHDL 4 12/28/2018 1302   VLDL 51.2 (H) 12/28/2018 1302   LDLCALC 33 05/21/2019 1116   LDLDIRECT 119.0 12/28/2018 1302     RADIOLOGY: US Breast Ltd Uni Left Inc Axilla  Result Date: 05/22/2019 CLINICAL DATA:  53 year old female presenting for evaluation of pain in the lower outer quadrant of the left breast. EXAM: DIGITAL DIAGNOSTIC BILATERAL MAMMOGRAM WITH CAD AND TOMO LEFT BREAST ULTRASOUND COMPARISON:  Previous exam(s). ACR Breast Density Category b: There are scattered areas of fibroglandular density. FINDINGS: No suspicious calcifications, masses or areas of distortion are seen in the bilateral breasts. Mammographic images were processed with CAD. Ultrasound of the lower outer quadrant of the left breast demonstrates normal fibroglandular tissue. No masses or suspicious areas of shadowing are identified. IMPRESSION: 1. There are no mammographic or targeted sonographic abnormalities in the lateral to lower outer left breast to explain the patient's pain. 2.  No mammographic evidence of malignancy in the bilateral breasts. RECOMMENDATION: 1.  Screening mammogram in one year.(Code:SM-B-01Y) 2. Clinical follow-up recommended for the painful area of concern in the lower outer left breast. Any further workup should be based on clinical grounds. I have discussed the findings and recommendations with the patient. Results were also provided in writing at the conclusion of the visit. If applicable, a reminder letter will be sent to the patient regarding the next appointment. BI-RADS CATEGORY  2: Benign. Electronically Signed   By: Ammie Ferrier M.D.   On: 05/22/2019 10:21   Mm Diag Breast Tomo Bilateral  Result Date: 05/22/2019 CLINICAL DATA:  53 year old female  presenting for evaluation of pain in the lower outer quadrant of the left breast. EXAM: DIGITAL DIAGNOSTIC BILATERAL MAMMOGRAM WITH CAD AND TOMO LEFT BREAST ULTRASOUND COMPARISON:  Previous exam(s). ACR Breast Density Category b: There are scattered areas of fibroglandular density. FINDINGS: No suspicious calcifications, masses or areas of distortion are seen in the bilateral breasts. Mammographic images were processed with CAD. Ultrasound of the lower outer quadrant of the left breast demonstrates normal fibroglandular tissue. No masses or suspicious areas of shadowing are identified. IMPRESSION: 1. There are no mammographic or targeted sonographic abnormalities in the lateral to lower outer left breast to explain the patient's pain. 2.  No mammographic evidence of malignancy in the bilateral breasts. RECOMMENDATION: 1.  Screening mammogram in one year.(Code:SM-B-01Y) 2. Clinical follow-up recommended for the painful area of concern in the lower outer left breast. Any further workup should be based on clinical grounds.  I have discussed the findings and recommendations with the patient. Results were also provided in writing at the conclusion of the visit. If applicable, a reminder letter will be sent to the patient regarding the next appointment. BI-RADS CATEGORY  2: Benign. Electronically Signed   By: Ammie Ferrier M.D.   On: 05/22/2019 10:21     Additional studies/ records that were reviewed today include:  I reviewed the patient's recent hospital records, as well as her office evaluation with Dr. Janett Billow Copland  ASSESSMENT:    1. Obesity (BMI 35.0-39.9 without comorbidity)   2. Mixed hyperlipidemia   3. Essential hypertension   4. Atypical chest pain   5. Xanthelasma of eyelid, bilateral   6. Fibromyalgia   7. Family history of heart disease   8. Gastroesophageal reflux disease without esophagitis      PLAN:  Ms. 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  At the time I felt her chest pain was atypical for coronary obstructive disease and on initial evaluation she had definite chest wall tenderness suggesting a musculoskeletal etiology.  I commended her on her significant adjustment with diet since my initial evaluation.  She tells me her peak weight in 2017 was 290 pounds.  When I saw her in February 2020 she weighed 252 pounds and today weighs 232 pounds.  She has significantly improved her diet.  She is fasting today and as result fasting laboratory was obtained.  These results became available prior to dictating this note demonstrate marked benefit with total cholesterol 103, triglycerides 104, HDL 49, LDL 33.  I commended her on her marked improvement.  She will continue to take rosuvastatin and Vascepa as prescribed.  She will be rescheduled to undergo the echo Doppler study and CT scan.  We discussed continued exercise and weight loss to a target of less than 200 pounds at all possible.  Her blood pressure today is stable on amlodipine 10 mg in addition to HCTZ 12.5 mg.  She continues to take omeprazole for GERD.  I will see her in 3 months for follow-up evaluation.   Medication Adjustments/Labs and Tests Ordered: Current medicines are reviewed at length with the patient today.  Concerns regarding medicines are outlined above.  Medication changes, Labs and Tests ordered today are listed in the Patient Instructions below. Patient Instructions  Medication Instructions:  The current medical regimen is effective;  continue present plan and medications.  If you need a refill on your cardiac medications before your next appointment, please call your pharmacy.   Lab work: CMET, LIPID today If you have labs  (blood work) drawn today and your tests are completely normal, you will receive your results only by: Marland Kitchen MyChart Message (if you have MyChart) OR . A paper copy in the mail If you have any lab test that is abnormal or we need to change your treatment, we will call you to review the results.  Testing/Procedures: Get CT scheduled from previous.  Get ECHO scheduled from previous.  Follow-Up: At [facility], you and your health needs are our priority.  As part of our continuing mission to provide you with exceptional heart care, we have created designated Provider Care Teams.  These Care Teams include your primary Cardiologist (physician) and Advanced Practice Providers (  APPs -  Physician Assistants and Nurse Practitioners) who all work together to provide you with the care you need, when you need it. You will need a follow up appointment in 3 months.  Please call our office 2 months in advance to schedule this appointment.  You may see Dawn Majestic, MD or one of the following Advanced Practice Providers on your designated Care Team: [place], [place] . Fabian Sharp, PA-C        Signed, Dawn Majestic, MD  05/23/2019 6:31 PM    [facility] [address], [place], [place], [place]  [address] Phone: [phone]

## 2019-05-21 NOTE — Patient Instructions (Signed)
Medication Instructions:  The current medical regimen is effective;  continue present plan and medications.  If you need a refill on your cardiac medications before your next appointment, please call your pharmacy.   Lab work: CMET, LIPID today If you have labs (blood work) drawn today and your tests are completely normal, you will receive your results only by: Marland Kitchen MyChart Message (if you have MyChart) OR . A paper copy in the mail If you have any lab test that is abnormal or we need to change your treatment, we will call you to review the results.  Testing/Procedures: Get CT scheduled from previous.  Get ECHO scheduled from previous.  Follow-Up: At [facility], you and your health needs are our priority.  As part of our continuing mission to provide you with exceptional heart care, we have created designated Provider Care Teams.  These Care Teams include your primary Cardiologist (physician) and Advanced Practice Providers (APPs -  Physician Assistants and Nurse Practitioners) who all work together to provide you with the care you need, when you need it. You will need a follow up appointment in 3 months.  Please call our office 2 months in advance to schedule this appointment.  You may see Shelva Majestic, MD or one of the following Advanced Practice Providers on your designated Care Team: [place], [place] . Fabian Sharp, PA-C

## 2019-05-22 ENCOUNTER — Other Ambulatory Visit: Payer: Self-pay | Admitting: Family Medicine

## 2019-05-22 ENCOUNTER — Ambulatory Visit
Admission: RE | Admit: 2019-05-22 | Discharge: 2019-05-22 | Disposition: A | Discharge status: Home / Self Care | Payer: 59 | Source: Ambulatory Visit | Attending: Family Medicine | Admitting: Family Medicine

## 2019-05-22 DIAGNOSIS — N644 Mastodynia: Secondary | ICD-10-CM

## 2019-05-22 DIAGNOSIS — R928 Other abnormal and inconclusive findings on diagnostic imaging of breast: Secondary | ICD-10-CM | POA: Diagnosis not present

## 2019-05-22 DIAGNOSIS — N6489 Other specified disorders of breast: Secondary | ICD-10-CM | POA: Diagnosis not present

## 2019-05-22 LAB — LIPID PANEL
Chol/HDL Ratio: 2.1 ratio (ref 0.0–4.4)
Cholesterol, Total: 103 mg/dL (ref 100–199)
HDL: 49 mg/dL (ref >39)
LDL Calculated: 33 mg/dL (ref 0–99)
Triglycerides: 104 mg/dL (ref 0–149)
VLDL Cholesterol Cal: 21 mg/dL (ref 5–40)

## 2019-05-22 LAB — COMPREHENSIVE METABOLIC PANEL
ALT: 19 IU/L (ref 0–32)
AST: 24 IU/L (ref 0–40)
Albumin/Globulin Ratio: 2.1 (ref 1.2–2.2)
Albumin: 4.7 g/dL (ref 3.8–4.9)
Alkaline Phosphatase: 62 IU/L (ref 39–117)
BUN/Creatinine Ratio: 25 — ABNORMAL HIGH (ref 9–23)
BUN: 22 mg/dL (ref 6–24)
Bilirubin Total: 1.2 mg/dL (ref 0.0–1.2)
CO2: 24 mmol/L (ref 20–29)
Calcium: 9.7 mg/dL (ref 8.7–10.2)
Chloride: 101 mmol/L (ref 96–106)
Creatinine, Ser: 0.89 mg/dL (ref 0.57–1.00)
GFR calc Af Amer: 86 mL/min/{1.73_m2} (ref >59)
GFR calc non Af Amer: 75 mL/min/{1.73_m2} (ref >59)
Globulin, Total: 2.2 g/dL (ref 1.5–4.5)
Glucose: 95 mg/dL (ref 65–99)
Potassium: 4 mmol/L (ref 3.5–5.2)
Sodium: 141 mmol/L (ref 134–144)
Total Protein: 6.9 g/dL (ref 6.0–8.5)

## 2019-05-23 ENCOUNTER — Encounter: Payer: Self-pay | Admitting: Cardiovascular Disease

## 2019-05-29 ENCOUNTER — Other Ambulatory Visit: Payer: Self-pay

## 2019-05-29 DIAGNOSIS — R0789 Other chest pain: Secondary | ICD-10-CM

## 2019-05-29 DIAGNOSIS — I10 Essential (primary) hypertension: Secondary | ICD-10-CM

## 2019-06-04 ENCOUNTER — Other Ambulatory Visit: Payer: Self-pay

## 2019-06-04 ENCOUNTER — Ambulatory Visit (HOSPITAL_COMMUNITY): Payer: 59 | Attending: Cardiology

## 2019-06-04 ENCOUNTER — Encounter: Payer: Self-pay | Admitting: Family Medicine

## 2019-06-04 DIAGNOSIS — R079 Chest pain, unspecified: Secondary | ICD-10-CM | POA: Diagnosis not present

## 2019-06-04 MED FILL — CYCLOBENZAPRINE HCL 10 MG T: 10 | 30 days supply | Qty: 30 | Fill #2

## 2019-06-07 ENCOUNTER — Encounter: Payer: Self-pay | Admitting: Family Medicine

## 2019-06-11 ENCOUNTER — Other Ambulatory Visit: Payer: Self-pay | Admitting: Cardiovascular Disease

## 2019-06-11 MED FILL — VASCEPA 1 GM CAPSULE: 1 | 30 days supply | Qty: 120 | Fill #0

## 2019-06-20 ENCOUNTER — Encounter: Payer: Self-pay | Admitting: Family Medicine

## 2019-06-20 DIAGNOSIS — N390 Urinary tract infection, site not specified: Secondary | ICD-10-CM

## 2019-06-21 MED ORDER — NITROFURANTOIN MONOHYD MACRO 100 MG PO CAPS: ORAL_CAPSULE | ORAL | 0 refills | Status: DC

## 2019-07-03 ENCOUNTER — Telehealth (HOSPITAL_COMMUNITY): Payer: Self-pay | Admitting: Emergency Medicine

## 2019-07-03 NOTE — Telephone Encounter (Signed)
Left message on voicemail with name and callback number Kionte Baumgardner RN Navigator Cardiac Imaging [facility] [phone] Office [phone] Cell  

## 2019-07-04 ENCOUNTER — Ambulatory Visit (HOSPITAL_COMMUNITY)
Admission: RE | Admit: 2019-07-04 | Discharge: 2019-07-04 | Disposition: A | Discharge status: Home / Self Care | Payer: 59 | Source: Ambulatory Visit | Attending: Cardiovascular Disease | Admitting: Cardiovascular Disease

## 2019-07-04 ENCOUNTER — Other Ambulatory Visit: Payer: Self-pay

## 2019-07-04 ENCOUNTER — Encounter (HOSPITAL_COMMUNITY): Payer: Self-pay

## 2019-07-04 DIAGNOSIS — I10 Essential (primary) hypertension: Secondary | ICD-10-CM | POA: Diagnosis not present

## 2019-07-04 DIAGNOSIS — R0789 Other chest pain: Secondary | ICD-10-CM | POA: Diagnosis not present

## 2019-07-04 MED ORDER — SODIUM CHLORIDE 0.9 % IV BOLUS
250.0000 mL | Freq: Once | INTRAVENOUS | Status: AC
  Administered 2019-07-04: 10:00:00 250 mL via INTRAVENOUS

## 2019-07-04 MED ORDER — NITROGLYCERIN 0.4 MG SL SUBL
0.4000 mg | SUBLINGUAL_TABLET | Freq: Once | SUBLINGUAL | Status: AC
  Administered 2019-07-04: 10:00:00 0.4 mg via SUBLINGUAL
  Filled 2019-07-04: qty 25

## 2019-07-04 MED ORDER — IOHEXOL 350 MG/ML SOLN
80.0000 mL | Freq: Once | INTRAVENOUS | Status: AC | PRN
  Administered 2019-07-04: 80 mL via INTRAVENOUS

## 2019-07-04 MED ORDER — NITROGLYCERIN 0.4 MG SL SUBL
SUBLINGUAL_TABLET | SUBLINGUAL | Status: AC
  Administered 2019-07-04: 0.4 mg via SUBLINGUAL
  Filled 2019-07-04: qty 1

## 2019-07-04 MED ORDER — SODIUM CHLORIDE 0.9 % IV BOLUS
250.0000 mL | Freq: Once | INTRAVENOUS | Status: AC
  Administered 2019-07-04: 250 mL via INTRAVENOUS

## 2019-07-04 NOTE — Progress Notes (Signed)
   07/04/19 0842  Vital Signs  ECG Heart Rate (!) 43  BP 96/64   Patient states she feel dizzy sitting in chair. Dr. Aundra Dubin made aware of vital signs and symptoms. Order given for 250NS bolus. Patient updated on plan of care. Will continue to monitor.

## 2019-07-14 ENCOUNTER — Other Ambulatory Visit: Payer: Self-pay | Admitting: Cardiovascular Disease

## 2019-07-17 MED FILL — VASCEPA 1 GM CAPSULE: 1 | 30 days supply | Qty: 120 | Fill #0

## 2019-07-17 MED FILL — CYCLOBENZAPRINE HCL 10 MG T: 10 | 30 days supply | Qty: 30 | Fill #0

## 2019-08-02 IMAGING — MR MR LUMBAR SPINE W/O CM
4 of 5 series · 19 of 48 positions shown · non-contrast
Comparison: None.

CLINICAL DATA: Left buttock pain, right leg and knee pain since
07/06/2018

EXAM:
MRI LUMBAR SPINE WITHOUT CONTRAST
TECHNIQUE: Multiplanar, multisequence MR imaging of the lumbar spine was
performed. No intravenous contrast was administered.

[Series 3: T1 · sagittal · 4.0mm · 0.51mm/px · 3 of 12 slices shown (1 of 2)]
[im 3/12]
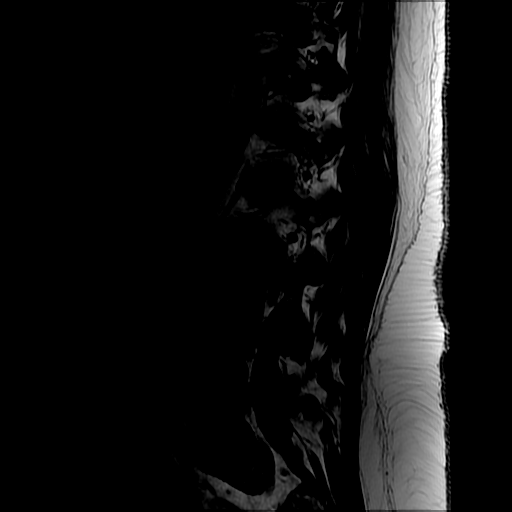
[im 7/12]
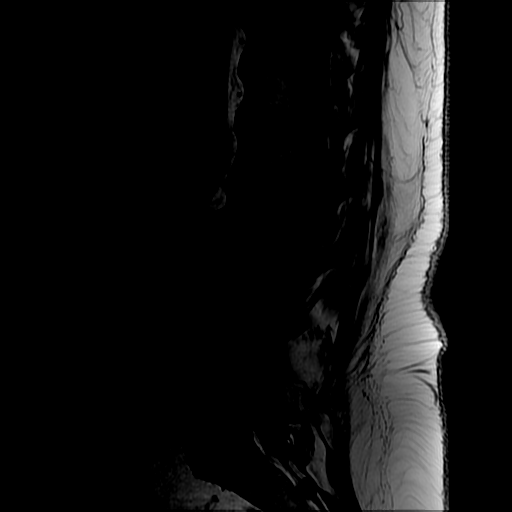
[im 12/12]
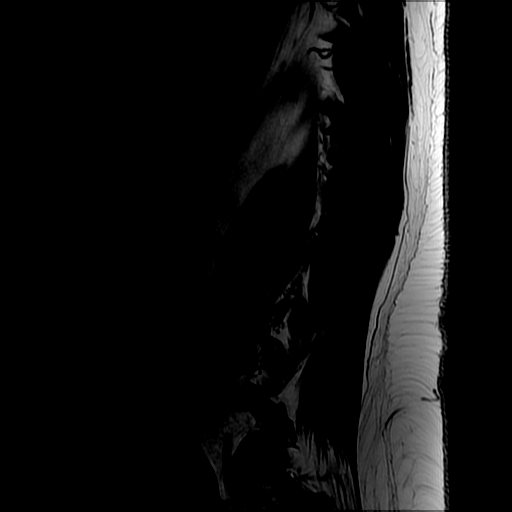

[Series 4: T2 post-contrast · sagittal · 4.0mm · 0.51mm/px · 5 of 12 slices shown]
[im 1/12]
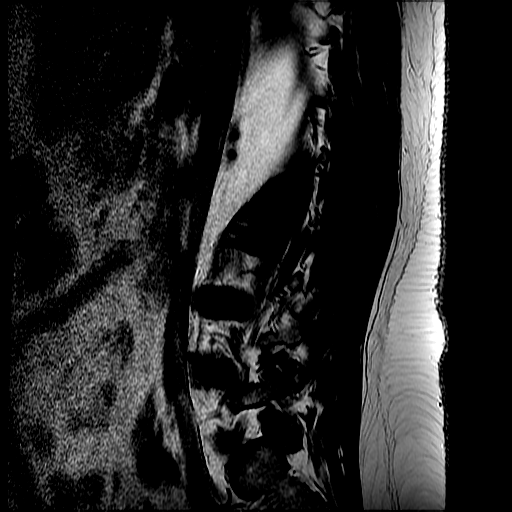
[im 3/12]
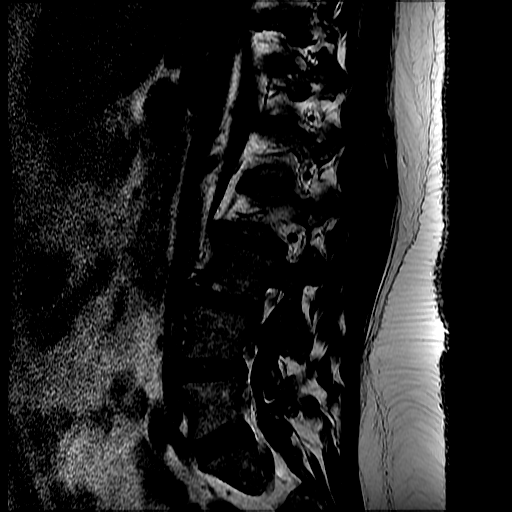
[im 6/12]
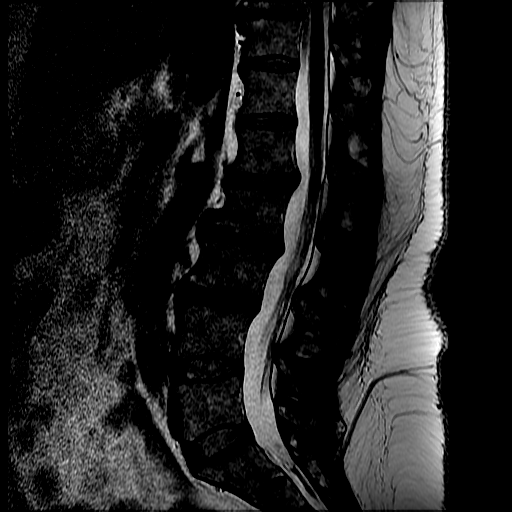
[im 9/12]
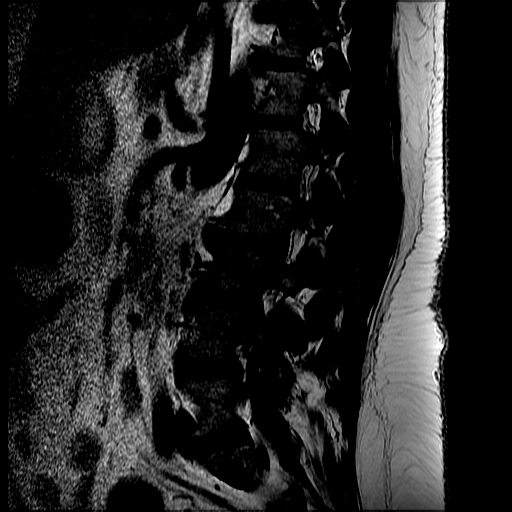
[im 12/12]
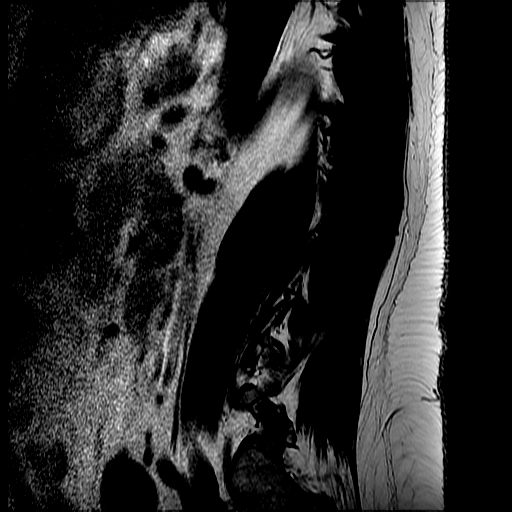

[Series 6: T2 · axial · 4.0mm · 0.39mm/px · z∈[-92,+76]mm · 8 of 38 slices shown]
[im 3/38]
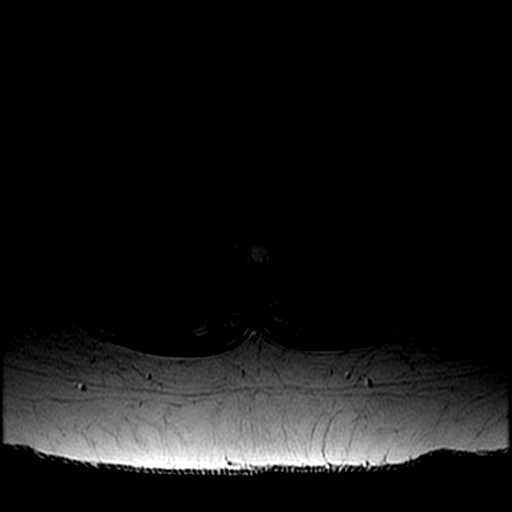
[im 5/38]
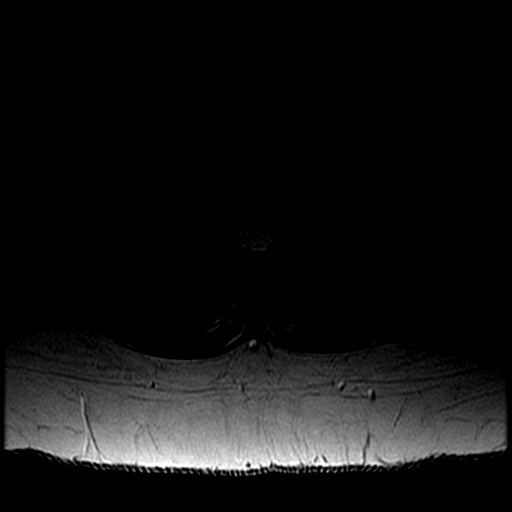
[im 8/38]
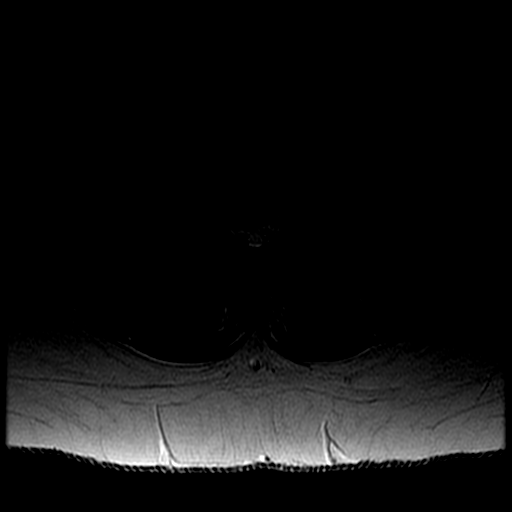
[im 13/38]
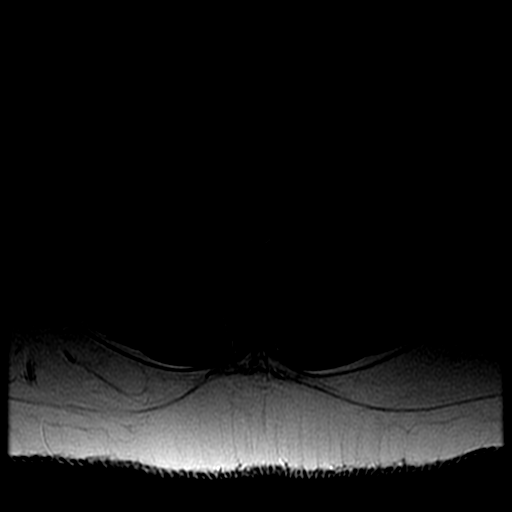
[im 18/38]
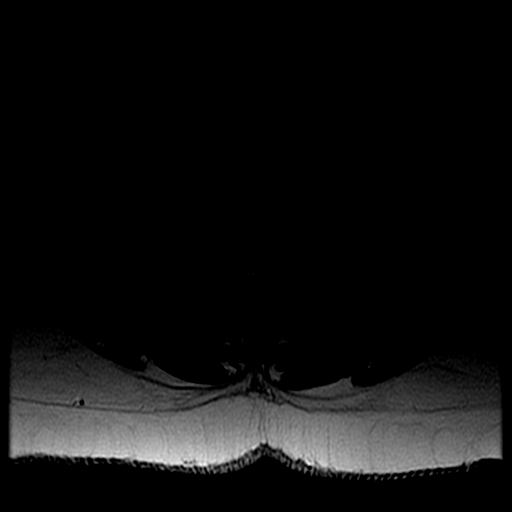
[im 20/38]
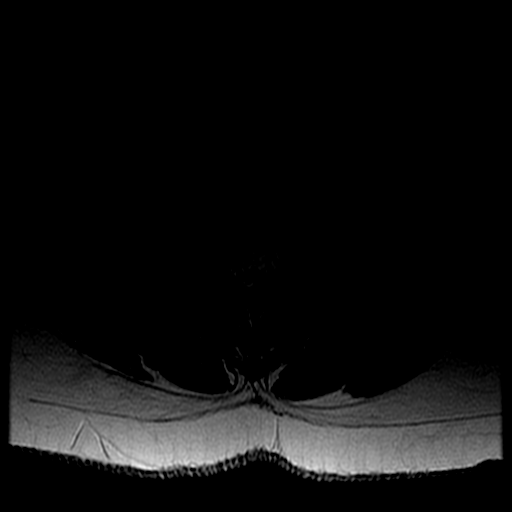
[im 23/38]
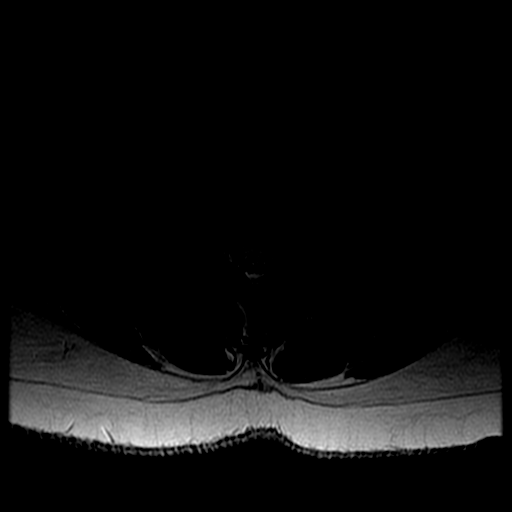
[im 33/38]
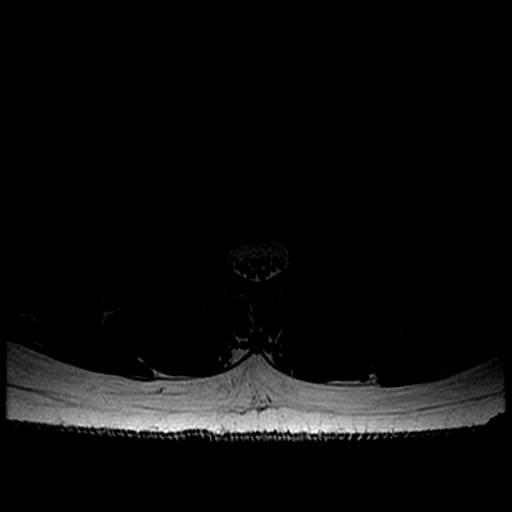

[Series 7: T1 · axial · 4.0mm · 0.39mm/px · z∈[-82,+76]mm · 3 of 38 slices shown (2 of 2)]
[im 5/38]
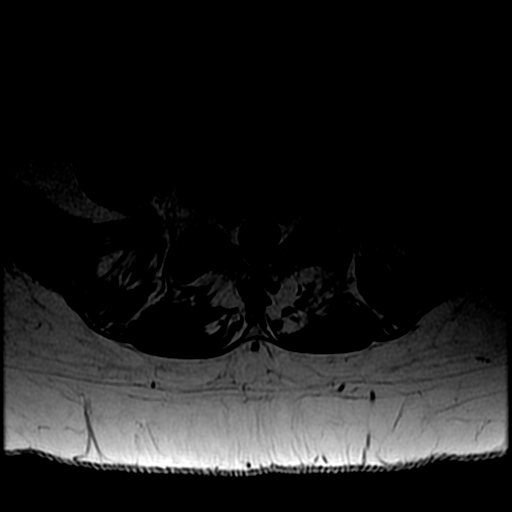
[im 20/38]
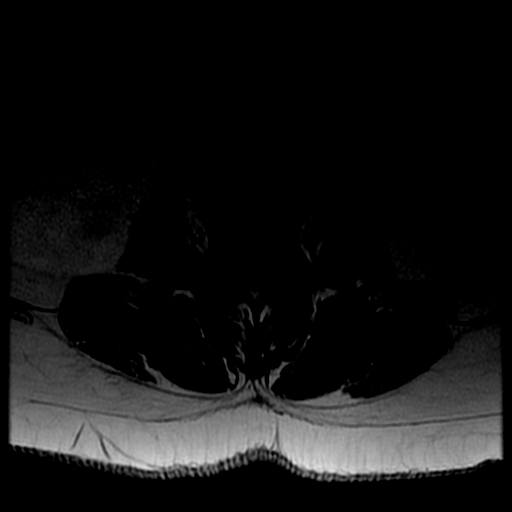
[im 33/38]
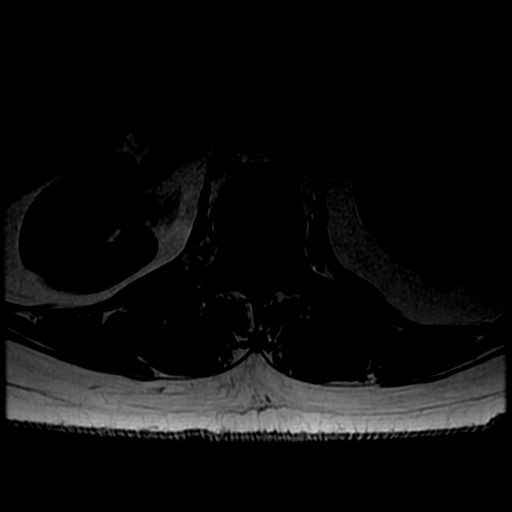

[19 of 48 positions shown; findings below may reference images not displayed]

FINDINGS: Segmentation:  Standard.

Alignment:  Physiologic.

Vertebrae:  No fracture, evidence of discitis, or bone lesion.

Conus medullaris and cauda equina: Conus extends to the T12-L1
level. Conus and cauda equina appear normal. Small sacral Tarlov
cyst at S2.

Paraspinal and other soft tissues: No acute paraspinal abnormality.

Disc levels:

Disc spaces: Degenerative disc disease with disc height loss at
T10-11, L1-2, L2-3 and to lesser extent L3-4.

T10-11: Mild broad-based disc bulge. Mild bilateral facet
arthropathy. No foraminal stenosis.

T12-L1: No significant disc bulge. No evidence of neural foraminal
stenosis. No central canal stenosis.

L1-L2: Small right paracentral disc extrusion. Mild bilateral facet
arthropathy. No evidence of neural foraminal stenosis. No central
canal stenosis.

L2-L3: Mild broad-based disc bulge. Mild bilateral facet
arthropathy. No evidence of neural foraminal stenosis. No central
canal stenosis.

L3-L4: Mild broad-based disc bulge. Mild bilateral facet
arthropathy. No evidence of neural foraminal stenosis. No central
canal stenosis.

L4-L5: Mild broad-based disc bulge. Severe bilateral facet
arthropathy. No evidence of neural foraminal stenosis. No central
canal stenosis.

L5-S1: Mild broad-based disc bulge. Moderate bilateral facet
arthropathy. No evidence of neural foraminal stenosis. No central
canal stenosis.
IMPRESSION: 1.  No acute osseous injury of the lumbar spine.
2. Mild lumbar spine spondylosis as described above.

## 2019-08-06 ENCOUNTER — Encounter: Payer: Self-pay | Admitting: Family Medicine

## 2019-08-06 DIAGNOSIS — R202 Paresthesia of skin: Secondary | ICD-10-CM

## 2019-08-06 DIAGNOSIS — R200 Anesthesia of skin: Secondary | ICD-10-CM

## 2019-08-09 ENCOUNTER — Other Ambulatory Visit: Payer: Self-pay | Admitting: Cardiovascular Disease

## 2019-08-15 MED FILL — traMADol HCL 50 MG TABS: 50 | 15 days supply | Qty: 30 | Fill #2

## 2019-08-16 ENCOUNTER — Other Ambulatory Visit: Payer: Self-pay

## 2019-08-16 ENCOUNTER — Other Ambulatory Visit: Payer: Self-pay | Admitting: Podiatry

## 2019-08-16 ENCOUNTER — Encounter: Payer: Self-pay | Admitting: Podiatry

## 2019-08-16 ENCOUNTER — Ambulatory Visit (INDEPENDENT_AMBULATORY_CARE_PROVIDER_SITE_OTHER): Payer: 59

## 2019-08-16 ENCOUNTER — Ambulatory Visit (INDEPENDENT_AMBULATORY_CARE_PROVIDER_SITE_OTHER): Payer: 59 | Admitting: Podiatry

## 2019-08-16 VITALS — BP 122/76

## 2019-08-16 DIAGNOSIS — M79672 Pain in left foot: Secondary | ICD-10-CM

## 2019-08-16 DIAGNOSIS — M79671 Pain in right foot: Secondary | ICD-10-CM

## 2019-08-16 DIAGNOSIS — M722 Plantar fascial fibromatosis: Secondary | ICD-10-CM

## 2019-08-16 NOTE — Patient Instructions (Signed)

## 2019-08-20 NOTE — Progress Notes (Signed)
Subjective:   Patient ID: Dawn Reynolds, female   DOB: 53 y.o.   MRN: [id number]   HPI Patient presents stating she has had a lot of problems with her heel and arch for a number of years and it is gradually gotten worse and she is no longer as active as she was and states that it hurts in the heel and the arch and she does want orthotics for the long-term but is also interested in trying to get this better short-term   Review of Systems  All other systems reviewed and are negative.       Objective:  Physical Exam Vitals signs and nursing note reviewed.  Constitutional:      Appearance: She is well-developed.  Pulmonary:     Effort: Pulmonary effort is normal.  Musculoskeletal: Normal range of motion.  Skin:    General: Skin is warm.  Neurological:     Mental Status: She is alert.     Neurovascular status intact muscle strength found to be adequate range of motion within normal limits with patient noted to have exquisite discomfort in the plantar fascial bilateral with inflammation fluid flatfoot deformity and mild equinus condition noted bilateral.  Patient is noted to have good digital perfusion and is well oriented x3     Assessment:  Patient has had history of knee replacement also has generalized foot pain who has inflammation pain around the plantar fascia at its insertion into the calcaneus bilateral with good digital perfusion well oriented x3     Plan:  Acute fasciitis condition bilateral with flatfoot deformity was discussed H&P x-rays reviewed.  Today I did sterile prep I injected the fascia bilateral 3 mg Kenalog 5 mg Xylocaine applied fascial brace bilateral and casted for functional orthotics to lift up the arches.  I did discuss that this will probably take time to get better and hopefully we can get her better conservatively  X-rays indicate spur formation bilateral with no indication of stress fracture noted

## 2019-08-23 DIAGNOSIS — M25561 Pain in right knee: Secondary | ICD-10-CM | POA: Diagnosis not present

## 2019-08-23 DIAGNOSIS — M25562 Pain in left knee: Secondary | ICD-10-CM | POA: Diagnosis not present

## 2019-08-23 DIAGNOSIS — M7061 Trochanteric bursitis, right hip: Secondary | ICD-10-CM | POA: Diagnosis not present

## 2019-09-20 ENCOUNTER — Ambulatory Visit: Payer: 59 | Admitting: Orthotics

## 2019-09-20 ENCOUNTER — Ambulatory Visit: Payer: 59 | Admitting: Podiatry

## 2019-09-20 ENCOUNTER — Other Ambulatory Visit: Payer: Self-pay

## 2019-09-20 DIAGNOSIS — M79671 Pain in right foot: Secondary | ICD-10-CM

## 2019-09-20 DIAGNOSIS — M722 Plantar fascial fibromatosis: Secondary | ICD-10-CM

## 2019-09-20 NOTE — Progress Notes (Signed)
Patient came in today to pick up custom made foot orthotics.  The goals were accomplished and the patient reported no dissatisfaction with said orthotics.  Patient was advised of breakin period and how to report any issues. 

## 2019-09-24 ENCOUNTER — Ambulatory Visit: Payer: 59 | Admitting: Neurology

## 2019-09-24 ENCOUNTER — Encounter: Payer: Self-pay | Admitting: Neurology

## 2019-09-24 ENCOUNTER — Telehealth: Payer: Self-pay

## 2019-09-24 NOTE — Telephone Encounter (Signed)
Pt did not show for their appt with Dr. Athar today.  

## 2019-09-26 ENCOUNTER — Other Ambulatory Visit: Payer: Self-pay | Admitting: Cardiovascular Disease

## 2019-09-26 MED FILL — CYCLOBENZAPRINE HCL 10 MG T: 10 | 30 days supply | Qty: 30 | Fill #1

## 2019-09-27 MED FILL — VASCEPA 1 GM CAPSULE: 1 | 30 days supply | Qty: 120 | Fill #0

## 2019-10-02 DIAGNOSIS — M7061 Trochanteric bursitis, right hip: Secondary | ICD-10-CM | POA: Diagnosis not present

## 2019-10-02 DIAGNOSIS — M25562 Pain in left knee: Secondary | ICD-10-CM | POA: Diagnosis not present

## 2019-10-02 DIAGNOSIS — M25561 Pain in right knee: Secondary | ICD-10-CM | POA: Diagnosis not present

## 2019-10-16 ENCOUNTER — Ambulatory Visit: Payer: 59 | Attending: Orthopedic Surgery | Admitting: Physical Therapy

## 2019-10-16 ENCOUNTER — Encounter: Payer: Self-pay | Admitting: Physical Therapy

## 2019-10-16 ENCOUNTER — Other Ambulatory Visit: Payer: Self-pay

## 2019-10-16 DIAGNOSIS — M25551 Pain in right hip: Secondary | ICD-10-CM | POA: Diagnosis not present

## 2019-10-16 DIAGNOSIS — M62838 Other muscle spasm: Secondary | ICD-10-CM | POA: Insufficient documentation

## 2019-10-16 DIAGNOSIS — R262 Difficulty in walking, not elsewhere classified: Secondary | ICD-10-CM | POA: Insufficient documentation

## 2019-10-17 ENCOUNTER — Encounter: Payer: Self-pay | Admitting: Physical Therapy

## 2019-10-17 ENCOUNTER — Encounter: Payer: Self-pay | Admitting: Family Medicine

## 2019-10-17 NOTE — Therapy (Addendum)
[place], [place], [address] Phone: [phone]   Fax:  [phone]  Physical Therapy Evaluation/Discharge   Patient Details  Name: Dawn Reynolds MRN: [id number] Date of Birth: 10/03/1966 Referring Provider (PT): Dr Frederik Pear    Encounter Date: 10/16/2019  PT End of Session - 10/17/19 0835    Visit Number  1    Number of Visits  12    Date for PT Re-Evaluation  11/28/19    Authorization Type  [facility]    PT Start Time  4332    PT Stop Time  1635    PT Time Calculation (min)  50 min    Activity Tolerance  Patient tolerated treatment well    Behavior During Therapy  [facility] for tasks assessed/performed       Past Medical History:  Diagnosis Date  . Anemia    hx of  . Arthritis   . 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 Chicken pox   . Depression    hx of in past situational  . Fibromyalgia   . GERD (gastroesophageal reflux disease)   . Headache    hx of migraines  . History of kidney stones    1996  . Hypertension     Past Surgical History:  Procedure Laterality Date  . ABDOMINAL HYSTERECTOMY    . CHOLECYSTECTOMY     03-31-18 [name]  . CHOLECYSTECTOMY N/A 03/31/2018   Procedure: LAPAROSCOPIC CHOLECYSTECTOMY;  Surgeon: Ralene Ok, MD;  Location: [facility];  Service: General;  Laterality: N/A;  . COLONOSCOPY    . endoplantar fasciotomy    . GALLBLADDER SURGERY  03/31/2018  . KNEE ARTHROSCOPY     left knee 2012  . LAPAROSCOPIC CHOLECYSTECTOMY    . REPLACEMENT TOTAL KNEE BILATERAL Bilateral 09/27/2018  . TONSILLECTOMY     1999  . TOTAL KNEE ARTHROPLASTY Bilateral 09/27/2018   Procedure: TOTAL KNEE BILATERAL;  Surgeon: Frederik Pear, MD;  Location: [facility];  Service: Orthopedics;  Laterality: Bilateral;  . UPPER GASTROINTESTINAL ENDOSCOPY      There were no vitals filed for this visit.   Subjective Assessment - 10/16/19 1556    Subjective  Patient has a long history of bilateral hip  pain. She had an injection in the left hip which helepd. her right hip is hurting her at this time. October was her last injection on the right. She feels like the pain has increased recently. She has the most pain when lies on the hip. She also has increasedpain when she sits for too long. She had bilateral knee replacements in January. Her knees are doing well. 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  depends on the day    How long can you stand comfortably?  Depends on the day    How long can you walk comfortably?  Depends on the day    Diagnostic tests  Nothing    Patient Stated Goals  to have less pain    Currently in Pain?  Yes    Pain Score  4     Pain Location  Hip    Pain Orientation  Right    Pain Descriptors / Indicators  Aching;Shooting    Pain Type  Chronic pain    Pain Onset  More than a month ago    Pain Frequency  Constant    Aggravating  Factors   standing and walking    Pain Relieving Factors  melocicam/ Ibuproifin 800    Effect of Pain on Daily Activities  difficulty sleeping         [facility] PT Assessment - 10/17/19 0001      Assessment   Medical Diagnosis  Right Hip Pain     Referring Provider (PT)  Dr Frederik Pear     Onset Date/Surgical Date  --   Several years    Hand Dominance  Right    Next MD Visit  Nothing scheduled     Prior Therapy  For bilateral knee replacements in January       Precautions   Precautions  None      Restrictions   Weight Bearing Restrictions  No      Balance Screen   Has the patient fallen in the past 6 months  No    Has the patient had a decrease in activity level because of a fear of falling?   No    Is the patient reluctant to leave their home because of a fear of falling?   No      Prior Function   Level of Independence  Independent    Vocation  Full time employment    Vocation Requirements  Works as a Marine scientist at [employer]. walks about 5000-7000 steps a day        Cognition   Overall Cognitive Status  Within Functional Limits for tasks assessed    Memory  Appears intact    Awareness  Appears intact    Problem Solving  Appears intact      Observation/Other Assessments   Focus on Therapeutic Outcomes (FOTO)   51% limitation       Sensation   Light Touch  Appears Intact    Additional Comments  denies parathesias       Coordination   Gross Motor Movements are Fluid and Coordinated  Yes    Fine Motor Movements are Fluid and Coordinated  Yes      ROM / Strength   AROM / PROM / Strength  AROM;PROM;Strength      AROM   Overall AROM Comments  Pain with active hip flexion and abduction       PROM   Overall PROM Comments  left hip flexion 120     PROM Assessment Site  Hip    Right/Left Hip  Right    Right Hip Flexion  100   with pain    Right Hip Internal Rotation   --   pain with end range      Strength   Overall Strength Comments  left 5/5     Strength Assessment Site  Hip;Knee    Right/Left Hip  Right    Right Hip Flexion  4/5    Right Hip ABduction  4/5    Right Hip ADduction  5/5    Right/Left Knee  Right    Right Knee Flexion  5/5    Right Knee Extension  5/5      Flexibility   Soft Tissue Assessment /Muscle Length  yes    Hamstrings  90/90 left 0 right -40 from straight       Palpation   Palpation comment  spasming and trigger points in her glut medius , piriiformsi and down her IT band. Significant tenderness in her torahcnteric bursa       Special Tests   Other special tests  hip scour test (-) FABER  test (-)       Ambulation/Gait   Gait Comments  ambualteds with right toe out; mild lateral movement to ther right                 Objective measurements completed on examination: See above findings.      [place] Adult PT Treatment/Exercise - 10/17/19 0001      Knee/Hip Exercises: Stretches   Passive Hamstring Stretch Limitations  seated with cuing for posutre 2x20sec hold     Piriformis Stretch  Limitations  2x20 sec hold right    Other Knee/Hip Stretches  LTR x10 each side     Other Knee/Hip Stretches  tennis ball trigger point release; roller to the IT band with cuing for pressure.              PT Education - 10/16/19 1606    Education Details  HEP, symptom mangement, anatomy of condition    Person(s) Educated  Patient    Methods  Explanation;Demonstration;Tactile cues;Verbal cues       PT Short Term Goals - 10/17/19 0850      PT SHORT TERM GOAL #1   Title  Patient will report a 50% reduction in tenderness to palpation to IT band and gluteals    Time  3    Period  Weeks    Status  New    Target Date  11/07/19      PT SHORT TERM GOAL #2   Title  Patient will demonstrate 5/5 strength gross in right LE    Time  3    Period  Weeks    Status  New    Target Date  11/07/19      PT SHORT TERM GOAL #3   Title  Patient will increase right hamstring lenmgth by 20 degrees    Time  3    Period  Weeks    Status  New    Target Date  11/07/19        PT Long Term Goals - 10/17/19 0910      PT LONG TERM GOAL #1   Title  Patient will sleep throught the night without pain    Time  6    Period  Weeks    Status  New    Target Date  11/28/19      PT LONG TERM GOAL #2   Title  Patient will ambualte 5000' without significant toe out on the right    Time  6    Period  Weeks    Status  New    Target Date  11/28/19      PT LONG TERM GOAL #3   Title  Patient will demonstrate a 37% limitation on FOTO to show improved function    Time  6    Period  Weeks    Status  New    Target Date  11/28/19             Plan - 10/17/19 0836    Clinical Impression Statement  Patient is a 53 year old female who presnts to therapy with right sided hip pain. Her pain is worse when she stis for a while and when she lies on her hip. She frequently wakes up at night 2nd topain. Sh eha ssignificant trigger points from her upper gluteals down into her lower IT band. She had bilateral  knee replacements in January. she still ambualtes with right sided toe out compared to the left which may  be contributing to her pain. She also has a ignficant difference in hamstring length from right to left.  Signs and symptoms are constent with diagnosis of torchanteric bursitis. She would benefit from skilled therapy to decrease pain and to increase right patients ability to sleep through the night.    Personal Factors and Comorbidities  Comorbidity 1;Comorbidity 2;Fitness;Comorbidity 3+    Comorbidities  bilateral knee replacements, fibromyalgia,    Examination-Activity Limitations  Sit;Sleep    Examination-Participation Restrictions  Community Activity    Stability/Clinical Decision Making  Evolving/Moderate complexity    Clinical Decision Making  Moderate    Rehab Potential  Good    PT Frequency  2x / week    PT Duration  6 weeks    PT Treatment/Interventions  ADLs/Self Care Home Management;Electrical Stimulation;Cryotherapy;Iontophoresis 10m/ml Dexamethasone;DME Instruction;Gait training;Stair training;Therapeutic activities;Therapeutic exercise;Neuromuscular re-education;Patient/family education;Manual techniques;Passive range of motion;Taping    PT Next Visit Plan  manual therapy and TPDN to reduce trigger points and mucle soreness. Begin light strengthening. Consider bridging when able. Patient toes out on the right with gait; consider standing weight shiftsing; Modalities PRN. Consdier ionto if neeed    PT Home Exercise Plan  piriformis stretch, hamstring stretch, LTR, roller toIT band; tennis ball trigger point release    Consulted and Agree with Plan of Care  Patient       Patient will benefit from skilled therapeutic intervention in order to improve the following deficits and impairments:  Abnormal gait, Decreased range of motion, Decreased mobility, Decreased strength, Decreased activity tolerance  Visit Diagnosis: Pain in right hip  Other muscle spasm  Difficulty in walking,  not elsewhere classified       PHYSICAL THERAPY DISCHARGE SUMMARY  Visits from Start of Care:   Current functional level related to goals / functional outcomes: Patient did not return for follow up    Remaining deficits: Unknown    Education / Equipment: unknown   Plan: Patient agrees to discharge.  Patient goals were not met. Patient is being discharged due to not returning since the last visit.  ?????      Problem List Patient Active Problem List   Diagnosis Date Noted  . S/P TKR (total knee replacement), bilateral 09/27/2018  . Bilateral primary osteoarthritis of knee 09/26/2018  . Obesity (BMI 35.0-39.9 without comorbidity) 04/06/2018  . High triglycerides 04/06/2018  . Osteoarthritis of left knee 12/21/2017  . Chest pain 07/09/2017  . Fibromyalgia 07/09/2017  . Hypertension 07/09/2017  . Abnormal weight gain 01/31/2014  . [place] 01/31/2014  . Other abnormal glucose 01/31/2014  . Neuropathy, peripheral 12/25/2013    DCarney LivingPT DPT  10/17/2019, 11:21 AM  [facility] [address]Phone: [phone]  Fax:  [phone] Name: Dawn FawleyMRN: [id number] of Birth: 807-03-1966

## 2019-10-22 ENCOUNTER — Ambulatory Visit: Payer: 59 | Admitting: Physical Therapy

## 2019-10-27 MED FILL — VASCEPA 1 GM CAPSULE: 1 | 30 days supply | Qty: 120 | Fill #1

## 2019-10-27 MED FILL — OMEPRAZOLE 20 MG CAP: 20 | 90 days supply | Qty: 90 | Fill #2

## 2019-10-31 DIAGNOSIS — B349 Viral infection, unspecified: Secondary | ICD-10-CM | POA: Diagnosis not present

## 2019-11-05 ENCOUNTER — Ambulatory Visit: Payer: 59 | Admitting: Physical Therapy

## 2019-11-05 ENCOUNTER — Encounter: Payer: Self-pay | Admitting: Family Medicine

## 2019-11-06 ENCOUNTER — Other Ambulatory Visit: Payer: Self-pay | Admitting: Cardiovascular Disease

## 2019-11-06 MED ORDER — AMOXICILLIN 500 MG PO CAPS: 1000.0000 mg | ORAL_CAPSULE | Freq: Two times a day (BID) | ORAL | 0 refills | Status: DC

## 2019-11-06 MED FILL — MELOXICAM 15 MG TABLET: 15 | 90 days supply | Qty: 90 | Fill #2

## 2019-11-06 MED FILL — AMOXICILLIN 500 MG CAPSULE: 500 | 10 days supply | Qty: 40 | Fill #0 | Status: TO

## 2019-11-06 MED FILL — ROSUVASTATIN CALCIUM 20 MG: 20 | 90 days supply | Qty: 90 | Fill #0

## 2019-11-06 MED FILL — HYDROCHLOROTHIAZIDE 12.5 MG: 12.5 | 90 days supply | Qty: 90 | Fill #2

## 2019-11-06 MED FILL — AMOXICILLIN 500 MG CAPSULE: 500 | 10 days supply | Qty: 40 | Fill #0

## 2019-11-06 MED FILL — AMLODIPINE BESYLATE 10 MG T: 10 | 90 days supply | Qty: 90 | Fill #2

## 2019-11-06 MED FILL — DULOXETINE HCL 60 MG CPEP: 60 | 90 days supply | Qty: 180 | Fill #2

## 2019-11-07 ENCOUNTER — Ambulatory Visit: Payer: 59 | Admitting: Physical Therapy

## 2019-11-12 ENCOUNTER — Ambulatory Visit: Payer: 59 | Admitting: Physical Therapy

## 2019-11-13 NOTE — Progress Notes (Signed)
[place] [facility] [address] [place], [place] [address] [phone]  Date:  11/14/2019   Name:  Dawn Reynolds   DOB:  05-Nov-1966   MRN:  [id number]  PCP:  Darreld Mclean, MD    Chief Complaint: No chief complaint on file.   History of Present Illness:  Dawn Reynolds Comment is a 54 y.o. very pleasant female patient who presents with the following:  Virtual visit today for follow-up Patient location is home, provider location is office.  Patient identity confirmed with 2 factors, she gives consent for virtual visit today.  The patient and myself are on the call today Patient with history of hypertension, peripheral neuropathy, arthritis of her knees, fibromyalgia, obesity  Sx started on 12/22- she did a rapid covid test 12/23- negative She was re-tested and diagnosed with COVID-19 on 12/26 She had contacted me on December 29 with ear pain-I sent in amoxicillin for her.  This did help with her ear pain She is feeling really fatigued still  Yesterday she got really tired just trying to fold laundry She has not run a fever in a week or so  She is using mucinex for congestion, tylenol for her HA She did health at work for 10 days- she is cleared time wise but she is still symptomatic and ill.   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  Will put a note in her mchart She is also looking at [facility]- she has been out for 1 continuous.  Since December 22  Her husband was actually hospitalized with his illness, he is doing better thankfully He would like to see me as a new patient which is fine, we will set this up Patient Active Problem List   Diagnosis Date Noted  . S/P TKR (total knee replacement),  bilateral 09/27/2018  . Bilateral primary osteoarthritis of knee 09/26/2018  . Obesity (BMI 35.0-39.9 without comorbidity) 04/06/2018  . High triglycerides 04/06/2018  . Osteoarthritis of left knee 12/21/2017  . Chest pain 07/09/2017  . Fibromyalgia 07/09/2017  . Hypertension 07/09/2017  . Abnormal weight gain 01/31/2014  . Empty sella syndrome ([place]) 01/31/2014  . Other abnormal glucose 01/31/2014  . Neuropathy, peripheral 12/25/2013    Past Medical History:  Diagnosis Date  . Anemia    hx of  . Arthritis   . 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 Chicken pox   . Depression    hx of in past situational  . Fibromyalgia   . GERD (gastroesophageal reflux disease)   . Headache    hx of migraines  . History of kidney stones    1996  . Hypertension     Past Surgical History:  Procedure Laterality Date  . ABDOMINAL HYSTERECTOMY    . CHOLECYSTECTOMY     03-31-18 [name]  . CHOLECYSTECTOMY N/A 03/31/2018   Procedure: LAPAROSCOPIC CHOLECYSTECTOMY;  Surgeon: Ralene Ok, MD;  Location: [facility];  Service: General;  Laterality: N/A;  . COLONOSCOPY    . endoplantar fasciotomy    . GALLBLADDER SURGERY  03/31/2018  . KNEE ARTHROSCOPY     left knee 2012  . LAPAROSCOPIC CHOLECYSTECTOMY    .  REPLACEMENT TOTAL KNEE BILATERAL Bilateral 09/27/2018  . TONSILLECTOMY     1999  . TOTAL KNEE ARTHROPLASTY Bilateral 09/27/2018   Procedure: TOTAL KNEE BILATERAL;  Surgeon: Frederik Pear, MD;  Location: [facility];  Service: Orthopedics;  Laterality: Bilateral;  . UPPER GASTROINTESTINAL ENDOSCOPY      Social History   Tobacco Use  . Smoking status: Never Smoker  . Smokeless tobacco: Never Used  Substance Use Topics  . Alcohol use: Yes    Comment: occ  . Drug use: No    Family History  Problem Relation Age of Onset  . Heart disease Father   . Prostate cancer Father   . Diabetes Father   . Irritable bowel syndrome Father   . Kidney disease Father   . Mitral valve prolapse Mother   .  Heart disease Mother   . Pancreatic cancer Maternal Aunt   . Liver cancer Paternal Uncle   . Colon cancer Paternal Uncle   . Esophageal cancer Neg Hx   . Stomach cancer Neg Hx   . Rectal cancer Neg Hx     Allergies  Allergen Reactions  . Aspartame Other (See Comments)    Headaches.  . Demerol [Meperidine] Nausea And Vomiting    Medication list has been reviewed and updated.  Current Outpatient Medications on File Prior to Visit  Medication Sig Dispense Refill  . albuterol (PROVENTIL HFA;VENTOLIN HFA) 108 (90 Base) MCG/ACT inhaler Inhale 2 puffs into the lungs every 6 (six) hours as needed for wheezing or shortness of breath. 1 Inhaler 0  . ALPRAZolam (XANAX) 0.25 MG tablet Take 1 tablet (0.25 mg total) by mouth 2 (two) times daily as needed for anxiety. 30 tablet 0  . amLODipine (NORVASC) 10 MG tablet TAKE 1 TABLET (10 MG TOTAL) BY MOUTH DAILY. 90 tablet 3  . amoxicillin (AMOXIL) 500 MG capsule Take 2 capsules (1,000 mg total) by mouth 2 (two) times daily. 40 capsule 0  . aspirin EC 81 MG tablet Take 1 tablet (81 mg total) by mouth 2 (two) times daily. 60 tablet 0  . B Complex Vitamins (B COMPLEX 1 PO) Take 1 tablet by mouth daily.     . Biotin 5000 MCG CAPS Take 5,000 mcg by mouth daily.    . Calcium Carb-Cholecalciferol (CALCIUM 600+D) 600-800 MG-UNIT TABS Take 1 tablet by mouth daily.    . cholecalciferol (VITAMIN D3) 25 MCG (1000 UT) tablet Take 1,000 Units by mouth daily.    . cyclobenzaprine (FLEXERIL) 10 MG tablet Take 10 mg by mouth at bedtime. PRN    . DULoxetine (CYMBALTA) 60 MG capsule TAKE 1 CAPSULE BY MOUTH 2 TIMES DAILY. 180 capsule 3  . Flaxseed, Linseed, (FLAX SEED OIL PO) Take 1,400 mg by mouth daily.    . Garlic (GARLIQUE PO) Take 1 tablet by mouth daily.    . hydrochlorothiazide (MICROZIDE) 12.5 MG capsule TAKE 1 CAPSULE (12.5 MG TOTAL) BY MOUTH DAILY. 90 capsule 3  . meloxicam (MOBIC) 15 MG tablet TAKE 1 TABLET (15 MG TOTAL) BY MOUTH DAILY. 90 tablet 3  .  metoprolol tartrate (LOPRESSOR) 100 MG tablet Take 1 tablet (100 mg total) by mouth once for 1 dose. DAY OF THE CT (Patient not taking: Reported on 02/20/2019) 1 tablet 0  . nitrofurantoin, macrocrystal-monohydrate, (MACROBID) 100 MG capsule Take 1 capsule daily as needed following intercourse to prevent UTI 30 capsule 0  . Omega-3 1000 MG CAPS Take 1,000 mg by mouth daily.    Marland Kitchen omeprazole (PRILOSEC) 20 MG  capsule TAKE 1 CAPSULE (20 MG TOTAL) BY MOUTH DAILY. 90 capsule 3  . rosuvastatin (CRESTOR) 20 MG tablet TAKE 1 TABLET BY MOUTH ONCE DAILY 90 tablet 1  . traMADol (ULTRAM) 50 MG tablet     . TURMERIC PO Take 1 capsule by mouth daily.    Marland Kitchen VASCEPA 1 g capsule TAKE 2 CAPSULES BY MOUTH TWICE DAILY 120 capsule 6  . vitamin C (ASCORBIC ACID) 500 MG tablet Take 500 mg by mouth daily.     No current facility-administered medications on file prior to visit.    Review of Systems:  As per HPI- otherwise negative. No fever or chills currently  Physical Examination: There were no vitals filed for this visit. There were no vitals filed for this visit. There is no height or weight on file to calculate BMI. Ideal Body Weight:    Pt observed over video- she looks well, no cough, wheezing, shortness of breath is noted Changed over to phone only eventually due to poor audio  Her sats are 93-94% at home She is doing deep breathing exercises but not using the spirometer She is not checking her BP-they do not have a cuff at home Her pulse is ok when she uses her pulse ox- 70s-80s   Assessment and Plan: COVID-19  Postviral fatigue syndrome  Virtual visit today to discuss sequelae of recent COVID-19 infection.  Chrys is feeling gradually better, but she is still very tired and weak.  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  We will have her be out for 1 more week, plan to have her return a week from today.  If she is getting worse, has any other concerns, or is not ready to return to  work at that time she will let me know.  I will also complete FMLA paperwork for her upon receipt  She has taken amoxicillin with success for ear pain and possible ear infection  Signed Lamar Blinks, MD

## 2019-11-14 ENCOUNTER — Other Ambulatory Visit: Payer: Self-pay

## 2019-11-14 ENCOUNTER — Encounter: Payer: Self-pay | Admitting: Family Medicine

## 2019-11-14 ENCOUNTER — Ambulatory Visit (INDEPENDENT_AMBULATORY_CARE_PROVIDER_SITE_OTHER): Payer: 59 | Admitting: Family Medicine

## 2019-11-14 ENCOUNTER — Other Ambulatory Visit: Payer: Self-pay | Admitting: Family Medicine

## 2019-11-14 ENCOUNTER — Ambulatory Visit: Payer: 59 | Admitting: Physical Therapy

## 2019-11-14 DIAGNOSIS — G933 Postviral fatigue syndrome: Secondary | ICD-10-CM | POA: Diagnosis not present

## 2019-11-14 DIAGNOSIS — G9331 Postviral fatigue syndrome: Secondary | ICD-10-CM

## 2019-11-14 DIAGNOSIS — U071 COVID-19: Secondary | ICD-10-CM

## 2019-11-15 ENCOUNTER — Encounter: Payer: Self-pay | Admitting: Family Medicine

## 2019-11-19 ENCOUNTER — Telehealth: Payer: Self-pay | Admitting: Family Medicine

## 2019-11-19 ENCOUNTER — Encounter: Payer: Self-pay | Admitting: Family Medicine

## 2019-11-19 NOTE — Telephone Encounter (Signed)
Received her FMLA paperwork, will fill out today Message to patient that I received her papers

## 2019-11-20 MED FILL — VASCEPA 1 GM CAPSULE: 1 | 30 days supply | Qty: 120 | Fill #2

## 2019-11-29 ENCOUNTER — Other Ambulatory Visit: Payer: Self-pay | Admitting: Family Medicine

## 2019-11-29 DIAGNOSIS — F419 Anxiety disorder, unspecified: Secondary | ICD-10-CM

## 2019-11-29 MED ORDER — ALPRAZOLAM 0.25 MG PO TABS: 0.2500 mg | ORAL_TABLET | Freq: Two times a day (BID) | ORAL | 0 refills | Status: DC | PRN

## 2019-11-29 MED FILL — ALPRAZolam 0.25 MG TABS: 0.25 | 15 days supply | Qty: 30 | Fill #0

## 2020-01-11 ENCOUNTER — Encounter: Payer: Self-pay | Admitting: Cardiovascular Disease

## 2020-01-11 ENCOUNTER — Telehealth: Payer: Self-pay | Admitting: Cardiovascular Disease

## 2020-01-11 NOTE — Telephone Encounter (Signed)
error 

## 2020-01-11 NOTE — Telephone Encounter (Signed)
   Pt c/o medication issue:  1. Name of Medication: VASCEPA 1 g capsule  2. How are you currently taking this medication (dosage and times per day)? As written  3. Are you having a reaction (difficulty breathing--STAT)?   4. What is your medication issue? Pt needs prior auth for this medication. She said she's been out for a week and a half now. She needs to get it refill asap

## 2020-01-12 ENCOUNTER — Encounter: Payer: Self-pay | Admitting: Family Medicine

## 2020-01-14 MED ORDER — TRAMADOL HCL 50 MG PO TABS: 50.0000 mg | ORAL_TABLET | Freq: Two times a day (BID) | ORAL | 1 refills | Status: DC | PRN

## 2020-01-14 MED FILL — traMADol HCL 50 MG TABS: 50 | 30 days supply | Qty: 60 | Fill #0

## 2020-01-14 NOTE — Telephone Encounter (Signed)
PMPD search turned out blank for patient, however we know that she has been using tramadol and also alprazolam.  System must be temporarily out of order.  I was able to reference a copy of her controlled substance log from her previous note.  She has been taking tramadol 50 twice daily

## 2020-01-15 MED ORDER — GABAPENTIN 100 MG PO CAPS: 100.0000 mg | ORAL_CAPSULE | Freq: Every day | ORAL | 3 refills | Status: DC

## 2020-01-15 MED FILL — GABAPENTIN 100 MG CAPSULE: 100 | 30 days supply | Qty: 90 | Fill #0

## 2020-01-15 NOTE — Telephone Encounter (Signed)
Attempted to call pt to discuss PA for vascepa. Left VM to call back. Need insurance ID number for new [employer] 3/8

## 2020-01-15 NOTE — Addendum Note (Signed)
Addended by: Lamar Blinks C on: 01/15/2020 06:44 AM   Modules accepted: Orders

## 2020-01-17 NOTE — Telephone Encounter (Signed)
Patient is following up in regards to the status of the PA for   VASCEPA 1 g capsule   medication. She states she would like to request samples of the medication. Please return call to discuss.  Patient calling the office for samples of medication:   1.  What medication and dosage are you requesting samples for?   VASCEPA 1 g capsule     2.  Are you currently out of this medication? Yes. Patient states she has been out of medication for over 2 weeks.

## 2020-01-17 NOTE — Telephone Encounter (Signed)
Samples left at front desk and pt is aware and will also bring ins card to make copy of new ID number [email or web address]

## 2020-01-24 MED FILL — OMEPRAZOLE 20 MG CAP: 20 | 30 days supply | Qty: 30 | Fill #3

## 2020-01-24 MED FILL — VASCEPA 1 GM CAPSULE: 1 | 30 days supply | Qty: 120 | Fill #3

## 2020-01-25 ENCOUNTER — Encounter: Payer: Self-pay | Admitting: Physical Therapy

## 2020-01-28 NOTE — Telephone Encounter (Signed)
Resent PA for Vascepa to plan today and was approved:  CaseId:[id number];Status:Approved;Review Type:Prior Auth;Coverage Start Date:01/28/2020;Coverage End Date:01/27/2021;

## 2020-01-28 NOTE — Telephone Encounter (Signed)
[facility] 3/22

## 2020-01-30 NOTE — Telephone Encounter (Signed)
Called and spoke with pt. Notified that the PA for Vascepa was approved. She stated that she had already had it approved from her insurance a week ago. Stated that she had called in " 3 weeks ago and hadn't heard anything."  I apologized for the delay. Pt verbalized understanding. No other questions at this time.

## 2020-02-20 MED FILL — traMADol HCL 50 MG TABS: 50 | 30 days supply | Qty: 60 | Fill #1

## 2020-02-20 MED FILL — OMEPRAZOLE 20 MG CAP: 20 | 30 days supply | Qty: 30 | Fill #4

## 2020-02-20 MED FILL — DULOXETINE HCL 60 MG CPEP: 60 | 30 days supply | Qty: 60 | Fill #3

## 2020-02-20 MED FILL — ROSUVASTATIN CALCIUM 20 MG: 20 | 30 days supply | Qty: 30 | Fill #1

## 2020-02-20 MED FILL — MELOXICAM 15 MG TABLET: 15 | 30 days supply | Qty: 30 | Fill #3

## 2020-02-20 MED FILL — GABAPENTIN 100 MG CAPSULE: 100 | 30 days supply | Qty: 90 | Fill #1

## 2020-02-20 MED FILL — VASCEPA 1 GM CAPSULE: 1 | 30 days supply | Qty: 120 | Fill #4

## 2020-03-24 MED FILL — ROSUVASTATIN CALCIUM 20 MG: 20 | 30 days supply | Qty: 30 | Fill #2

## 2020-03-24 MED FILL — OMEPRAZOLE 20 MG CAP: 20 | 30 days supply | Qty: 30 | Fill #5

## 2020-03-24 MED FILL — HYDROCHLOROTHIAZIDE 12.5 MG: 12.5 | 30 days supply | Qty: 30 | Fill #3

## 2020-03-24 MED FILL — MELOXICAM 15 MG TABLET: 15 | 30 days supply | Qty: 30 | Fill #4

## 2020-03-24 MED FILL — VASCEPA 1 GM CAPSULE: 1 | 30 days supply | Qty: 120 | Fill #5

## 2020-03-24 MED FILL — AMLODIPINE BESYLATE 10 MG T: 10 | 30 days supply | Qty: 30 | Fill #3

## 2020-03-24 MED FILL — GABAPENTIN 100 MG CAPSULE: 100 | 30 days supply | Qty: 90 | Fill #2

## 2020-04-12 ENCOUNTER — Other Ambulatory Visit: Payer: Self-pay | Admitting: Family Medicine

## 2020-04-12 MED FILL — DULOXETINE HCL 60 MG CPEP: 60 | 30 days supply | Qty: 60 | Fill #4

## 2020-04-16 ENCOUNTER — Encounter: Payer: Self-pay | Admitting: Family Medicine

## 2020-04-16 DIAGNOSIS — F419 Anxiety disorder, unspecified: Secondary | ICD-10-CM

## 2020-04-16 MED ORDER — ALPRAZOLAM 0.25 MG PO TABS: 0.2500 mg | ORAL_TABLET | Freq: Two times a day (BID) | ORAL | 1 refills | Status: DC | PRN

## 2020-05-13 ENCOUNTER — Encounter: Payer: Self-pay | Admitting: Family Medicine

## 2020-05-26 ENCOUNTER — Encounter: Payer: Managed Care, Other (non HMO) | Admitting: Family Medicine

## 2020-05-28 ENCOUNTER — Other Ambulatory Visit: Payer: Self-pay | Admitting: Family Medicine

## 2020-05-28 ENCOUNTER — Other Ambulatory Visit: Payer: Self-pay

## 2020-05-28 ENCOUNTER — Other Ambulatory Visit: Payer: Self-pay | Admitting: Cardiovascular Disease

## 2020-05-28 ENCOUNTER — Encounter: Payer: Self-pay | Admitting: Family Medicine

## 2020-05-28 DIAGNOSIS — M797 Fibromyalgia: Secondary | ICD-10-CM

## 2020-05-28 MED ORDER — OMEPRAZOLE 20 MG PO CPDR: 20.0000 mg | DELAYED_RELEASE_CAPSULE | Freq: Every day | ORAL | 3 refills | Status: DC

## 2020-05-28 MED ORDER — MELOXICAM 15 MG PO TABS: ORAL_TABLET | ORAL | 1 refills | Status: DC

## 2020-05-28 MED ORDER — HYDROCHLOROTHIAZIDE 12.5 MG PO CAPS: 12.5000 mg | ORAL_CAPSULE | Freq: Every day | ORAL | 1 refills | Status: DC

## 2020-05-28 MED ORDER — AMLODIPINE BESYLATE 10 MG PO TABS: 10.0000 mg | ORAL_TABLET | Freq: Every day | ORAL | 1 refills | Status: DC

## 2020-05-28 MED ORDER — DULOXETINE HCL 60 MG PO CPEP: ORAL_CAPSULE | ORAL | 1 refills | Status: DC

## 2020-05-28 MED ORDER — GABAPENTIN 100 MG PO CAPS: 100.0000 mg | ORAL_CAPSULE | Freq: Every day | ORAL | 3 refills | Status: DC

## 2020-05-28 MED ORDER — DULOXETINE HCL 60 MG PO CPEP: ORAL_CAPSULE | ORAL | 3 refills | Status: DC

## 2020-05-28 MED ORDER — CYCLOBENZAPRINE HCL 10 MG PO TABS: 10.0000 mg | ORAL_TABLET | Freq: Every day | ORAL | 5 refills | Status: DC

## 2020-05-28 MED FILL — DULOXETINE HCL 60 MG CPEP: 60 | 30 days supply | Qty: 60 | Fill #0

## 2020-05-28 MED FILL — VASCEPA 1 GM CAPSULE: 1 | 30 days supply | Qty: 120 | Fill #0

## 2020-05-28 MED FILL — ROSUVASTATIN CALCIUM 20 MG: 20 | 30 days supply | Qty: 30 | Fill #0

## 2020-05-28 MED FILL — GABAPENTIN 100 MG CAP: 100 | 30 days supply | Qty: 90 | Fill #0

## 2020-05-28 MED FILL — AMLODIPINE BESYLATE 10 MG T: 10 | 30 days supply | Qty: 30 | Fill #0

## 2020-05-28 MED FILL — MELOXICAM 15 MG TABLET: 15 | 30 days supply | Qty: 30 | Fill #0

## 2020-05-28 MED FILL — OMEPRAZOLE 20 MG CAP: 20 | 30 days supply | Qty: 30 | Fill #1

## 2020-05-28 MED FILL — HYDROCHLOROTHIAZIDE 12.5 MG: 12.5 | 30 days supply | Qty: 30 | Fill #0

## 2020-05-28 MED FILL — CYCLOBENZAPRINE HCL 10 MG T: 10 | 30 days supply | Qty: 30 | Fill #0

## 2020-05-28 NOTE — Addendum Note (Signed)
Addended by: Wynonia Musty A on: 05/28/2020 04:01 PM   Modules accepted: Orders

## 2020-06-02 LAB — LIPID PANEL
Cholesterol: 139 (ref 0–200)
HDL: 56 (ref 35–70)
LDL Cholesterol: 36
LDl/HDL Ratio: 2.5
Triglycerides: 229 — AB (ref 40–160)

## 2020-06-02 LAB — BASIC METABOLIC PANEL
BUN: 17 (ref 4–21)
Creatinine: 1.3 — AB (ref 0.5–1.1)
Glucose: 125

## 2020-06-02 LAB — CBC AND DIFFERENTIAL: Hemoglobin: 6.1 — AB (ref 12.0–16.0)

## 2020-06-02 LAB — HEPATIC FUNCTION PANEL
ALT: 38 — AB (ref 7–35)
AST: 35 (ref 13–35)
Alkaline Phosphatase: 75 (ref 25–125)
Bilirubin, Total: 1

## 2020-06-02 LAB — COMPREHENSIVE METABOLIC PANEL: GFR calc non Af Amer: 46

## 2020-06-11 ENCOUNTER — Telehealth: Payer: Self-pay | Admitting: Family Medicine

## 2020-06-11 ENCOUNTER — Encounter: Payer: Self-pay | Admitting: Family Medicine

## 2020-06-11 NOTE — Telephone Encounter (Signed)
Caller: Ruqayyah  Call Back # [phone]   Patient states urinary tract infection symptoms, bad odor when peeing. Patient would like provider to place an order for urine sample.

## 2020-06-12 ENCOUNTER — Encounter: Payer: Self-pay | Admitting: Family Medicine

## 2020-06-12 ENCOUNTER — Ambulatory Visit: Payer: Managed Care, Other (non HMO) | Admitting: Family Medicine

## 2020-06-12 ENCOUNTER — Other Ambulatory Visit: Payer: Self-pay

## 2020-06-12 VITALS — BP 126/80 | HR 85 | Temp 98.1°F | Resp 18 | Ht 66.0 in | Wt 260.0 lb

## 2020-06-12 DIAGNOSIS — R109 Unspecified abdominal pain: Secondary | ICD-10-CM | POA: Diagnosis not present

## 2020-06-12 DIAGNOSIS — N3000 Acute cystitis without hematuria: Secondary | ICD-10-CM

## 2020-06-12 DIAGNOSIS — R7989 Other specified abnormal findings of blood chemistry: Secondary | ICD-10-CM

## 2020-06-12 DIAGNOSIS — Z0000 Encounter for general adult medical examination without abnormal findings: Secondary | ICD-10-CM

## 2020-06-12 LAB — POCT URINALYSIS DIP (MANUAL ENTRY)
Bilirubin, UA: NEGATIVE
Glucose, UA: NEGATIVE mg/dL
Ketones, POC UA: NEGATIVE mg/dL
Leukocytes, UA: NEGATIVE
Nitrite, UA: POSITIVE — AB
Protein Ur, POC: NEGATIVE mg/dL
Spec Grav, UA: 1.015 (ref 1.010–1.025)
Urobilinogen, UA: 0.2 E.U./dL
pH, UA: 6.5 (ref 5.0–8.0)

## 2020-06-12 MED ORDER — NITROFURANTOIN MONOHYD MACRO 100 MG PO CAPS: 100.0000 mg | ORAL_CAPSULE | Freq: Two times a day (BID) | ORAL | 2 refills | Status: DC

## 2020-06-12 MED ORDER — CEPHALEXIN 500 MG PO CAPS: 500.0000 mg | ORAL_CAPSULE | Freq: Two times a day (BID) | ORAL | 0 refills | Status: DC

## 2020-06-12 NOTE — Progress Notes (Addendum)
[place] at [facility] [place], [place], [place] [id number] [phone]  Date:  06/12/2020   Name:  Dawn Reynolds   DOB:  1966-04-14   MRN:  [id number]  PCP:  Darreld Mclean, MD    Chief Complaint: Urinary Tract Infection (wbc count elevated, cloudy urine, odor, back right flank pain)   History of Present Illness:  Dawn Reynolds is a 54 y.o. very pleasant female patient who presents with the following:  Here today for periodic physical Pt contacted me recently as she had some life insurance labs- there was concern about a UTI  Pt dx with covid 19 in December of last year  We have her labs and I will abstract into her chart A1c 6.1% Lipids favorable Her creat was 1.3- higher than normal for her, and GFR 46- pt notes she spent several hours mowing outdoors prior to her labs, not sure if she may have been dehydrated at time of lab draw  No visible blood in her urine  She has some urinary frequency but no pain She has left flank pain but this is really baseline for her Her UA showed some white cells, 4 red cells per field, positive leuks She would like some macrobid to have on hand -she has used this in the past successfully for UTI Never been a smoker mammo 7/20- she will schedule her follow-up asap  Colon is UTD  S/p hysterectomy- done for fibroids. 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 She will try to get me this documentation Patient Active Problem List   Diagnosis Date Noted  . S/P TKR (total knee replacement), bilateral 09/27/2018  . Bilateral primary osteoarthritis of knee 09/26/2018  . Obesity (BMI 35.0-39.9 without comorbidity) 04/06/2018  . High triglycerides 04/06/2018  . Osteoarthritis of left knee 12/21/2017  . Chest pain 07/09/2017  . Fibromyalgia 07/09/2017  .  Hypertension 07/09/2017  . Abnormal weight gain 01/31/2014  . Empty sella syndrome ([place]) 01/31/2014  . Other abnormal glucose 01/31/2014  . Neuropathy, peripheral 12/25/2013    Past Medical History:  Diagnosis Date  . Anemia    hx of  . Arthritis   . 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 Chicken pox   . Depression    hx of in past situational  . Fibromyalgia   . GERD (gastroesophageal reflux disease)   . Headache    hx of migraines  . History of kidney stones    1996  . Hypertension     Past Surgical History:  Procedure Laterality Date  . ABDOMINAL HYSTERECTOMY    . CHOLECYSTECTOMY     03-31-18 [name]  . CHOLECYSTECTOMY N/A 03/31/2018   Procedure: LAPAROSCOPIC CHOLECYSTECTOMY;  Surgeon: Ralene Ok, MD;  Location: [facility];  Service: General;  Laterality: N/A;  . COLONOSCOPY    . endoplantar fasciotomy    . GALLBLADDER SURGERY  03/31/2018  . KNEE ARTHROSCOPY     left knee 2012  . LAPAROSCOPIC CHOLECYSTECTOMY    . REPLACEMENT TOTAL KNEE BILATERAL Bilateral 09/27/2018  . TONSILLECTOMY     1999  . TOTAL KNEE ARTHROPLASTY Bilateral 09/27/2018   Procedure: TOTAL KNEE BILATERAL;  Surgeon: Frederik Pear, MD;  Location: [facility];  Service: Orthopedics;  Laterality: Bilateral;  . UPPER GASTROINTESTINAL ENDOSCOPY  Social History   Tobacco Use  . Smoking status: Never Smoker  . Smokeless tobacco: Never Used  Vaping Use  . Vaping Use: Never used  Substance Use Topics  . Alcohol use: Yes    Comment: occ  . Drug use: No    Family History  Problem Relation Age of Onset  . Heart disease Father   . Prostate cancer Father   . Diabetes Father   . Irritable bowel syndrome Father   . Kidney disease Father   . Mitral valve prolapse Mother   . Heart disease Mother   . Pancreatic cancer Maternal Aunt   . Liver cancer Paternal Uncle   . Colon cancer Paternal Uncle   . Esophageal cancer Neg Hx   . Stomach cancer Neg Hx   . Rectal cancer Neg Hx     Allergies   Allergen Reactions  . Aspartame Other (See Comments)    Headaches.  . Demerol [Meperidine] Nausea And Vomiting    Medication list has been reviewed and updated.  Current Outpatient Medications on File Prior to Visit  Medication Sig Dispense Refill  . ALPRAZolam (XANAX) 0.25 MG tablet Take 1 tablet (0.25 mg total) by mouth 2 (two) times daily as needed for anxiety. 30 tablet 1  . amLODipine (NORVASC) 10 MG tablet Take 1 tablet (10 mg total) by mouth daily. 90 tablet 1  . aspirin EC 81 MG tablet Take 1 tablet (81 mg total) by mouth 2 (two) times daily. 60 tablet 0  . B Complex Vitamins (B COMPLEX 1 PO) Take 1 tablet by mouth daily.     . Biotin 5000 MCG CAPS Take 5,000 mcg by mouth daily.    . Calcium Carb-Cholecalciferol (CALCIUM 600+D) 600-800 MG-UNIT TABS Take 1 tablet by mouth daily.    . cholecalciferol (VITAMIN D3) 25 MCG (1000 UT) tablet Take 1,000 Units by mouth daily.    . cyclobenzaprine (FLEXERIL) 10 MG tablet Take 1 tablet (10 mg total) by mouth at bedtime. PRN 30 tablet 5  . DULoxetine (CYMBALTA) 60 MG capsule TAKE 1 CAPSULE BY MOUTH 2 TIMES DAILY. 180 capsule 3  . gabapentin (NEURONTIN) 100 MG capsule Take 1 capsule (100 mg total) by mouth at bedtime. May increase to 300 as needed 90 capsule 3  . hydrochlorothiazide (MICROZIDE) 12.5 MG capsule Take 1 capsule (12.5 mg total) by mouth daily. 90 capsule 1  . meloxicam (MOBIC) 15 MG tablet TAKE 1 TABLET (15 MG TOTAL) BY MOUTH DAILY. 90 tablet 1  . omeprazole (PRILOSEC) 20 MG capsule Take 1 capsule (20 mg total) by mouth daily. 90 capsule 3  . rosuvastatin (CRESTOR) 20 MG tablet TAKE 1 TABLET BY MOUTH ONCE DAILY 30 tablet 3  . traMADol (ULTRAM) 50 MG tablet Take 1 tablet (50 mg total) by mouth every 12 (twelve) hours as needed. 60 tablet 1  . TURMERIC PO Take 1 capsule by mouth daily.    Marland Kitchen VASCEPA 1 g capsule TAKE 2 CAPSULES BY MOUTH TWICE DAILY 120 capsule 6  . vitamin C (ASCORBIC ACID) 500 MG tablet Take 500 mg by mouth daily.      No current facility-administered medications on file prior to visit.    Review of Systems:  As per HPI- otherwise negative. Waist 42 inches   Physical Examination: Vitals:   06/12/20 1459  BP: 126/80  Pulse: 85  Resp: 18  Temp: 98.1 F (36.7 C)  SpO2: 97%   Vitals:   06/12/20 1459  Weight: 260 lb (117.9 kg)  Height: 5\' 6"  (1.676 m)   Body mass index is 41.97 kg/m. Ideal Body Weight: Weight in (lb) to have BMI = 25: 154.6  GEN: no acute distress. Obese, otherwise looks well HEENT: Atraumatic, Normocephalic.   Bilateral TM wnl, oropharynx normal.  PEERL,EOMI.   Ears and Nose: No external deformity. CV: RRR, No M/G/R. No JVD. No thrill. No extra heart sounds. PULM: CTA B, no wheezes, crackles, rhonchi. No retractions. No resp. distress. No accessory muscle use. ABD: S, NT, ND, +BS. No rebound. No HSM. No concern CVA tenderness EXTR: No c/c/e PSYCH: Normally interactive. Conversant.   Results for orders placed or performed in visit on 06/12/20  Urine Culture   Specimen: Urine  Result Value Ref Range   MICRO NUMBER: [id number]    SPECIMEN QUALITY: Adequate    Sample Source NOT GIVEN    STATUS: FINAL    ISOLATE 1: Escherichia coli (A)       Susceptibility   Escherichia coli - URINE CULTURE, REFLEX    AMOX/CLAVULANIC <=2 Sensitive     AMPICILLIN <=2 Sensitive     AMPICILLIN/SULBACTAM <=2 Sensitive     CEFAZOLIN* <=4 Not Reportable      * For infections other than uncomplicated UTIcaused by E. coli, K. pneumoniae or P. mirabilis:Cefazolin is resistant if MIC > or = 8 mcg/mL.(Distinguishing susceptible versus intermediatefor isolates with MIC < or = 4 mcg/mL requiresadditional testing.)For uncomplicated UTI caused by E. coli,K. pneumoniae or P. mirabilis: Cefazolin issusceptible if MIC <32 mcg/mL and predictssusceptible to the oral agents cefaclor, cefdinir,cefpodoxime, cefprozil, cefuroxime, cephalexinand loracarbef.    CEFEPIME <=1 Sensitive     CEFTRIAXONE <=1  Sensitive     CIPROFLOXACIN <=0.25 Sensitive     LEVOFLOXACIN 0.5 Sensitive     ERTAPENEM <=0.5 Sensitive     GENTAMICIN <=1 Sensitive     IMIPENEM <=0.25 Sensitive     NITROFURANTOIN <=16 Sensitive     PIP/TAZO <=4 Sensitive     TOBRAMYCIN <=1 Sensitive     TRIMETH/SULFA* <=20 Sensitive      * For infections other than uncomplicated UTIcaused by E. coli, K. pneumoniae or P. mirabilis:Cefazolin is resistant if MIC > or = 8 mcg/mL.(Distinguishing susceptible versus intermediatefor isolates with MIC < or = 4 mcg/mL requiresadditional testing.)For uncomplicated UTI caused by E. coli,K. pneumoniae or P. mirabilis: Cefazolin issusceptible if MIC <32 mcg/mL and predictssusceptible to the oral agents cefaclor, cefdinir,cefpodoxime, cefprozil, cefuroxime, cephalexinand loracarbef.Legend:S = Susceptible  I = IntermediateR = Resistant  NS = Not susceptible* = Not tested  NR = Not reported**NN = See antimicrobic comments  Urine Microscopic Only  Result Value Ref Range   WBC, UA 0-2/hpf 0-2/hpf   RBC / HPF 0-2/hpf 0-2/hpf   Squamous Epithelial / LPF Rare(0-4/hpf) Rare(0-4/hpf)   Renal Epithel, UA Rare(0-4/hpf) (A) None   Bacteria, UA 3+ (A) None  Basic metabolic panel  Result Value Ref Range   Sodium 140 135 - 145 mEq/L   Potassium 3.8 3.5 - 5.1 mEq/L   Chloride 106 96 - 112 mEq/L   CO2 27 19 - 32 mEq/L   Glucose, Bld 100 (H) 70 - 99 mg/dL   BUN 15 6 - 23 mg/dL   Creatinine, Ser 0.86 0.40 - 1.20 mg/dL   GFR 68.77 >60.00 mL/min   Calcium 9.3 8.4 - 10.5 mg/dL  POCT urinalysis dipstick  Result Value Ref Range   Color, UA yellow yellow   Clarity, UA cloudy (A) clear   Glucose, UA negative negative mg/dL  Bilirubin, UA negative negative   Ketones, POC UA negative negative mg/dL   Spec Grav, UA 1.015 1.010 - 1.025   Blood, UA moderate (A) negative   pH, UA 6.5 5.0 - 8.0   Protein Ur, POC negative negative mg/dL   Urobilinogen, UA 0.2 0.2 or 1.0 E.U./dL   Nitrite, UA Positive (A) Negative    Leukocytes, UA Negative Negative    Assessment and Plan: Physical exam  Flank pain - Plan: POCT urinalysis dipstick, Urine Culture, Urine Microscopic Only, DISCONTINUED: nitrofurantoin, macrocrystal-monohydrate, (MACROBID) 100 MG capsule  Elevated serum creatinine - Plan: Basic metabolic panel  Acute cystitis without hematuria - Plan: cephALEXin (KEFLEX) 500 MG capsule  Here today for physical exam Recent labs done per health insurance There was some concern about possible UTI, also microhematuria Her UA today does suggest possible UTI We'll treat with Keflex-had originally planned for Macrobid but noted GFR less than 60 on her recent health insurance labs so avoid Await urine culture We'll also recheck her BMP as creatinine was higher than normal for her We discussed health maintenance, exercise and diet If she does not have a UTI, will need to consider further evaluation of microhematuria  This visit occurred during the SARS-CoV-2 public health emergency.  Safety protocols were in place, including screening questions prior to the visit, additional usage of staff PPE, and extensive cleaning of exam room while observing appropriate contact time as indicated for disinfecting solutions.     Signed Lamar Blinks, MD  addnd 8/6- received BW as below- renal function back to normal No reds on micro   Good news- your kidney function is back to normal!  Your urine microscope exam did NOT show any abnormal red blood cells, but did show bacteria!  UTI is suspected, just waiting on your culture Results for orders placed or performed in visit on 06/12/20  Urine Culture   Specimen: Urine  Result Value Ref Range   MICRO NUMBER: [id number]    SPECIMEN QUALITY: Adequate    Sample Source NOT GIVEN    STATUS: FINAL    ISOLATE 1: Escherichia coli (A)       Susceptibility   Escherichia coli - URINE CULTURE, REFLEX    AMOX/CLAVULANIC <=2 Sensitive     AMPICILLIN <=2 Sensitive      AMPICILLIN/SULBACTAM <=2 Sensitive     CEFAZOLIN* <=4 Not Reportable      * For infections other than uncomplicated UTIcaused by E. coli, K. pneumoniae or P. mirabilis:Cefazolin is resistant if MIC > or = 8 mcg/mL.(Distinguishing susceptible versus intermediatefor isolates with MIC < or = 4 mcg/mL requiresadditional testing.)For uncomplicated UTI caused by E. coli,K. pneumoniae or P. mirabilis: Cefazolin issusceptible if MIC <32 mcg/mL and predictssusceptible to the oral agents cefaclor, cefdinir,cefpodoxime, cefprozil, cefuroxime, cephalexinand loracarbef.    CEFEPIME <=1 Sensitive     CEFTRIAXONE <=1 Sensitive     CIPROFLOXACIN <=0.25 Sensitive     LEVOFLOXACIN 0.5 Sensitive     ERTAPENEM <=0.5 Sensitive     GENTAMICIN <=1 Sensitive     IMIPENEM <=0.25 Sensitive     NITROFURANTOIN <=16 Sensitive     PIP/TAZO <=4 Sensitive     TOBRAMYCIN <=1 Sensitive     TRIMETH/SULFA* <=20 Sensitive      * For infections other than uncomplicated UTIcaused by E. coli, K. pneumoniae or P. mirabilis:Cefazolin is resistant if MIC > or = 8 mcg/mL.(Distinguishing susceptible versus intermediatefor isolates with MIC < or = 4 mcg/mL requiresadditional testing.)For uncomplicated UTI caused by E. coli,K.  pneumoniae or P. mirabilis: Cefazolin issusceptible if MIC <32 mcg/mL and predictssusceptible to the oral agents cefaclor, cefdinir,cefpodoxime, cefprozil, cefuroxime, cephalexinand loracarbef.Legend:S = Susceptible  I = IntermediateR = Resistant  NS = Not susceptible* = Not tested  NR = Not reported**NN = See antimicrobic comments  Urine Microscopic Only  Result Value Ref Range   WBC, UA 0-2/hpf 0-2/hpf   RBC / HPF 0-2/hpf 0-2/hpf   Squamous Epithelial / LPF Rare(0-4/hpf) Rare(0-4/hpf)   Renal Epithel, UA Rare(0-4/hpf) (A) None   Bacteria, UA 3+ (A) None  Basic metabolic panel  Result Value Ref Range   Sodium 140 135 - 145 mEq/L   Potassium 3.8 3.5 - 5.1 mEq/L   Chloride 106 96 - 112 mEq/L   CO2 27 19 - 32 mEq/L    Glucose, Bld 100 (H) 70 - 99 mg/dL   BUN 15 6 - 23 mg/dL   Creatinine, Ser 0.86 0.40 - 1.20 mg/dL   GFR 68.77 >60.00 mL/min   Calcium 9.3 8.4 - 10.5 mg/dL  POCT urinalysis dipstick  Result Value Ref Range   Color, UA yellow yellow   Clarity, UA cloudy (A) clear   Glucose, UA negative negative mg/dL   Bilirubin, UA negative negative   Ketones, POC UA negative negative mg/dL   Spec Grav, UA 1.015 1.010 - 1.025   Blood, UA moderate (A) negative   pH, UA 6.5 5.0 - 8.0   Protein Ur, POC negative negative mg/dL   Urobilinogen, UA 0.2 0.2 or 1.0 E.U./dL   Nitrite, UA Positive (A) Negative   Leukocytes, UA Negative Negative    Received urine culture 8/8- positive for pan sensitive E coli Message to pt

## 2020-06-12 NOTE — Telephone Encounter (Signed)
Patient coming in for visit today at 3:00.

## 2020-06-12 NOTE — Patient Instructions (Signed)
Good to see you again today- I will be in touch with your urine follow-up and BMP For the time being let's treat for presumed UTI with macrobid twice a day for one week I am glad to complete a physical exam for you at your convenience Please think about getting the shingles vaccine at your convenience  Don't forget your mammogram this year!    Health Maintenance, Female Adopting a healthy lifestyle and getting preventive care are important in promoting health and wellness. Ask your health care provider about:  The right schedule for you to have regular tests and exams.  Things you can do on your own to prevent diseases and keep yourself healthy. What should I know about diet, weight, and exercise? Eat a healthy diet   Eat a diet that includes plenty of vegetables, fruits, low-fat dairy products, and lean protein.  Do not eat a lot of foods that are high in solid fats, added sugars, or sodium. Maintain a healthy weight Body mass index (BMI) is used to identify weight problems. It estimates body fat based on height and weight. Your health care provider can help determine your BMI and help you achieve or maintain a healthy weight. Get regular exercise Get regular exercise. This is one of the most important things you can do for your health. Most adults should:  Exercise for at least 150 minutes each week. The exercise should increase your heart rate and make you sweat (moderate-intensity exercise).  Do strengthening exercises at least twice a week. This is in addition to the moderate-intensity exercise.  Spend less time sitting. Even light physical activity can be beneficial. Watch cholesterol and blood lipids Have your blood tested for lipids and cholesterol at 54 years of age, then have this test every 5 years. Have your cholesterol levels checked more often if:  Your lipid or cholesterol levels are high.  You are older than 54 years of age.  You are at high risk for heart  disease. What should I know about cancer screening? Depending on your health history and family history, you may need to have cancer screening at various ages. This may include screening for:  Breast cancer.  Cervical cancer.  Colorectal cancer.  Skin cancer.  Lung cancer. What should I know about heart disease, diabetes, and high blood pressure? Blood pressure and heart disease  High blood pressure causes heart disease and increases the risk of stroke. This is more likely to develop in people who have high blood pressure readings, are of African descent, or are overweight.  Have your blood pressure checked: ? Every 3-5 years if you are 70-50 years of age. ? Every year if you are 39 years old or older. Diabetes Have regular diabetes screenings. This checks your fasting blood sugar level. Have the screening done:  Once every three years after age 86 if you are at a normal weight and have a low risk for diabetes.  More often and at a younger age if you are overweight or have a high risk for diabetes. What should I know about preventing infection? Hepatitis B If you have a higher risk for hepatitis B, you should be screened for this virus. Talk with your health care provider to find out if you are at risk for hepatitis B infection. Hepatitis C Testing is recommended for:  Everyone born from [birth year] through 1965.  Anyone with known risk factors for hepatitis C. Sexually transmitted infections (STIs)  Get screened for STIs, including gonorrhea and chlamydia,  if: ? You are sexually active and are younger than 54 years of age. ? You are older than 54 years of age and your health care provider tells you that you are at risk for this type of infection. ? Your sexual activity has changed since you were last screened, and you are at increased risk for chlamydia or gonorrhea. Ask your health care provider if you are at risk.  Ask your health care provider about whether you are at high  risk for HIV. Your health care provider may recommend a prescription medicine to help prevent HIV infection. If you choose to take medicine to prevent HIV, you should first get tested for HIV. You should then be tested every 3 months for as long as you are taking the medicine. Pregnancy  If you are about to stop having your period (premenopausal) and you may become pregnant, seek counseling before you get pregnant.  Take 400 to 800 micrograms (mcg) of folic acid every day if you become pregnant.  Ask for birth control (contraception) if you want to prevent pregnancy. Osteoporosis and menopause Osteoporosis is a disease in which the bones lose minerals and strength with aging. This can result in bone fractures. If you are 69 years old or older, or if you are at risk for osteoporosis and fractures, ask your health care provider if you should:  Be screened for bone loss.  Take a calcium or vitamin D supplement to lower your risk of fractures.  Be given hormone replacement therapy (HRT) to treat symptoms of menopause. Follow these instructions at home: Lifestyle  Do not use any products that contain nicotine or tobacco, such as cigarettes, e-cigarettes, and chewing tobacco. If you need help quitting, ask your health care provider.  Do not use street drugs.  Do not share needles.  Ask your health care provider for help if you need support or information about quitting drugs. Alcohol use  Do not drink alcohol if: ? Your health care provider tells you not to drink. ? You are pregnant, may be pregnant, or are planning to become pregnant.  If you drink alcohol: ? Limit how much you use to 0-1 drink a day. ? Limit intake if you are breastfeeding.  Be aware of how much alcohol is in your drink. In the [place] one drink equals one 12 oz bottle of beer (355 mL), one 5 oz glass of wine (148 mL), or one 1 oz glass of hard liquor (44 mL). General instructions  Schedule regular health, dental,  and eye exams.  Stay current with your vaccines.  Tell your health care provider if: ? You often feel depressed. ? You have ever been abused or do not feel safe at home. Summary  Adopting a healthy lifestyle and getting preventive care are important in promoting health and wellness.  Follow your health care provider's instructions about healthy diet, exercising, and getting tested or screened for diseases.  Follow your health care provider's instructions on monitoring your cholesterol and blood pressure. This information is not intended to replace advice given to you by your health care provider. Make sure you discuss any questions you have with your health care provider. Document Revised: 10/18/2018 Document Reviewed: 10/18/2018 Elsevier Patient Education  2020 [employer].

## 2020-06-13 LAB — URINALYSIS, MICROSCOPIC ONLY

## 2020-06-13 LAB — BASIC METABOLIC PANEL
BUN: 15 mg/dL (ref 6–23)
CO2: 27 mEq/L (ref 19–32)
Calcium: 9.3 mg/dL (ref 8.4–10.5)
Chloride: 106 mEq/L (ref 96–112)
Creatinine, Ser: 0.86 mg/dL (ref 0.40–1.20)
GFR: 68.77 mL/min (ref >60.00)
Glucose, Bld: 100 mg/dL — ABNORMAL HIGH (ref 70–99)
Potassium: 3.8 mEq/L (ref 3.5–5.1)
Sodium: 140 mEq/L (ref 135–145)

## 2020-06-15 LAB — URINE CULTURE
MICRO NUMBER:: 10795488
SPECIMEN QUALITY:: ADEQUATE

## 2020-07-01 MED FILL — ROSUVASTATIN CALCIUM 20 MG: 20 | 30 days supply | Qty: 30 | Fill #1

## 2020-07-01 MED FILL — OMEPRAZOLE 20 MG CAP: 20 | 30 days supply | Qty: 30 | Fill #2

## 2020-07-01 MED FILL — HYDROCHLOROTHIAZIDE 12.5 MG: 12.5 | 30 days supply | Qty: 30 | Fill #1

## 2020-07-01 MED FILL — MELOXICAM 15 MG TABLET: 15 | 30 days supply | Qty: 30 | Fill #1

## 2020-07-01 MED FILL — GABAPENTIN 100 MG CAPSULE: 100 | 30 days supply | Qty: 90 | Fill #1

## 2020-07-01 MED FILL — DULOXETINE HCL 60 MG CPEP: 60 | 30 days supply | Qty: 60 | Fill #1

## 2020-07-01 MED FILL — VASCEPA 1 GM CAPSULE: 1 | 30 days supply | Qty: 120 | Fill #1

## 2020-07-01 MED FILL — AMLODIPINE BESYLATE 10 MG T: 10 | 30 days supply | Qty: 30 | Fill #1

## 2020-07-13 ENCOUNTER — Encounter: Payer: Self-pay | Admitting: Family Medicine

## 2020-07-31 MED FILL — OMEPRAZOLE 20 MG CAP: 20 | 30 days supply | Qty: 30 | Fill #3

## 2020-07-31 MED FILL — GABAPENTIN 100 MG CAPSULE: 100 | 30 days supply | Qty: 90 | Fill #2

## 2020-07-31 MED FILL — ROSUVASTATIN CALCIUM 20 MG: 20 | 30 days supply | Qty: 30 | Fill #2

## 2020-07-31 MED FILL — VASCEPA 1 GM CAPSULE: 1 | 30 days supply | Qty: 120 | Fill #2

## 2020-07-31 MED FILL — MELOXICAM 15 MG TABLET: 15 | 30 days supply | Qty: 30 | Fill #2

## 2020-07-31 MED FILL — DULOXETINE HCL 60 MG CPEP: 60 | 30 days supply | Qty: 60 | Fill #2

## 2020-07-31 MED FILL — HYDROCHLOROTHIAZIDE 12.5 MG: 12.5 | 30 days supply | Qty: 30 | Fill #2

## 2020-07-31 MED FILL — AMLODIPINE BESYLATE 10 MG T: 10 | 30 days supply | Qty: 30 | Fill #2

## 2020-08-11 ENCOUNTER — Other Ambulatory Visit: Payer: Self-pay | Admitting: Family Medicine

## 2020-08-11 DIAGNOSIS — Z1231 Encounter for screening mammogram for malignant neoplasm of breast: Secondary | ICD-10-CM

## 2020-08-13 ENCOUNTER — Encounter: Payer: Self-pay | Admitting: Family Medicine

## 2020-08-13 NOTE — Telephone Encounter (Signed)
Printed form, placed in provider folder for signature

## 2020-08-27 ENCOUNTER — Encounter: Payer: Self-pay | Admitting: Family Medicine

## 2020-08-27 DIAGNOSIS — Z809 Family history of malignant neoplasm, unspecified: Secondary | ICD-10-CM

## 2020-08-27 DIAGNOSIS — R928 Other abnormal and inconclusive findings on diagnostic imaging of breast: Secondary | ICD-10-CM

## 2020-08-28 ENCOUNTER — Ambulatory Visit
Admission: RE | Admit: 2020-08-28 | Discharge: 2020-08-28 | Disposition: A | Discharge status: Home / Self Care | Payer: Managed Care, Other (non HMO) | Source: Ambulatory Visit | Attending: Family Medicine | Admitting: Family Medicine

## 2020-08-28 ENCOUNTER — Telehealth: Payer: Self-pay | Admitting: Genetic Counselor

## 2020-08-28 ENCOUNTER — Other Ambulatory Visit: Payer: Self-pay

## 2020-08-28 DIAGNOSIS — Z1231 Encounter for screening mammogram for malignant neoplasm of breast: Secondary | ICD-10-CM

## 2020-08-28 MED FILL — AMLODIPINE BESYLATE 10 MG T: 10 | 30 days supply | Qty: 30 | Fill #3

## 2020-08-28 MED FILL — GABAPENTIN 100 MG CAPSULE: 100 | 30 days supply | Qty: 90 | Fill #3

## 2020-08-28 MED FILL — HYDROCHLOROTHIAZIDE 12.5 MG: 12.5 | 30 days supply | Qty: 30 | Fill #3

## 2020-08-28 MED FILL — ROSUVASTATIN CALCIUM 20 MG: 20 | 30 days supply | Qty: 30 | Fill #3

## 2020-08-28 MED FILL — VASCEPA 1 GM CAPSULE: 1 | 30 days supply | Qty: 120 | Fill #3

## 2020-08-28 MED FILL — MELOXICAM 15 MG TABLET: 15 | 30 days supply | Qty: 30 | Fill #3

## 2020-08-28 MED FILL — OMEPRAZOLE 20 MG CAP: 20 | 30 days supply | Qty: 30 | Fill #0

## 2020-08-28 MED FILL — DULOXETINE HCL 60 MG CPEP: 60 | 30 days supply | Qty: 60 | Fill #3

## 2020-08-28 NOTE — Telephone Encounter (Signed)
Received a genetic counseling referral from Dr. Lorelei Pont for fhx of breast cancer. Pt has been scheduled to see Santiago Glad on 11/15 at 2pm. Letter mailed.

## 2020-09-01 NOTE — Addendum Note (Signed)
Addended by: Lamar Blinks C on: 09/01/2020 05:43 AM   Modules accepted: Orders

## 2020-09-19 ENCOUNTER — Telehealth: Payer: Self-pay | Admitting: Genetic Counselor

## 2020-09-19 NOTE — Telephone Encounter (Signed)
Dawn Reynolds has been rescheduled to see Raquel Sarna on 12/7 at 2pm. Pt aware to arrive 15 minutes early.

## 2020-09-22 ENCOUNTER — Inpatient Hospital Stay: Payer: Managed Care, Other (non HMO)

## 2020-09-22 ENCOUNTER — Inpatient Hospital Stay: Payer: Managed Care, Other (non HMO) | Admitting: Genetic Counselor

## 2020-09-23 ENCOUNTER — Other Ambulatory Visit: Payer: Self-pay | Admitting: Family Medicine

## 2020-09-23 ENCOUNTER — Other Ambulatory Visit: Payer: Self-pay

## 2020-09-23 ENCOUNTER — Ambulatory Visit
Admission: RE | Admit: 2020-09-23 | Discharge: 2020-09-23 | Disposition: A | Discharge status: Home / Self Care | Payer: Managed Care, Other (non HMO) | Source: Ambulatory Visit | Attending: Family Medicine | Admitting: Family Medicine

## 2020-09-23 DIAGNOSIS — R928 Other abnormal and inconclusive findings on diagnostic imaging of breast: Secondary | ICD-10-CM

## 2020-09-26 ENCOUNTER — Other Ambulatory Visit: Payer: Self-pay

## 2020-09-26 ENCOUNTER — Ambulatory Visit
Admission: RE | Admit: 2020-09-26 | Discharge: 2020-09-26 | Disposition: A | Discharge status: Home / Self Care | Payer: Managed Care, Other (non HMO) | Source: Ambulatory Visit | Attending: Family Medicine | Admitting: Family Medicine

## 2020-09-26 ENCOUNTER — Other Ambulatory Visit: Payer: Self-pay | Admitting: Family Medicine

## 2020-09-26 DIAGNOSIS — R928 Other abnormal and inconclusive findings on diagnostic imaging of breast: Secondary | ICD-10-CM

## 2020-10-03 ENCOUNTER — Other Ambulatory Visit: Payer: Self-pay | Admitting: Cardiovascular Disease

## 2020-10-03 ENCOUNTER — Other Ambulatory Visit: Payer: Self-pay | Admitting: Family Medicine

## 2020-10-03 MED FILL — HYDROCHLOROTHIAZIDE 12.5 MG: 12.5 | 30 days supply | Qty: 30 | Fill #4

## 2020-10-03 MED FILL — AMLODIPINE BESYLATE 10 MG T: 10 | 30 days supply | Qty: 30 | Fill #4

## 2020-10-03 MED FILL — VASCEPA 1 GM CAPSULE: 1 | 30 days supply | Qty: 120 | Fill #4

## 2020-10-03 MED FILL — DULOXETINE HCL 60 MG CPEP: 60 | 30 days supply | Qty: 60 | Fill #4

## 2020-10-03 MED FILL — ROSUVASTATIN CALCIUM 20 MG: 20 | 30 days supply | Qty: 30 | Fill #0

## 2020-10-03 MED FILL — OMEPRAZOLE 20 MG CAP: 20 | 30 days supply | Qty: 30 | Fill #1

## 2020-10-03 MED FILL — MELOXICAM 15 MG TABLET: 15 | 30 days supply | Qty: 30 | Fill #4

## 2020-10-04 MED FILL — GABAPENTIN 100 MG CAPSULE: 100 | 30 days supply | Qty: 90 | Fill #0

## 2020-10-14 ENCOUNTER — Other Ambulatory Visit: Payer: Managed Care, Other (non HMO)

## 2020-10-14 ENCOUNTER — Encounter: Payer: Managed Care, Other (non HMO) | Admitting: Genetic Counselor

## 2020-10-14 ENCOUNTER — Encounter: Payer: Self-pay | Admitting: Family Medicine

## 2020-10-14 MED ORDER — TRAMADOL HCL 50 MG PO TABS: 50.0000 mg | ORAL_TABLET | Freq: Two times a day (BID) | ORAL | 1 refills | Status: DC | PRN

## 2020-10-24 ENCOUNTER — Ambulatory Visit (INDEPENDENT_AMBULATORY_CARE_PROVIDER_SITE_OTHER): Payer: Managed Care, Other (non HMO) | Admitting: Family Medicine

## 2020-10-24 ENCOUNTER — Encounter: Payer: Self-pay | Admitting: Family Medicine

## 2020-10-24 ENCOUNTER — Other Ambulatory Visit: Payer: Self-pay

## 2020-10-24 VITALS — BP 128/84 | HR 67 | Temp 98.1°F | Ht 66.0 in | Wt 270.1 lb

## 2020-10-24 DIAGNOSIS — G6289 Other specified polyneuropathies: Secondary | ICD-10-CM

## 2020-10-24 DIAGNOSIS — R7303 Prediabetes: Secondary | ICD-10-CM | POA: Diagnosis not present

## 2020-10-24 LAB — HEMOGLOBIN A1C: Hgb A1c MFr Bld: 6.3 % (ref 4.6–6.5)

## 2020-10-24 LAB — TSH: TSH: 1.7 u[IU]/mL (ref 0.35–4.50)

## 2020-10-24 LAB — T4, FREE: Free T4: 0.75 ng/dL (ref 0.60–1.60)

## 2020-10-24 MED ORDER — OXCARBAZEPINE 150 MG PO TABS: 150.0000 mg | ORAL_TABLET | Freq: Two times a day (BID) | ORAL | 2 refills | Status: DC

## 2020-10-24 NOTE — Progress Notes (Signed)
Chief Complaint  Patient presents with  . Numbness    Both hands/finger tips    Subjective: Patient is a 54 y.o. female here for evaluation of neuropathy.  Patient has a history of both fibromyalgia and neuropathy in her hands.  She was diagnosed by EMG over 5 years ago in [place].  She has not had any imaging since then.  Over the past 2 weeks, she has had worsening tingling and proprioception in her hands.  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  She is not having any weakness that she notices.  No balance issues.  She is currently on Neurontin 300 mg nightly, Cymbalta 60 mg twice daily, tramadol as needed.  Where she is currently working, the neurology team told her that she could be seen as long as she had a referral and EMG ordered.  Past Medical History:  Diagnosis Date  . Anemia    hx of  . Arthritis   . 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 Chicken pox   . Depression    hx of in past situational  . Fibromyalgia   . GERD (gastroesophageal reflux disease)   . Headache    hx of migraines  . History of kidney stones    1996  . Hypertension     Objective: BP 128/84 (BP Location: Left Arm, Patient Position: Sitting, Cuff Size: Large)   Pulse 67   Temp 98.1 F (36.7 C) (Oral)   Ht 5\' 6"  (1.676 m)   Wt 270 lb 2 oz (122.5 kg)   LMP 06/09/2015 (Approximate)   SpO2 96%   BMI 43.60 kg/m  General: Awake, appears stated age Neuro: Sensation intact to pinprick left decreased sensation over the palms bilaterally.  Intact proximal to the retinaculum bilaterally.  Grip strength is adequate throughout.  No cerebellar signs.  Gait is normal. Heart: Brisk capillary refill Lungs:  No accessory muscle use Psych: Age appropriate judgment and insight, normal affect and mood  Assessment and Plan: Other polyneuropathy - Plan: Ambulatory referral to Neurology, NCV with EMG(electromyography), Hemoglobin A1c, OXcarbazepine (TRILEPTAL) 150 MG tablet,  TSH, T4, free  Prediabetes - Plan: Hemoglobin A1c  Check labs.  Referral to neurology placed.  We will order an EMG.  She has difficulty tolerating higher doses of Neurontin.  We will continue all of her serotonin modulating medications and add a low-dose of Trileptal twice daily to see if we can help with her symptoms. Follow-up with regular PCP as originally scheduled. The patient voiced understanding and agreement to the plan.  [place], DO 10/24/20  10:38 AM

## 2020-10-24 NOTE — Patient Instructions (Addendum)
Give [place] 2-3 business days to get the results of your labs back.   If you do not hear anything about your referral in the next 1-2 weeks, call our office and ask for an update.  [email or web address]  Aim to do some physical exertion for 150 minutes per week. This is typically divided into 5 days per week, 30 minutes per day. The activity should be enough to get your heart rate up. Anything is better than nothing if you have time constraints.  Let us know if you need anything.

## 2020-11-03 ENCOUNTER — Other Ambulatory Visit: Payer: Self-pay | Admitting: Cardiovascular Disease

## 2020-11-03 MED FILL — ROSUVASTATIN CALCIUM 20 MG: 20 | 15 days supply | Qty: 15 | Fill #0 | Status: TO

## 2020-11-03 MED FILL — OMEPRAZOLE 20 MG CAP: 20 | 30 days supply | Qty: 30 | Fill #2 | Status: TO

## 2020-11-03 MED FILL — AMLODIPINE BESYLATE 10 MG T: 10 | 30 days supply | Qty: 30 | Fill #5 | Status: TO

## 2020-11-03 MED FILL — DULOXETINE HCL 60 MG CPEP: 60 | 30 days supply | Qty: 60 | Fill #5 | Status: TO

## 2020-11-03 MED FILL — HYDROCHLOROTHIAZIDE 12.5 MG: 12.5 | 30 days supply | Qty: 30 | Fill #5 | Status: TO

## 2020-11-03 MED FILL — MELOXICAM 15 MG TABLET: 15 | 30 days supply | Qty: 30 | Fill #5 | Status: TO

## 2020-11-03 MED FILL — VASCEPA 1 GM CAPSULE: 1 | 30 days supply | Qty: 120 | Fill #5 | Status: TO

## 2020-11-03 MED FILL — GABAPENTIN 100 MG CAPSULE: 100 | 30 days supply | Qty: 90 | Fill #1 | Status: TO

## 2020-11-05 ENCOUNTER — Encounter: Payer: Self-pay | Admitting: Medical

## 2020-11-05 ENCOUNTER — Ambulatory Visit (INDEPENDENT_AMBULATORY_CARE_PROVIDER_SITE_OTHER): Payer: Managed Care, Other (non HMO) | Admitting: Medical

## 2020-11-05 ENCOUNTER — Other Ambulatory Visit: Payer: Self-pay

## 2020-11-05 VITALS — BP 126/84 | HR 65 | Ht 65.0 in | Wt 269.8 lb

## 2020-11-05 DIAGNOSIS — R7303 Prediabetes: Secondary | ICD-10-CM | POA: Diagnosis not present

## 2020-11-05 DIAGNOSIS — E782 Mixed hyperlipidemia: Secondary | ICD-10-CM

## 2020-11-05 DIAGNOSIS — E669 Obesity, unspecified: Secondary | ICD-10-CM

## 2020-11-05 DIAGNOSIS — R29818 Other symptoms and signs involving the nervous system: Secondary | ICD-10-CM

## 2020-11-05 DIAGNOSIS — I10 Essential (primary) hypertension: Secondary | ICD-10-CM

## 2020-11-05 DIAGNOSIS — R400 Somnolence: Secondary | ICD-10-CM

## 2020-11-05 MED ORDER — ICOSAPENT ETHYL 1 G PO CAPS: 2.0000 g | ORAL_CAPSULE | Freq: Two times a day (BID) | ORAL | 3 refills | Status: DC

## 2020-11-05 MED ORDER — ROSUVASTATIN CALCIUM 20 MG PO TABS
20.0000 mg | ORAL_TABLET | Freq: Every day | ORAL | 3 refills | Status: DC
Start: 2020-11-05 — End: 2021-09-14

## 2020-11-05 NOTE — Patient Instructions (Signed)
Medication Instructions:  Your physician recommends that you continue on your current medications as directed. Please refer to the Current Medication list given to you today.  *If you need a refill on your cardiac medications before your next appointment, please call your pharmacy*  Lab Work: NONE ordered at this time of appointment   If you have labs (blood work) drawn today and your tests are completely normal, you will receive your results only by:  MyChart Message (if you have MyChart) OR  A paper copy in the mail If you have any lab test that is abnormal or we need to change your treatment, we will call you to review the results.  Testing/Procedures: Your physician has recommended that you have a sleep study. This test records several body functions during sleep, including: brain activity, eye movement, oxygen and carbon dioxide blood levels, heart rate and rhythm, breathing rate and rhythm, the flow of air through your mouth and nose, snoring, body muscle movements, and chest and belly movement.  Follow-Up: At [facility], you and your health needs are our priority.  As part of our continuing mission to provide you with exceptional heart care, we have created designated Provider Care Teams.  These Care Teams include your primary Cardiologist (physician) and Advanced Practice Providers (APPs -  Physician Assistants and Nurse Practitioners) who all work together to provide you with the care you need, when you need it.   Your next appointment:   12 month(s)  The format for your next appointment:   In Person  Provider:   Nicki Guadalajara, MD  Other Instructions

## 2020-11-05 NOTE — Progress Notes (Signed)
Cardiology Office Note   Date:  11/05/2020   ID:  Dawn Reynolds, DOB 1966-02-26, MRN [id number]  PCP:  Pearline Cables, MD  Cardiologist:  Nicki Guadalajara, MD EP: None  Chief Complaint  Patient presents with  . Follow-up    HTN, HLD      History of Present Illness: Dawn Reynolds is a 54 y.o. female with a PMH of HTN, mixed HLD, pre-DM type 2, OA, and fibromyalgia, who presents for routine follow-up.  She was last evaluated by cardiology at an ouptaitent visit with Dr. Tresa Endo 05/2019, at which time she was doing fine from a cardiac standpoint without recent chest pain. She was recommended to complete a previously recommended echocardiogram and coronary CTA to further evaluate chest pain. Coronary CTA 06/2019 showed a calcium score of 0 and no evidence of CAD. Echocardiogram 05/2019 showed EF 60-65%, mild LVH, G1DD, and no significant valvular abnormalities. She was recommended to follow-up in 3 months, though has not been seen since that time.   She presents today for routine follow-up. She has been doing okay from a cardiac standpoint since her last visit, though has been struggling with weight over the past year. 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 This has resulted in little time for exercise and significant weight gain over the past year. Thankfully she has had no issues with chest pain, SOB, DOE, palpitations, dizziness, lightheadedness, or syncope. She has chronic LE edema which is stable on home HCTZ. Additionally she has daytime somnolence, snoring, and large neck girth suspicious for OSA.    Past Medical History:  Diagnosis Date  . Anemia    hx of  . Arthritis   . 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 Chicken pox   . Depression    hx of in past situational  . Fibromyalgia   . GERD (gastroesophageal reflux disease)   . Headache    hx of  migraines  . History of kidney stones    1996  . Hypertension     Past Surgical History:  Procedure Laterality Date  . ABDOMINAL HYSTERECTOMY    . CHOLECYSTECTOMY     03-31-18 Derrell Lolling  . CHOLECYSTECTOMY N/A 03/31/2018   Procedure: LAPAROSCOPIC CHOLECYSTECTOMY;  Surgeon: Axel Filler, MD;  Location: [facility];  Service: General;  Laterality: N/A;  . COLONOSCOPY    . endoplantar fasciotomy    . GALLBLADDER SURGERY  03/31/2018  . KNEE ARTHROSCOPY     left knee 2012  . LAPAROSCOPIC CHOLECYSTECTOMY    . REPLACEMENT TOTAL KNEE BILATERAL Bilateral 09/27/2018  . TONSILLECTOMY     1999  . TOTAL KNEE ARTHROPLASTY Bilateral 09/27/2018   Procedure: TOTAL KNEE BILATERAL;  Surgeon: Gean Birchwood, MD;  Location: [facility];  Service: Orthopedics;  Laterality: Bilateral;  . UPPER GASTROINTESTINAL ENDOSCOPY       Current Outpatient Medications  Medication Sig Dispense Refill  . ALPRAZolam (XANAX) 0.25 MG tablet Take 1 tablet (0.25 mg total) by mouth 2 (two) times daily as needed for anxiety. 30 tablet 1  . amLODipine (NORVASC) 10 MG tablet Take 1 tablet (10 mg total) by mouth daily. 90 tablet 1  . aspirin EC 81 MG tablet Take 1 tablet (81 mg total) by mouth 2 (two) times daily. (Patient taking differently: Take 81 mg by mouth 2 (two) times daily. Pt takes once daily at night) 60 tablet  0  . B Complex Vitamins (B COMPLEX 1 PO) Take 1 tablet by mouth daily.     . Biotin 5000 MCG CAPS Take 5,000 mcg by mouth daily.    . Calcium Carb-Cholecalciferol 600-800 MG-UNIT TABS Take 1 tablet by mouth daily.    . cholecalciferol (VITAMIN D3) 25 MCG (1000 UT) tablet Take 1,000 Units by mouth daily. Pt takes 5000 units daily    . cyclobenzaprine (FLEXERIL) 10 MG tablet Take 1 tablet (10 mg total) by mouth at bedtime. PRN 30 tablet 5  . DULoxetine (CYMBALTA) 60 MG capsule TAKE 1 CAPSULE BY MOUTH 2 TIMES DAILY. 180 capsule 3  . gabapentin (NEURONTIN) 100 MG capsule TAKE 1 CAPSULE BY MOUTH AT BEDTIME. MAY INCREASE TO  300 MG AS NEEDED 90 capsule 3  . hydrochlorothiazide (MICROZIDE) 12.5 MG capsule Take 1 capsule (12.5 mg total) by mouth daily. 90 capsule 1  . meloxicam (MOBIC) 15 MG tablet TAKE 1 TABLET (15 MG TOTAL) BY MOUTH DAILY. 90 tablet 1  . omeprazole (PRILOSEC) 20 MG capsule Take 1 capsule (20 mg total) by mouth daily. 90 capsule 3  . OXcarbazepine (TRILEPTAL) 150 MG tablet Take 1 tablet (150 mg total) by mouth 2 (two) times daily. 60 tablet 2  . traMADol (ULTRAM) 50 MG tablet Take 1 tablet (50 mg total) by mouth every 12 (twelve) hours as needed. 60 tablet 1  . vitamin C (ASCORBIC ACID) 500 MG tablet Take 500 mg by mouth daily.    Marland Kitchen icosapent Ethyl (VASCEPA) 1 g capsule Take 2 capsules (2 g total) by mouth 2 (two) times daily. 360 capsule 3  . nitrofurantoin, macrocrystal-monohydrate, (MACROBID) 100 MG capsule Take 100 mg by mouth 2 (two) times daily. Pt takes as needed    . rosuvastatin (CRESTOR) 20 MG tablet Take 1 tablet (20 mg total) by mouth daily. 90 tablet 3  . TURMERIC PO Take 1 capsule by mouth daily. (Patient not taking: Reported on 11/05/2020)     No current facility-administered medications for this visit.    Allergies:   Aspartame and Demerol [meperidine]    Social History:  The patient  reports that she has never smoked. She has never used smokeless tobacco. She reports current alcohol use. She reports that she does not use drugs.   Family History:  The patient's family history includes Breast cancer in her mother; Colon cancer in her paternal uncle; Diabetes in her father; Heart disease in her father and mother; Irritable bowel syndrome in her father; Kidney disease in her father; Liver cancer in her paternal uncle; Mitral valve prolapse in her mother; Pancreatic cancer in her maternal aunt; Prostate cancer in her father.    ROS:  Please see the history of present illness.   Otherwise, review of systems are positive for none.   All other systems are reviewed and negative.     PHYSICAL EXAM: VS:  BP 126/84 (BP Location: Left Arm, Patient Position: Sitting)   Pulse 65   Ht 5\' 5"  (1.651 m)   Wt 269 lb 12.8 oz (122.4 kg)   LMP 06/09/2015 (Approximate)   SpO2 96%   BMI 44.90 kg/m  , BMI Body mass index is 44.9 kg/m. GEN: Well nourished, well developed, in no acute distress HEENT: sclera anicteric Neck: no JVD, carotid bruits, or masses Cardiac: RRR; no murmurs, rubs, or gallops, Trace LE edema  Respiratory: clear to auscultation bilaterally, normal work of breathing GI: soft, obese, nontender, nondistended, + BS MS: no deformity or atrophy Skin:  warm and dry, no rash Neuro:  Strength and sensation are intact Psych: euthymic mood, full affect   EKG:  EKG is ordered today. The ekg ordered today demonstrates sinus rhythm with rate 65bpm, isolated TWI in V2, no STE/D; no change from previous   Recent Labs: 06/02/2020: ALT 38; Hemoglobin 6.1 06/12/2020: BUN 15; Creatinine, Ser 0.86; Potassium 3.8; Sodium 140 10/24/2020: TSH 1.70    Lipid Panel    Component Value Date/Time   CHOL 139 06/02/2020 0000   CHOL 103 05/21/2019 1116   TRIG 229 (A) 06/02/2020 0000   HDL 56 06/02/2020 0000   HDL 49 05/21/2019 1116   CHOLHDL 2.1 05/21/2019 1116   CHOLHDL 4 12/28/2018 1302   VLDL 51.2 (H) 12/28/2018 1302   LDLCALC 36 06/02/2020 0000   LDLCALC 33 05/21/2019 1116   LDLDIRECT 119.0 12/28/2018 1302      Wt Readings from Last 3 Encounters:  11/05/20 269 lb 12.8 oz (122.4 kg)  10/24/20 270 lb 2 oz (122.5 kg)  06/12/20 260 lb (117.9 kg)      Other studies Reviewed: Additional studies/ records that were reviewed today include:   Coronary CTA 06/2019: IMPRESSION: 1. Coronary artery calcium score 0 Agatston units. This suggests low risk for future cardiac events.  2.  No significant coronary disease noted.  Echocardiogram 05/2019: 1. The left ventricle has normal systolic function with an ejection  fraction of 60-65%. The cavity size was normal.  There is mildly increased  left ventricular wall thickness. Left ventricular diastolic Doppler  parameters are consistent with impaired  relaxation.  2. The right ventricle has normal systolic function. The cavity was  normal.  3. The mitral valve is abnormal. Mild thickening of the mitral valve  leaflet.  4. The aortic valve is tricuspid. No stenosis of the aortic valve.  5. The aorta is normal in size and structure.  6. Normal LV systolic function; mild diastolic dysfunction; mild LVH.    ASSESSMENT AND PLAN:  1. HTN: BP 126/84 today - Continue amlodipine and HCTZ  2. Mixed hyperlipidemia: LDL 36 and triglycerides 229 05/2020 - Continue vascepa and rosuvastatin  3. Pre-DM type 2: A1C 6.3 10/24/20 - Continue dietary/lifestyle modifications to prevent progression to DM type 2  4. Suspected OSA: patient reports daytime somnolence and snoring, as well as weight gain over the past year.  - Will check a home sleep study to evaluate for OSA  5. Obesity: patient has had weight gain over the past year. She has been working as a Multimedia programmer and relying on food from [employer] and [employer].  - Will send a referral to the healthy weight and wellness center to assist with weight loss strategies    Current medicines are reviewed at length with the patient today.  The patient does not have concerns regarding medicines.  The following changes have been made:  As above  Labs/ tests ordered today include:   Orders Placed This Encounter  Procedures  . EKG 12-Lead  . Home sleep test     Disposition:   FU with Dr. Claiborne Billings in 1 year  Signed, Abigail Butts, PA-C  11/05/2020 4:56 PM

## 2020-11-06 ENCOUNTER — Other Ambulatory Visit: Payer: Self-pay | Admitting: *Deleted

## 2020-11-06 DIAGNOSIS — M797 Fibromyalgia: Secondary | ICD-10-CM

## 2020-11-06 MED ORDER — DULOXETINE HCL 60 MG PO CPEP: ORAL_CAPSULE | ORAL | 3 refills | Status: DC

## 2020-11-06 MED ORDER — HYDROCHLOROTHIAZIDE 12.5 MG PO CAPS
12.5000 mg | ORAL_CAPSULE | Freq: Every day | ORAL | 1 refills | Status: DC
Start: 2020-11-06 — End: 2021-05-18

## 2020-11-06 MED ORDER — AMLODIPINE BESYLATE 10 MG PO TABS
10.0000 mg | ORAL_TABLET | Freq: Every day | ORAL | 1 refills | Status: DC
Start: 2020-11-06 — End: 2021-05-18

## 2020-11-06 MED ORDER — MELOXICAM 15 MG PO TABS
ORAL_TABLET | ORAL | 1 refills | Status: DC
Start: 2020-11-06 — End: 2021-05-18

## 2020-11-06 NOTE — Addendum Note (Signed)
Addended by: Dorris Fetch on: 11/06/2020 01:39 PM   Modules accepted: Orders

## 2020-11-10 ENCOUNTER — Telehealth: Payer: Self-pay | Admitting: Medical

## 2020-11-10 NOTE — Telephone Encounter (Signed)
No PA required for HST.  Waiting on sleep lab to call back to schedule. 

## 2020-11-11 ENCOUNTER — Other Ambulatory Visit: Payer: Self-pay | Admitting: Family Medicine

## 2020-11-11 DIAGNOSIS — G6289 Other specified polyneuropathies: Secondary | ICD-10-CM

## 2020-11-11 NOTE — Telephone Encounter (Signed)
Called patient and told her that she is scheduled for January 21 at 1 pm, to expect the info packet and gave her the sleep lab number

## 2020-11-12 ENCOUNTER — Other Ambulatory Visit: Payer: Self-pay | Admitting: Family Medicine

## 2020-11-12 DIAGNOSIS — G6289 Other specified polyneuropathies: Secondary | ICD-10-CM

## 2020-11-12 NOTE — Progress Notes (Signed)
.  ortho

## 2020-11-18 ENCOUNTER — Encounter: Payer: Self-pay | Admitting: Neurology

## 2020-11-20 ENCOUNTER — Other Ambulatory Visit: Payer: Self-pay

## 2020-11-20 DIAGNOSIS — R202 Paresthesia of skin: Secondary | ICD-10-CM

## 2020-11-28 ENCOUNTER — Encounter (HOSPITAL_BASED_OUTPATIENT_CLINIC_OR_DEPARTMENT_OTHER): Payer: Managed Care, Other (non HMO)

## 2020-12-16 ENCOUNTER — Other Ambulatory Visit: Payer: Self-pay | Admitting: Family Medicine

## 2020-12-16 DIAGNOSIS — G6289 Other specified polyneuropathies: Secondary | ICD-10-CM

## 2020-12-30 ENCOUNTER — Ambulatory Visit: Payer: Managed Care, Other (non HMO) | Admitting: Neurology

## 2020-12-30 ENCOUNTER — Encounter: Payer: Self-pay | Admitting: Family Medicine

## 2020-12-30 ENCOUNTER — Ambulatory Visit (INDEPENDENT_AMBULATORY_CARE_PROVIDER_SITE_OTHER): Payer: Managed Care, Other (non HMO) | Admitting: Neurology

## 2020-12-30 ENCOUNTER — Other Ambulatory Visit: Payer: Self-pay

## 2020-12-30 DIAGNOSIS — G5603 Carpal tunnel syndrome, bilateral upper limbs: Secondary | ICD-10-CM

## 2020-12-30 DIAGNOSIS — R202 Paresthesia of skin: Secondary | ICD-10-CM | POA: Diagnosis not present

## 2020-12-30 DIAGNOSIS — G5600 Carpal tunnel syndrome, unspecified upper limb: Secondary | ICD-10-CM

## 2020-12-30 NOTE — Procedures (Signed)
[facility]  [address]  [place], [place] [address] Tel: [phone] Fax:  [phone] Test Date:  12/30/2020  Patient: Dawn Reynolds DOB: 02-02-1966 Physician: Narda Amber, DO  Sex: Female Height: 5\' 5"  Ref Phys: Riki Sheer, DO  ID#: [id number]   Technician:    Patient Complaints: This is a 55 year old female referred for evaluation of bilateral hand paresthesias.  NCV & EMG Findings: Extensive electrodiagnostic testing of the right upper extremity and additional studies of the left shows:  1. Bilateral median sensory responses show prolonged peak latency (L5.6, R5.0 ms) and reduced amplitude (L4.9, R8.2 V).  Bilateral ulnar and radial sensory responses are within normal limits. 2. Bilateral median motor responses show prolonged latency (L6.1, R4.5 ms).  Bilateral ulnar motor responses are within normal limits.  3. There is no evidence of active or chronic motor axonal loss changes affecting any of the tested muscles.  Motor unit configuration and recruitment pattern is within normal limits.    Impression: 1. Bilateral median neuropathy at or distal to the wrist, consistent with a clinical diagnosis of carpal tunnel syndrome.  Overall, these findings are severe in degree electrically and worse on the left. 2. There is no evidence of a sensorimotor polyneuropathy affecting the upper extremities.   ___________________________ Narda Amber, DO    Nerve Conduction Studies Anti Sensory Summary Table   Stim Site NR Peak (ms) Norm Peak (ms) P-T Amp (V) Norm P-T Amp  Left Median Anti Sensory (2nd Digit)  33C  Wrist    5.6 <3.6 4.9 >15  Right Median Anti Sensory (2nd Digit)  33C  Wrist    5.0 <3.6 8.2 >15  Left Radial Anti Sensory (Base 1st Digit)  33C  Wrist    2.5 <2.7 17.6 >14  Right Radial Anti Sensory (Base 1st Digit)  33C  Wrist    2.3 <2.7 16.2 >14  Left Ulnar Anti Sensory (5th Digit)  33C  Wrist    2.7 <3.1 18.4 >10  Right Ulnar  Anti Sensory (5th Digit)  33C  Wrist    2.6 <3.1 18.0 >10   Motor Summary Table   Stim Site NR Onset (ms) Norm Onset (ms) O-P Amp (mV) Norm O-P Amp Site1 Site2 Delta-0 (ms) Dist (cm) Vel (m/s) Norm Vel (m/s)  Left Median Motor (Abd Poll Brev)  33C  Wrist    6.1 <4.0 7.8 >6 Elbow Wrist 5.1 26.0 51 >50  Elbow    11.2  7.2         Right Median Motor (Abd Poll Brev)  33C  Wrist    4.5 <4.0 10.9 >6 Elbow Wrist 4.7 26.0 55 >50  Elbow    9.2  10.7         Left Ulnar Motor (Abd Dig Minimi)  33C  Wrist    2.2 <3.1 7.2 >7 B Elbow Wrist 3.5 22.0 63 >50  B Elbow    5.7  7.0  A Elbow B Elbow 1.7 10.0 59 >50  A Elbow    7.4  7.1         Right Ulnar Motor (Abd Dig Minimi)  33C  Wrist    2.0 <3.1 7.4 >7 B Elbow Wrist 3.6 21.0 58 >50  B Elbow    5.6  6.4  A Elbow B Elbow 1.6 10.0 62 >50  A Elbow    7.2  5.9          EMG   Side Muscle Ins Act Fibs  Psw Fasc Number Recrt Dur Dur. Amp Amp. Poly Poly. Comment  Right 1stDorInt Nml Nml Nml Nml Nml Nml Nml Nml Nml Nml Nml Nml N/A  Right Abd Poll Brev Nml Nml Nml Nml Nml Nml Nml Nml Nml Nml Nml Nml N/A  Right PronatorTeres Nml Nml Nml Nml Nml Nml Nml Nml Nml Nml Nml Nml N/A  Right Biceps Nml Nml Nml Nml Nml Nml Nml Nml Nml Nml Nml Nml N/A  Right Triceps Nml Nml Nml Nml Nml Nml Nml Nml Nml Nml Nml Nml N/A  Left 1stDorInt Nml Nml Nml Nml Nml Nml Nml Nml Nml Nml Nml Nml N/A  Left Abd Poll Brev Nml Nml Nml Nml Nml Nml Nml Nml Nml Nml Nml Nml N/A  Left PronatorTeres Nml Nml Nml Nml Nml Nml Nml Nml Nml Nml Nml Nml N/A  Left Biceps Nml Nml Nml Nml Nml Nml Nml Nml Nml Nml Nml Nml N/A  Left Triceps Nml Nml Nml Nml Nml Nml Nml Nml Nml Nml Nml Nml N/A      Waveforms:

## 2020-12-31 NOTE — Addendum Note (Signed)
Addended by: Lamar Blinks C on: 12/31/2020 09:09 AM   Modules accepted: Orders

## 2021-01-06 ENCOUNTER — Telehealth: Payer: Self-pay

## 2021-01-06 NOTE — Telephone Encounter (Signed)
**Note De-Identified Deandre Stansel Obfuscation** Vascepa PA done through covermymeds. Key: [id number]

## 2021-01-06 NOTE — Telephone Encounter (Signed)
Prior authorization for Vascepa 2g BID received from patient's insurance company via covermymeds.  CMM Key: [id number] Message sent to PA department for processing.

## 2021-01-13 ENCOUNTER — Encounter: Payer: Self-pay | Admitting: Family Medicine

## 2021-01-13 NOTE — Telephone Encounter (Signed)
Message from covermymeds: Dawn Reynolds (Key: [id number]) Vascepa 1GM capsules  Form: [employer] Electronic PA Form [phone] NCPDP) Prior authorization for Vascepa has been approved.  Message from plan: Case Id: [id number]; Status: Approved; Review Type: Prior Auth Coverage Start Date:01/06/2021;Coverage End Date:01/06/2022  I have notified [facility] of this approval.

## 2021-01-30 ENCOUNTER — Encounter: Payer: Self-pay | Admitting: Family Medicine

## 2021-02-04 ENCOUNTER — Encounter: Payer: Self-pay | Admitting: Family Medicine

## 2021-03-05 ENCOUNTER — Other Ambulatory Visit: Payer: Self-pay | Admitting: Family Medicine

## 2021-03-13 ENCOUNTER — Encounter: Payer: Self-pay | Admitting: Family Medicine

## 2021-03-13 DIAGNOSIS — K219 Gastro-esophageal reflux disease without esophagitis: Secondary | ICD-10-CM

## 2021-03-15 NOTE — Addendum Note (Signed)
Addended by: Lamar Blinks C on: 03/15/2021 06:55 AM   Modules accepted: Orders

## 2021-03-16 ENCOUNTER — Encounter: Payer: Self-pay | Admitting: Family Medicine

## 2021-03-21 NOTE — Progress Notes (Deleted)
[place] at [facility] [address] [place], [place] [address] [phone]  Date:  03/25/2021   Name:  Dawn Reynolds   DOB:  May 30, 1966   MRN:  [id number]  PCP:  Darreld Mclean, MD    Chief Complaint: No chief complaint on file.   History of Present Illness:  Dawn Reynolds is a 55 y.o. very pleasant female patient who presents with the following:  Last visit with myself in August History of obesity, dyslipidemia, fibromyalgia, hypertension  Following up today from recent hospital admission/ er visit at [facility] on March 3.  She sent [place] the following message:    I just got out of the hospital Wednesday night after being admitted with chest pain/pressure. 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 Treating medically. However, I still feel like crap.  I'm now assuming all my symptoms are GI related. I can't sleep laying down, have terrible reflux, have diarrhea daily, have poor appetite and occasional nausea. The discharging physician told me to take Omeprazole BID instead of once daily.  I'm doing that. And I started on Metformin 500mg  once daily for one week then BID.   I placed a referral to [facility] gastroenterology in [place] for  Patient Active Problem List   Diagnosis Date Noted  . S/P TKR (total knee replacement), bilateral 09/27/2018  . Bilateral primary osteoarthritis of knee 09/26/2018  . Obesity (BMI 35.0-39.9 without comorbidity) 04/06/2018  . High triglycerides 04/06/2018  . Osteoarthritis of left knee 12/21/2017  . Chest pain 07/09/2017  . Fibromyalgia 07/09/2017  . Hypertension 07/09/2017  . Abnormal weight gain 01/31/2014  . Empty sella syndrome ([place]) 01/31/2014  . Other abnormal glucose 01/31/2014  . Neuropathy, peripheral 12/25/2013    Past Medical History:  Diagnosis Date  . Anemia    hx of  . Arthritis   . 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 Chicken pox    . Depression    hx of in past situational  . Fibromyalgia   . GERD (gastroesophageal reflux disease)   . Headache    hx of migraines  . History of kidney stones    1996  . Hypertension     Past Surgical History:  Procedure Laterality Date  . ABDOMINAL HYSTERECTOMY    . CHOLECYSTECTOMY     03-31-18 [name]  . CHOLECYSTECTOMY N/A 03/31/2018   Procedure: LAPAROSCOPIC CHOLECYSTECTOMY;  Surgeon: Ralene Ok, MD;  Location: [facility];  Service: General;  Laterality: N/A;  . COLONOSCOPY    . endoplantar fasciotomy    . GALLBLADDER SURGERY  03/31/2018  . KNEE ARTHROSCOPY     left knee 2012  . LAPAROSCOPIC CHOLECYSTECTOMY    . REPLACEMENT TOTAL KNEE BILATERAL Bilateral 09/27/2018  . TONSILLECTOMY     1999  . TOTAL KNEE ARTHROPLASTY Bilateral 09/27/2018   Procedure: TOTAL KNEE BILATERAL;  Surgeon: Frederik Pear, MD;  Location: [facility];  Service: Orthopedics;  Laterality: Bilateral;  . UPPER GASTROINTESTINAL ENDOSCOPY      Social History   Tobacco Use  . Smoking status: Never Smoker  . Smokeless tobacco: Never Used  Vaping Use  . Vaping Use: Never used  Substance Use Topics  . Alcohol use: Yes    Comment: occ  . Drug use: No    Family History  Problem Relation Age of Onset  . Heart disease Father   . Prostate cancer Father   .  Diabetes Father   . Irritable bowel syndrome Father   . Kidney disease Father   . Mitral valve prolapse Mother   . Heart disease Mother   . Breast cancer Mother   . Pancreatic cancer Maternal Aunt   . Liver cancer Paternal Uncle   . Colon cancer Paternal Uncle   . Esophageal cancer Neg Hx   . Stomach cancer Neg Hx   . Rectal cancer Neg Hx     Allergies  Allergen Reactions  . Aspartame Other (See Comments)    Headaches.  . Demerol [Meperidine] Nausea And Vomiting    Medication list has been reviewed and updated.  Current Outpatient Medications on File Prior to Visit  Medication Sig Dispense Refill  . ALPRAZolam (XANAX) 0.25 MG  tablet Take 1 tablet (0.25 mg total) by mouth 2 (two) times daily as needed for anxiety. 30 tablet 1  . amLODipine (NORVASC) 10 MG tablet Take 1 tablet (10 mg total) by mouth daily. 90 tablet 1  . aspirin EC 81 MG tablet Take 1 tablet (81 mg total) by mouth 2 (two) times daily. (Patient taking differently: Take 81 mg by mouth 2 (two) times daily. Pt takes once daily at night) 60 tablet 0  . B Complex Vitamins (B COMPLEX 1 PO) Take 1 tablet by mouth daily.     . Biotin 5000 MCG CAPS Take 5,000 mcg by mouth daily.    . Calcium Carb-Cholecalciferol 600-800 MG-UNIT TABS Take 1 tablet by mouth daily.    . cholecalciferol (VITAMIN D3) 25 MCG (1000 UT) tablet Take 1,000 Units by mouth daily. Pt takes 5000 units daily    . cyclobenzaprine (FLEXERIL) 10 MG tablet Take 1 tablet (10 mg total) by mouth at bedtime. PRN 30 tablet 5  . DULoxetine (CYMBALTA) 60 MG capsule TAKE 1 CAPSULE BY MOUTH 2 TIMES DAILY. 180 capsule 3  . gabapentin (NEURONTIN) 100 MG capsule TAKE 1 CAPSULE BY MOUTH EVERY DAY AT BEDTIME MAY INCREASE TO 300MG  AS NEEDED 90 capsule 0  . hydrochlorothiazide (MICROZIDE) 12.5 MG capsule Take 1 capsule (12.5 mg total) by mouth daily. 90 capsule 1  . icosapent Ethyl (VASCEPA) 1 g capsule Take 2 capsules (2 g total) by mouth 2 (two) times daily. 360 capsule 3  . meloxicam (MOBIC) 15 MG tablet TAKE 1 TABLET (15 MG TOTAL) BY MOUTH DAILY. 90 tablet 1  . nitrofurantoin, macrocrystal-monohydrate, (MACROBID) 100 MG capsule Take 100 mg by mouth 2 (two) times daily. Pt takes as needed    . omeprazole (PRILOSEC) 20 MG capsule Take 1 capsule (20 mg total) by mouth daily. 90 capsule 3  . OXcarbazepine (TRILEPTAL) 150 MG tablet TAKE 1 TABLET BY MOUTH TWICE A DAY 60 tablet 6  . rosuvastatin (CRESTOR) 20 MG tablet Take 1 tablet (20 mg total) by mouth daily. 90 tablet 3  . traMADol (ULTRAM) 50 MG tablet Take 1 tablet (50 mg total) by mouth every 12 (twelve) hours as needed. 60 tablet 1  . TURMERIC PO Take 1 capsule  by mouth daily. (Patient not taking: Reported on 11/05/2020)    . vitamin C (ASCORBIC ACID) 500 MG tablet Take 500 mg by mouth daily.     No current facility-administered medications on file prior to visit.    Review of Systems:  As per HPI- otherwise negative.   Physical Examination: There were no vitals filed for this visit. There were no vitals filed for this visit. There is no height or weight on file to calculate BMI. Ideal  Body Weight:    GEN: no acute distress. HEENT: Atraumatic, Normocephalic.  Ears and Nose: No external deformity. CV: RRR, No M/G/R. No JVD. No thrill. No extra heart sounds. PULM: CTA B, no wheezes, crackles, rhonchi. No retractions. No resp. distress. No accessory muscle use. ABD: S, NT, ND, +BS. No rebound. No HSM. EXTR: No c/c/e PSYCH: Normally interactive. Conversant.    Assessment and Plan: ***  This visit occurred during the SARS-CoV-2 public health emergency.  Safety protocols were in place, including screening questions prior to the visit, additional usage of staff PPE, and extensive cleaning of exam room while observing appropriate contact time as indicated for disinfecting solutions.     Signed Lamar Blinks, MD

## 2021-03-23 ENCOUNTER — Ambulatory Visit: Payer: Managed Care, Other (non HMO) | Admitting: Family Medicine

## 2021-03-25 ENCOUNTER — Inpatient Hospital Stay: Payer: Managed Care, Other (non HMO) | Admitting: Family Medicine

## 2021-03-31 ENCOUNTER — Ambulatory Visit
Admission: RE | Admit: 2021-03-31 | Discharge: 2021-03-31 | Disposition: A | Discharge status: Home / Self Care | Payer: Managed Care, Other (non HMO) | Source: Ambulatory Visit | Attending: Family Medicine | Admitting: Family Medicine

## 2021-03-31 ENCOUNTER — Other Ambulatory Visit: Payer: Self-pay

## 2021-03-31 DIAGNOSIS — R928 Other abnormal and inconclusive findings on diagnostic imaging of breast: Secondary | ICD-10-CM

## 2021-04-04 ENCOUNTER — Other Ambulatory Visit: Payer: Self-pay | Admitting: Family Medicine

## 2021-04-08 ENCOUNTER — Ambulatory Visit: Payer: Managed Care, Other (non HMO) | Admitting: Physician Assistant

## 2021-04-13 ENCOUNTER — Ambulatory Visit (HOSPITAL_BASED_OUTPATIENT_CLINIC_OR_DEPARTMENT_OTHER): Payer: Managed Care, Other (non HMO) | Attending: Medical | Admitting: Cardiovascular Disease

## 2021-04-13 ENCOUNTER — Other Ambulatory Visit: Payer: Self-pay

## 2021-04-13 ENCOUNTER — Other Ambulatory Visit: Payer: Self-pay | Admitting: Family Medicine

## 2021-04-13 DIAGNOSIS — G4733 Obstructive sleep apnea (adult) (pediatric): Secondary | ICD-10-CM

## 2021-04-13 DIAGNOSIS — R400 Somnolence: Secondary | ICD-10-CM

## 2021-04-13 DIAGNOSIS — R29818 Other symptoms and signs involving the nervous system: Secondary | ICD-10-CM | POA: Diagnosis not present

## 2021-04-15 ENCOUNTER — Encounter: Payer: Self-pay | Admitting: Family Medicine

## 2021-04-16 MED ORDER — GABAPENTIN 300 MG PO CAPS
300.0000 mg | ORAL_CAPSULE | Freq: Every day | ORAL | 2 refills | Status: DC
Start: 2021-04-16 — End: 2022-01-14

## 2021-04-16 NOTE — Addendum Note (Signed)
Addended by: Lamar Blinks C on: 04/16/2021 06:54 AM   Modules accepted: Orders

## 2021-04-17 ENCOUNTER — Ambulatory Visit: Payer: Managed Care, Other (non HMO) | Admitting: Physician Assistant

## 2021-04-21 ENCOUNTER — Encounter: Payer: Self-pay | Admitting: Family Medicine

## 2021-05-01 ENCOUNTER — Encounter (HOSPITAL_BASED_OUTPATIENT_CLINIC_OR_DEPARTMENT_OTHER): Payer: Self-pay | Admitting: Cardiovascular Disease

## 2021-05-01 NOTE — Procedures (Signed)
     Patient Name: Dawn Reynolds, Dawn Reynolds Date: 04/13/2021 Gender: Female D.O.B: 08-05-1966 Age (years): 54 Referring Provider: Roby Lofts PA-C Height (inches): 32 Interpreting Physician: Shelva Majestic MD, ABSM Weight (lbs): 257 RPSGT: Gerhard Perches BMI: 43 MRN: [id number] Neck Size: <br>  CLINICAL INFORMATION Sleep Study Type: HST  Indication for sleep study: snoring, thick neck;   Epworth Sleepiness Score: 3  SLEEP STUDY TECHNIQUE A multi-channel overnight portable sleep study was performed. The channels recorded were: nasal airflow, thoracic respiratory movement, and oxygen saturation with a pulse oximetry. Snoring was also monitored.  MEDICATIONS Patient self administered medications include: N/A.  SLEEP ARCHITECTURE Patient was studied for 372.7 minutes. The sleep efficiency was 100.0 % and the patient was supine for 20%. The arousal index was 0.0 per hour.  RESPIRATORY PARAMETERS The overall AHI was 7.4 per hour, with a central apnea index of 0 per hour. Supine sleep AHI 8.9/h.   The oxygen nadir was 86% during sleep. Time spent , 89% was 26.6 minutes.  CARDIAC DATA Mean heart rate during sleep was 66.8 bpm.  IMPRESSIONS - Mild obstructive sleep apnea occurred during this study (AHI 7.4/h). - Mild  oxygen desaturation to a nadir of 86%. - Patient snored 20.8% during the sleep.  DIAGNOSIS - Obstructive Sleep Apnea (G47.33) - Nocturnal Hypoxemia (G47.36)  RECOMMENDATIONS - Therapeutic CPAP titration to determine optimal pressure required to alleviate sleep disordered breathing.  If unable to obtain an in-lab titration, initiate Auto-PAP with EPR of 3 at 6 - 16 cm of water. - Effort should be made to optimize nasal and oropharyngeal patency. - Avoid alcohol, sedatives and other CNS depressants that may worsen sleep apnea and disrupt normal sleep architecture. - Sleep hygiene should be reviewed to assess factors that may improve sleep quality. - Weight  management and regular exercise should be initiated or continued. - Recommend a download and sleep clinic evaluation after one month of therapy.   [Electronically signed] 05/01/2021 05:13 PM  Shelva Majestic MD, [facility], [place], American Board of Sleep Medicine   NPI: [id number] [place] PH: [phone]   FX: [phone] [place]

## 2021-05-16 ENCOUNTER — Other Ambulatory Visit: Payer: Self-pay | Admitting: Family Medicine

## 2021-05-17 ENCOUNTER — Other Ambulatory Visit: Payer: Self-pay | Admitting: Family Medicine

## 2021-05-18 ENCOUNTER — Encounter: Payer: Self-pay | Admitting: Family Medicine

## 2021-05-18 MED ORDER — NITROFURANTOIN MONOHYD MACRO 100 MG PO CAPS
100.0000 mg | ORAL_CAPSULE | Freq: Two times a day (BID) | ORAL | 0 refills | Status: DC
Start: 2021-05-18 — End: 2021-09-24

## 2021-05-19 ENCOUNTER — Telehealth: Payer: Self-pay | Admitting: *Deleted

## 2021-05-19 NOTE — Telephone Encounter (Signed)
Patient notified of sleep study results and recommendations. She voiced understanding of what has been told to her and has no questions or concerns at this time.

## 2021-05-19 NOTE — Telephone Encounter (Signed)
-----   Message from Troy Sine, MD sent at 05/01/2021  5:21 PM EDT ----- Mariann Laster, please notify pt of results;  set up for CPAP titration study, if unable then Auto-PAP

## 2021-05-20 NOTE — Progress Notes (Signed)
[place] at [facility] [address] [place], [place] [address] [phone]  Date:  05/21/2021   Name:  Dawn Reynolds   DOB:  05-14-1966   MRN:  [id number]  PCP:  Darreld Mclean, MD    Chief Complaint: Urinary Tract Infection and ER Follow up (Concerns/ questions: none in particular)   History of Present Illness:  Dawn Reynolds is a 55 y.o. very pleasant female patient who presents with the following:  Following up today for concern of  Last seen by myself in August for CPE History of obesity, HTN, high triglycerides, prediabetes   Lab Results  Component Value Date   HGBA1C 6.3 10/24/2020   She was seen at the ER at [facility] last week and did have an e coli UTI- this was not treated until 7/11 when she contacted me with concern about her culture results.  She is not having any UTI sx   She went into the ER due to GERD sx She actually saw GI while she was in the ER and they did an upper GI  She is bothered by excessive belching anytime she tries to take anything by mouth  They found a hiatal hernia but otherwise ok Her GI recommended that she go on omeprazole 40 BID - this was not covered by her insurance however She is taking 40 in the am now  She is seeing her GI doc this afternoon- Dr Henrene Pastor in [place]  He also does some sort of surgery that might help with weight loss  She had to stop using metformin due to GI side effects   Pt is frustrated that she cannot lose weight She has tried to cut way down on her oral intake and it sounds like she is eating a pretty restrictive diet  She is working hard and not losing weight  Wt Readings from Last 3 Encounters:  05/21/21 262 lb 9.6 oz (119.1 kg)  04/13/21 257 lb (116.6 kg)  11/05/20 269 lb 12.8 oz (122.4 kg)   Covid series Hep C screening Tetanus Shingrix   Discussed GLP-1 agonists for her- she will look into this, she will need to find out more about family  history as her brother did have thyroid cancer  Patient Active Problem List   Diagnosis Date Noted   S/P TKR (total knee replacement), bilateral 09/27/2018   Bilateral primary osteoarthritis of knee 09/26/2018   Obesity (BMI 35.0-39.9 without comorbidity) 04/06/2018   High triglycerides 04/06/2018   Osteoarthritis of left knee 12/21/2017   Chest pain 07/09/2017   Fibromyalgia 07/09/2017   Hypertension 07/09/2017   Abnormal weight gain 01/31/2014   Empty sella syndrome ([place]) 01/31/2014   Prediabetes 01/31/2014   Neuropathy, peripheral 12/25/2013    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   Chicken pox    Depression    hx of in past situational   Fibromyalgia    GERD (gastroesophageal reflux disease)    Headache    hx of migraines   History of kidney stones    1996   Hypertension     Past Surgical History:  Procedure Laterality Date   ABDOMINAL HYSTERECTOMY     CHOLECYSTECTOMY     03-31-18 [place] N/A 03/31/2018   Procedure: LAPAROSCOPIC CHOLECYSTECTOMY;  Surgeon: Ralene Ok, MD;  Location: [facility];  Service: General;  Laterality: N/A;   COLONOSCOPY     endoplantar fasciotomy     GALLBLADDER SURGERY  03/31/2018   KNEE ARTHROSCOPY     left knee 2012   LAPAROSCOPIC CHOLECYSTECTOMY     REPLACEMENT TOTAL KNEE BILATERAL Bilateral 09/27/2018   TONSILLECTOMY     1999   TOTAL KNEE ARTHROPLASTY Bilateral 09/27/2018   Procedure: TOTAL KNEE BILATERAL;  Surgeon: Frederik Pear, MD;  Location: [facility];  Service: Orthopedics;  Laterality: Bilateral;   UPPER GASTROINTESTINAL ENDOSCOPY      Social History   Tobacco Use   Smoking status: Never   Smokeless tobacco: Never  Vaping Use   Vaping Use: Never used  Substance Use Topics   Alcohol use: Yes    Comment: occ   Drug use: No    Family History  Problem Relation Age of Onset   Heart disease Father    Prostate cancer Father     Diabetes Father    Irritable bowel syndrome Father    Kidney disease Father    Mitral valve prolapse Mother    Heart disease Mother    Breast cancer Mother    Pancreatic cancer Maternal Aunt    Liver cancer Paternal Uncle    Colon cancer Paternal Uncle    Esophageal cancer Neg Hx    Stomach cancer Neg Hx    Rectal cancer Neg Hx     Allergies  Allergen Reactions   Aspartame Other (See Comments)    Headaches.   Demerol [Meperidine] Nausea And Vomiting    Medication list has been reviewed and updated.  Current Outpatient Medications on File Prior to Visit  Medication Sig Dispense Refill   ALPRAZolam (XANAX) 0.25 MG tablet Take 1 tablet (0.25 mg total) by mouth 2 (two) times daily as needed for anxiety. 30 tablet 1   amLODipine (NORVASC) 10 MG tablet TAKE 1 TABLET BY MOUTH EVERY DAY 90 tablet 0   aspirin EC 81 MG tablet Take 1 tablet (81 mg total) by mouth 2 (two) times daily. (Patient taking differently: Take 81 mg by mouth 2 (two) times daily. Pt takes once daily at night) 60 tablet 0   B Complex Vitamins (B COMPLEX 1 PO) Take 1 tablet by mouth daily.      Biotin 5000 MCG CAPS Take 5,000 mcg by mouth daily.     Calcium Carb-Cholecalciferol 600-800 MG-UNIT TABS Take 1 tablet by mouth daily.     cholecalciferol (VITAMIN D3) 25 MCG (1000 UT) tablet Take 1,000 Units by mouth daily. Pt takes 5000 units daily     cyclobenzaprine (FLEXERIL) 10 MG tablet Take 1 tablet (10 mg total) by mouth at bedtime. PRN 30 tablet 5   DULoxetine (CYMBALTA) 60 MG capsule TAKE 1 CAPSULE BY MOUTH 2 TIMES DAILY. 180 capsule 3   gabapentin (NEURONTIN) 300 MG capsule Take 1 capsule (300 mg total) by mouth at bedtime. 90 capsule 2   hydrochlorothiazide (MICROZIDE) 12.5 MG capsule TAKE 1 CAPSULE BY MOUTH EVERY DAY 90 capsule 0   icosapent Ethyl (VASCEPA) 1 g capsule Take 2 capsules (2 g total) by mouth 2 (two) times daily. 360 capsule 3   meloxicam (MOBIC) 15 MG tablet TAKE 1 TABLET (15 MG TOTAL) BY MOUTH DAILY.  90 tablet 0   nitrofurantoin, macrocrystal-monohydrate, (MACROBID) 100 MG capsule Take 1 capsule (100 mg total) by mouth 2 (two) times daily. 14 capsule 0   omeprazole (PRILOSEC) 20 MG capsule Take 1 capsule (20 mg total) by mouth daily. [place]  capsule 3   rosuvastatin (CRESTOR) 20 MG tablet Take 1 tablet (20 mg total) by mouth daily. 90 tablet 3   traMADol (ULTRAM) 50 MG tablet Take 1 tablet (50 mg total) by mouth every 12 (twelve) hours as needed. 60 tablet 1   TURMERIC PO Take 1 capsule by mouth daily.     vitamin C (ASCORBIC ACID) 500 MG tablet Take 500 mg by mouth daily.     No current facility-administered medications on file prior to visit.    Review of Systems:  As per HPI- otherwise negative.   Physical Examination: Vitals:   05/21/21 1153  BP: 118/80  Pulse: 69  Resp: 18  Temp: 97.9 F (36.6 C)  SpO2: 97%   Vitals:   05/21/21 1153  Weight: 262 lb 9.6 oz (119.1 kg)   Body mass index is 43.7 kg/m. Ideal Body Weight:    GEN: no acute distress. Obese, looks well  HEENT: Atraumatic, Normocephalic.  Ears and Nose: No external deformity. CV: RRR, No M/G/R. No JVD. No thrill. No extra heart sounds. PULM: CTA B, no wheezes, crackles, rhonchi. No retractions. No resp. distress. No accessory muscle use. ABD: S, NT, ND, +BS. No rebound. No HSM. EXTR: No c/c/e PSYCH: Normally interactive. Conversant.   Assessment and Plan: Morbid obesity ([place])  Gastroesophageal reflux disease without esophagitis  Prediabetes  Hospital discharge follow-up Patient seen today for follow-up from recent ER visit.  She was seen in the ER at [facility] a week ago.  Gastroenterology actually consulted while she was in the ER and did an upper GI.  She has since increased her omeprazole and stopped taking metformin, her GERD symptoms are somewhat better.  She plans to see her gastroenterologist afternoon to discuss neck steps.  She does also have a hiatal hernia.  She is concerned that  her weight, apparently her GI doctor does some sort of weight loss procedure which she may consider.  We also discussed medication such as Saxenda today  She does have prediabetes, had to stop using metformin due to GI side effects  I treated her for UTI noted in the hospital  She will follow-up with me after her GI appointment  This visit occurred during the SARS-CoV-2 public health emergency.  Safety protocols were in place, including screening questions prior to the visit, additional usage of staff PPE, and extensive cleaning of exam room while observing appropriate contact time as indicated for disinfecting solutions.   Signed Lamar Blinks, MD

## 2021-05-21 ENCOUNTER — Telehealth: Payer: Self-pay | Admitting: *Deleted

## 2021-05-21 ENCOUNTER — Other Ambulatory Visit: Payer: Self-pay

## 2021-05-21 ENCOUNTER — Ambulatory Visit: Payer: Managed Care, Other (non HMO) | Admitting: Family Medicine

## 2021-05-21 DIAGNOSIS — R7303 Prediabetes: Secondary | ICD-10-CM

## 2021-05-21 DIAGNOSIS — K219 Gastro-esophageal reflux disease without esophagitis: Secondary | ICD-10-CM

## 2021-05-21 DIAGNOSIS — Z09 Encounter for follow-up examination after completed treatment for conditions other than malignant neoplasm: Secondary | ICD-10-CM

## 2021-05-21 NOTE — Telephone Encounter (Signed)
Received a call from [place] at [facility] they do not take this patients insurance. CPAP order sent to [facility].

## 2021-05-21 NOTE — Patient Instructions (Addendum)
It was good to see you today - let me know if you are interested in trying a medication like Saxenda assuming there is no family history of medullary type thyroid cancer   I would also recommend that you consider the singles vaccine (shingrix) at your convenience

## 2021-06-08 ENCOUNTER — Encounter: Payer: Self-pay | Admitting: Family Medicine

## 2021-06-09 MED ORDER — TRAMADOL HCL 50 MG PO TABS: 50.0000 mg | ORAL_TABLET | Freq: Two times a day (BID) | ORAL | 1 refills | Status: DC | PRN

## 2021-06-09 MED ORDER — CYCLOBENZAPRINE HCL 10 MG PO TABS: 10.0000 mg | ORAL_TABLET | Freq: Every day | ORAL | 2 refills | Status: DC

## 2021-06-25 ENCOUNTER — Telehealth: Payer: Self-pay | Admitting: *Deleted

## 2021-06-25 NOTE — Telephone Encounter (Signed)
Returned a call to the patient concerning her CPAP machine. She is very upset with the process. Her CPAP order was originally sent to [facility].  [facility] did not take her insurance so they in turn sent the order to [facility] , who does take her insurance. [facility] contacted the patient to inform her that the order has been received. During their conversation with the patient they informed her that due to the nationwide back order of the CPAP machines, that it could take 6 months to 1 year before she can get a CPAP machine. This totally infuriated her. She stated to me that this is ridiculous. I was going to try to explain to the patient the total situation, but at this point she was too upset and I could tell she didn't want to hear anything that I had to say. I told  her that I had to send not just the CPAP RX, but also records with it to complete the order. She continued to insist that I send her a RX. She asked if I could send her everything through her Mychart. I told her that I could not. I again asked her the name of the company she wanted me to send her order to. Her response was " So I can't have my records?" I told her that I was not saying that she could not have her records. I just can't send her records and prescription through [facility]. She asked  if I can send her my contact information via e-mail. She is just "pulling into work." I told her sure I could. She provided me her e-mail address to send it to. Call ended so I am not sure if she will send me the medical equipment company's information.

## 2021-06-26 ENCOUNTER — Telehealth: Payer: Self-pay | Admitting: *Deleted

## 2021-06-26 NOTE — Telephone Encounter (Signed)
Patient called in today and asked for me to send her CPAP prescription to [facility]. She provided me with a fax # as well as e-mail address. CPAP RX was faxed to the number provided. I called the number to be sure the order has been received. Per the rep the patient has ordered a  Airview "mini travel machine." I told him this is not what the physician is ordering. He is ordering a Airsense 11. He states that they are on extensive back order.( Which I told him I knew.) I'm not sure if they told her the difference between the two devices. He continues to tell me how the algorithms and the machine is still the same as the Airsense 11 except for the size. Again I told the representative a mini is not what is being ordered. Before I can send them a order for a mini I will have to get a order from the MD, who will not return to the office before Monday. If he approves this I will send him a order for the mini.

## 2021-06-30 ENCOUNTER — Telehealth: Payer: Self-pay | Admitting: *Deleted

## 2021-06-30 NOTE — Telephone Encounter (Signed)
Patient returned a call to me and her CPAP order was discussed. See other phone note from earlier today.

## 2021-06-30 NOTE — Telephone Encounter (Signed)
Left message to return a call. I am following up on our recent conversation concerning her CPAP.

## 2021-06-30 NOTE — Telephone Encounter (Signed)
Called and spoke with Min at [facility] to confirm that they have the Airsense auto 11 RX that was faxed to them. He states they do and the patient's machine has been shipped out. I asked him surprisingly if it was the Airsense 11 that was ordered by the MD. He states no it it the mini unit. I went on to question him how could they send out the mini when this is not what was ordered. He states that it " falls under the same category." I proceeded to tell him this is not what the MD wants her to have. His response was " I don't understand why he doesn't want her to have this machine which is the exact same thing and have her wait for four months." He then proceeded to give me an analogy of "it's just like going to school and they want you to use a BIC pen but you use a different one instead."  I told him that I had spoken with the patient earlier in the day and she may be returning the unit. She told me, after she was given Dr Evette Georges view on her using the mini, she will just wait on the machine that was originally ordered. I asked Min for a call reference # and he gave me the patient's order #. [id number].

## 2021-06-30 NOTE — Telephone Encounter (Signed)
Received a call from the patient in response to my e-mail informing her per Dr Claiborne Billings that he does not recommend the "mini" CPAP travel machine  as a primary machine. Per Dr Claiborne Billings he does not feel the algorithms are equivalent to the Airsense 11 unit that he has ordered. The patient went on to say that "people use them all of the time." I told her that I am just telling her what Dr Evette Georges recommendations are. She then states that she will just wait for the machine that was originally ordered by him, asking if she can still get her machine from [facility]. I told her yes she could and the order has already been sent to them. The patient then states that they don't have it. I assured her that they have the prescription. They do not have a RX for the mini, but a RX was sent to them for a Airsense 11 auto unit on 06/26/21 as she requested.

## 2021-07-15 ENCOUNTER — Other Ambulatory Visit: Payer: Self-pay | Admitting: Family Medicine

## 2021-07-15 DIAGNOSIS — Z1231 Encounter for screening mammogram for malignant neoplasm of breast: Secondary | ICD-10-CM

## 2021-08-13 ENCOUNTER — Other Ambulatory Visit: Payer: Self-pay | Admitting: Family Medicine

## 2021-08-14 ENCOUNTER — Other Ambulatory Visit: Payer: Self-pay | Admitting: Family Medicine

## 2021-08-24 ENCOUNTER — Encounter: Payer: Self-pay | Admitting: Family Medicine

## 2021-08-24 MED ORDER — CEPHALEXIN 500 MG PO CAPS: 500.0000 mg | ORAL_CAPSULE | Freq: Two times a day (BID) | ORAL | 0 refills | Status: DC

## 2021-09-01 ENCOUNTER — Ambulatory Visit: Payer: Managed Care, Other (non HMO)

## 2021-09-10 ENCOUNTER — Encounter: Payer: Managed Care, Other (non HMO) | Admitting: Family Medicine

## 2021-09-13 ENCOUNTER — Other Ambulatory Visit: Payer: Self-pay | Admitting: Family Medicine

## 2021-09-22 NOTE — Patient Instructions (Addendum)
It was good to see you again today, I will be in touch with your labs are soon as possible  I do recommend vaccination against seasonal flu, COVID-19, pneumonia, and shingles  I will complete your insurance paperwork and fax for you once your labs come in

## 2021-09-22 NOTE — Progress Notes (Addendum)
[facility] [place], [place], [place] [address] [phone]  Date:  09/24/2021   Name:  Dawn Reynolds   DOB:  1966/01/13   MRN:  [id number]  PCP:  Darreld Mclean, MD    Chief Complaint: Annual Exam (Concerns/ questions: recurrent UTI, would like a UA today/Flu shot today: received at work/PNA: declines/Covid: declines/Zoster: declines/Hep C screen due today)   History of Present Illness:  Dawn Reynolds is a 55 y.o. very pleasant female patient who presents with the following:  Patient seen today for physical exam Most recent visit with myself was in July History of hypertension, dyslipidemia, prediabetes, obesity and neuropathy  At her last visit Delphina was working hard on weight loss but not having much success which was frustrating for her We talked about starting a GLP-1, but there was a questionable family history of thyroid cancer.  Her brother has a history of thyroid cancer, but she is not certain exactly what type.  She plans to check into this She does plan to start a weight loss clinic fairly soon, I advised that they may suggest a GLP-1  She also was struggling with GERD and excessive belching, she was planning to see her gastroenterologist to discuss this issue She updated me later that she was taking omeprazole and had completed 2 weeks of Xifaxin with good results- she is seeing her GI doc in [place] coming up soon   She notes that her urine may be cloudy, but she does not have any particular dysuria right now-however, she would like to check her urine to make sure it is clear She used macrobid on a prn basis and needs more  She notes a family history of "urology issues"   Lab Results  Component Value Date   HGBA1C 6.3 10/24/2020   Patient has declined COVID vaccination Can offer pneumonia vaccine, tetanus, flu, shingles.  Flu done at work, she declines other vaccines Mammogram is due soon- she  will set this up  Colonoscopy is up-to-date Due for labs today Patient Active Problem List   Diagnosis Date Noted   S/P TKR (total knee replacement), bilateral 09/27/2018   Bilateral primary osteoarthritis of knee 09/26/2018   Obesity (BMI 35.0-39.9 without comorbidity) 04/06/2018   High triglycerides 04/06/2018   Osteoarthritis of left knee 12/21/2017   Chest pain 07/09/2017   Fibromyalgia 07/09/2017   Hypertension 07/09/2017   Abnormal weight gain 01/31/2014   Empty sella syndrome ([place]) 01/31/2014   Prediabetes 01/31/2014   Neuropathy, peripheral 12/25/2013    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   Chicken pox    Depression    hx of in past situational   Fibromyalgia    GERD (gastroesophageal reflux disease)    Headache    hx of migraines   History of kidney stones    1996   Hypertension     Past Surgical History:  Procedure Laterality Date   ABDOMINAL HYSTERECTOMY     CHOLECYSTECTOMY     03-31-18 [place] N/A 03/31/2018   Procedure: LAPAROSCOPIC CHOLECYSTECTOMY;  Surgeon: Ralene Ok, MD;  Location: [facility];  Service: General;  Laterality: N/A;   COLONOSCOPY     endoplantar fasciotomy     GALLBLADDER SURGERY  03/31/2018   KNEE ARTHROSCOPY     left knee 2012  LAPAROSCOPIC CHOLECYSTECTOMY     REPLACEMENT TOTAL KNEE BILATERAL Bilateral 09/27/2018   TONSILLECTOMY     1999   TOTAL KNEE ARTHROPLASTY Bilateral 09/27/2018   Procedure: TOTAL KNEE BILATERAL;  Surgeon: Frederik Pear, MD;  Location: [facility];  Service: Orthopedics;  Laterality: Bilateral;   UPPER GASTROINTESTINAL ENDOSCOPY      Social History   Tobacco Use   Smoking status: Never   Smokeless tobacco: Never  Vaping Use   Vaping Use: Never used  Substance Use Topics   Alcohol use: Yes    Comment: occ   Drug use: No    Family History  Problem Relation Age of Onset   Heart disease Father    Prostate  cancer Father    Diabetes Father    Irritable bowel syndrome Father    Kidney disease Father    Mitral valve prolapse Mother    Heart disease Mother    Breast cancer Mother    Pancreatic cancer Maternal Aunt    Liver cancer Paternal Uncle    Colon cancer Paternal Uncle    Esophageal cancer Neg Hx    Stomach cancer Neg Hx    Rectal cancer Neg Hx     Allergies  Allergen Reactions   Aspartame Other (See Comments)    Headaches.   Demerol [Meperidine] Nausea And Vomiting    Medication list has been reviewed and updated.  Current Outpatient Medications on File Prior to Visit  Medication Sig Dispense Refill   ALPRAZolam (XANAX) 0.25 MG tablet Take 1 tablet (0.25 mg total) by mouth 2 (two) times daily as needed for anxiety. 30 tablet 1   amLODipine (NORVASC) 10 MG tablet TAKE 1 TABLET BY MOUTH EVERY DAY 90 tablet 0   aspirin EC 81 MG tablet Take 1 tablet (81 mg total) by mouth 2 (two) times daily. (Patient taking differently: Take 81 mg by mouth 2 (two) times daily. Pt takes once daily at night) 60 tablet 0   B Complex Vitamins (B COMPLEX 1 PO) Take 1 tablet by mouth daily.      Biotin 5000 MCG CAPS Take 5,000 mcg by mouth daily.     Calcium Carb-Cholecalciferol 600-800 MG-UNIT TABS Take 1 tablet by mouth daily.     cholecalciferol (VITAMIN D3) 25 MCG (1000 UT) tablet Take 1,000 Units by mouth daily. Pt takes 5000 units daily     cyclobenzaprine (FLEXERIL) 10 MG tablet Take 1 tablet (10 mg total) by mouth at bedtime. PRN 30 tablet 2   DULoxetine (CYMBALTA) 60 MG capsule TAKE 1 CAPSULE BY MOUTH 2 TIMES DAILY. 180 capsule 3   gabapentin (NEURONTIN) 300 MG capsule Take 1 capsule (300 mg total) by mouth at bedtime. 90 capsule 2   hydrochlorothiazide (MICROZIDE) 12.5 MG capsule TAKE 1 CAPSULE BY MOUTH EVERY DAY 90 capsule 0   icosapent Ethyl (VASCEPA) 1 g capsule Take 2 capsules (2 g total) by mouth 2 (two) times daily. 360 capsule 3   meloxicam (MOBIC) 15 MG tablet TAKE 1 TABLET (15 MG  TOTAL) BY MOUTH DAILY. 90 tablet 0   nitrofurantoin, macrocrystal-monohydrate, (MACROBID) 100 MG capsule Take 1 capsule (100 mg total) by mouth 2 (two) times daily. 14 capsule 0   omeprazole (PRILOSEC) 20 MG capsule TAKE 1 CAPSULE BY MOUTH EVERY DAY 90 capsule 1   rosuvastatin (CRESTOR) 20 MG tablet TAKE 1 TABLET BY MOUTH EVERY DAY 90 tablet 3   traMADol (ULTRAM) 50 MG tablet Take 1 tablet (50 mg total) by mouth every 12 (twelve)  hours as needed. 60 tablet 1   TURMERIC PO Take 1 capsule by mouth daily.     vitamin C (ASCORBIC ACID) 500 MG tablet Take 500 mg by mouth daily.     No current facility-administered medications on file prior to visit.    Review of Systems:  As per HPI- otherwise negative.   Physical Examination: Vitals:   09/24/21 1319  BP: 124/80  Pulse: 92  Resp: 18  Temp: (!) 97.4 F (36.3 C)  SpO2: 95%   Vitals:   09/24/21 1319  Weight: 259 lb 6.4 oz (117.7 kg)  Height: 5\' 5"  (1.651 m)   Body mass index is 43.17 kg/m. Ideal Body Weight: Weight in (lb) to have BMI = 25: 149.9  GEN: no acute distress.  Obese, otherwise looks well HEENT: Atraumatic, Normocephalic.  Bilateral TM wnl, oropharynx normal.  PEERL,EOMI.   Ears and Nose: No external deformity. CV: RRR, No M/G/R. No JVD. No thrill. No extra heart sounds. PULM: CTA B, no wheezes, crackles, rhonchi. No retractions. No resp. distress. No accessory muscle use. ABD: S, NT, ND, +BS. No rebound. No HSM. EXTR: No c/c/e PSYCH: Normally interactive. Conversant.    Assessment and Plan: Physical exam  Morbid obesity ([place]) - Plan: CBC, Comprehensive metabolic panel  Prediabetes - Plan: Comprehensive metabolic panel, Hemoglobin A1c  Gastroesophageal reflux disease, unspecified whether esophagitis present  High triglycerides - Plan: Lipid panel  Fatigue, unspecified type - Plan: TSH, VITAMIN D 25 Hydroxy (Vit-D Deficiency, Fractures)  Dysuria - Plan: Urine Culture, POCT urinalysis dipstick,  nitrofurantoin, macrocrystal-monohydrate, (MACROBID) 100 MG capsule  Fibromyalgia - Plan: cyclobenzaprine (FLEXERIL) 10 MG tablet   Physical exam today.  Encouraged healthy diet and exercise routine  I have an insurance form for her which will be completed and faxed once her labs come in  Will plan further follow- up pending labs. Not enough urine for UA- will just do culture for her today   Signed Lamar Blinks, MD  Received labs as below, message to patient Results for orders placed or performed in visit on 09/24/21  CBC  Result Value Ref Range   WBC 7.9 4.0 - 10.5 K/uL   RBC 5.20 (H) 3.87 - 5.11 Mil/uL   Platelets 251.0 150.0 - 400.0 K/uL   Hemoglobin 15.1 (H) 12.0 - 15.0 g/dL   HCT 45.4 36.0 - 46.0 %   MCV 87.2 78.0 - 100.0 fl   MCHC 33.3 30.0 - 36.0 g/dL   RDW 15.1 11.5 - 15.5 %  Comprehensive metabolic panel  Result Value Ref Range   Sodium 141 135 - 145 mEq/L   Potassium 3.5 3.5 - 5.1 mEq/L   Chloride 103 96 - 112 mEq/L   CO2 27 19 - 32 mEq/L   Glucose, Bld 107 (H) 70 - 99 mg/dL   BUN 10 6 - 23 mg/dL   Creatinine, Ser 0.83 0.40 - 1.20 mg/dL   Total Bilirubin 1.4 (H) 0.2 - 1.2 mg/dL   Alkaline Phosphatase 65 39 - 117 U/L   AST 36 0 - 37 U/L   ALT 32 0 - 35 U/L   Total Protein 6.9 6.0 - 8.3 g/dL   Albumin 4.5 3.5 - 5.2 g/dL   GFR 79.46 >60.00 mL/min   Calcium 9.2 8.4 - 10.5 mg/dL  Hemoglobin A1c  Result Value Ref Range   Hgb A1c MFr Bld 6.2 4.6 - 6.5 %  Lipid panel  Result Value Ref Range   Cholesterol 123 0 - 200 mg/dL  Triglycerides 179.0 (H) 0.0 - 149.0 mg/dL   HDL 62.60 >39.00 mg/dL   VLDL 35.8 0.0 - 40.0 mg/dL   LDL Cholesterol 25 0 - 99 mg/dL   Total CHOL/HDL Ratio 2    NonHDL 60.82   TSH  Result Value Ref Range   TSH 1.39 0.35 - 5.50 uIU/mL  VITAMIN D 25 Hydroxy (Vit-D Deficiency, Fractures)  Result Value Ref Range   VITD 53.01 30.00 - 100.00 ng/mL    Received urine culture 11/9- message to pt, negative

## 2021-09-23 ENCOUNTER — Other Ambulatory Visit: Payer: Self-pay

## 2021-09-24 ENCOUNTER — Ambulatory Visit (INDEPENDENT_AMBULATORY_CARE_PROVIDER_SITE_OTHER): Payer: Managed Care, Other (non HMO) | Admitting: Family Medicine

## 2021-09-24 ENCOUNTER — Other Ambulatory Visit: Payer: Self-pay

## 2021-09-24 ENCOUNTER — Encounter: Payer: Self-pay | Admitting: Family Medicine

## 2021-09-24 VITALS — BP 124/80 | HR 92 | Temp 97.4°F | Resp 18 | Ht 65.0 in | Wt 259.4 lb

## 2021-09-24 DIAGNOSIS — E781 Pure hyperglyceridemia: Secondary | ICD-10-CM

## 2021-09-24 DIAGNOSIS — K219 Gastro-esophageal reflux disease without esophagitis: Secondary | ICD-10-CM | POA: Diagnosis not present

## 2021-09-24 DIAGNOSIS — R7303 Prediabetes: Secondary | ICD-10-CM | POA: Diagnosis not present

## 2021-09-24 DIAGNOSIS — R300 Dysuria: Secondary | ICD-10-CM

## 2021-09-24 DIAGNOSIS — Z0000 Encounter for general adult medical examination without abnormal findings: Secondary | ICD-10-CM | POA: Diagnosis not present

## 2021-09-24 DIAGNOSIS — R5383 Other fatigue: Secondary | ICD-10-CM

## 2021-09-24 DIAGNOSIS — M797 Fibromyalgia: Secondary | ICD-10-CM

## 2021-09-24 LAB — HEMOGLOBIN A1C: Hgb A1c MFr Bld: 6.2 % (ref 4.6–6.5)

## 2021-09-24 LAB — LIPID PANEL
Cholesterol: 123 mg/dL (ref 0–200)
HDL: 62.6 mg/dL (ref >39.00)
LDL Cholesterol: 25 mg/dL (ref 0–99)
NonHDL: 60.82
Total CHOL/HDL Ratio: 2
Triglycerides: 179 mg/dL — ABNORMAL HIGH (ref 0.0–149.0)
VLDL: 35.8 mg/dL (ref 0.0–40.0)

## 2021-09-24 LAB — COMPREHENSIVE METABOLIC PANEL
ALT: 32 U/L (ref 0–35)
AST: 36 U/L (ref 0–37)
Albumin: 4.5 g/dL (ref 3.5–5.2)
Alkaline Phosphatase: 65 U/L (ref 39–117)
BUN: 10 mg/dL (ref 6–23)
CO2: 27 mEq/L (ref 19–32)
Calcium: 9.2 mg/dL (ref 8.4–10.5)
Chloride: 103 mEq/L (ref 96–112)
Creatinine, Ser: 0.83 mg/dL (ref 0.40–1.20)
GFR: 79.46 mL/min (ref >60.00)
Glucose, Bld: 107 mg/dL — ABNORMAL HIGH (ref 70–99)
Potassium: 3.5 mEq/L (ref 3.5–5.1)
Sodium: 141 mEq/L (ref 135–145)
Total Bilirubin: 1.4 mg/dL — ABNORMAL HIGH (ref 0.2–1.2)
Total Protein: 6.9 g/dL (ref 6.0–8.3)

## 2021-09-24 LAB — CBC
HCT: 45.4 % (ref 36.0–46.0)
Hemoglobin: 15.1 g/dL — ABNORMAL HIGH (ref 12.0–15.0)
MCHC: 33.3 g/dL (ref 30.0–36.0)
MCV: 87.2 fl (ref 78.0–100.0)
Platelets: 251 10*3/uL (ref 150.0–400.0)
RBC: 5.2 Mil/uL — ABNORMAL HIGH (ref 3.87–5.11)
RDW: 15.1 % (ref 11.5–15.5)
WBC: 7.9 10*3/uL (ref 4.0–10.5)

## 2021-09-24 LAB — TSH: TSH: 1.39 u[IU]/mL (ref 0.35–5.50)

## 2021-09-24 LAB — VITAMIN D 25 HYDROXY (VIT D DEFICIENCY, FRACTURES): VITD: 53.01 ng/mL (ref 30.00–100.00)

## 2021-09-24 MED ORDER — NITROFURANTOIN MONOHYD MACRO 100 MG PO CAPS: 100.0000 mg | ORAL_CAPSULE | Freq: Two times a day (BID) | ORAL | 0 refills | Status: DC

## 2021-09-24 MED ORDER — CYCLOBENZAPRINE HCL 10 MG PO TABS
10.0000 mg | ORAL_TABLET | Freq: Every day | ORAL | 2 refills | Status: DC
Start: 2021-09-24 — End: 2021-12-29

## 2021-09-25 LAB — URINE CULTURE
MICRO NUMBER:: 12651654
SPECIMEN QUALITY:: ADEQUATE

## 2021-09-26 ENCOUNTER — Encounter: Payer: Self-pay | Admitting: Family Medicine

## 2021-11-08 HISTORY — PX: HIP ARTHROPLASTY: SHX981

## 2021-11-09 ENCOUNTER — Other Ambulatory Visit: Payer: Self-pay | Admitting: Family Medicine

## 2021-11-09 DIAGNOSIS — M797 Fibromyalgia: Secondary | ICD-10-CM

## 2021-11-11 ENCOUNTER — Other Ambulatory Visit: Payer: Self-pay | Admitting: Family Medicine

## 2021-12-12 ENCOUNTER — Other Ambulatory Visit: Payer: Self-pay | Admitting: Family Medicine

## 2021-12-29 ENCOUNTER — Other Ambulatory Visit: Payer: Self-pay | Admitting: Family Medicine

## 2021-12-29 DIAGNOSIS — M797 Fibromyalgia: Secondary | ICD-10-CM

## 2022-01-13 ENCOUNTER — Encounter: Payer: Self-pay | Admitting: Family Medicine

## 2022-01-13 MED ORDER — TRAMADOL HCL 50 MG PO TABS: 50.0000 mg | ORAL_TABLET | Freq: Two times a day (BID) | ORAL | 1 refills | Status: DC | PRN

## 2022-01-14 MED ORDER — GABAPENTIN 300 MG PO CAPS
300.0000 mg | ORAL_CAPSULE | Freq: Three times a day (TID) | ORAL | 1 refills | Status: DC
Start: 2022-01-14 — End: 2022-07-07

## 2022-01-14 NOTE — Addendum Note (Signed)
Addended by: Lamar Blinks C on: 01/14/2022 08:44 AM ? ? Modules accepted: Orders ? ?

## 2022-01-26 ENCOUNTER — Encounter: Payer: Self-pay | Admitting: Family Medicine

## 2022-02-12 ENCOUNTER — Other Ambulatory Visit: Payer: Self-pay | Admitting: Family Medicine

## 2022-02-13 ENCOUNTER — Other Ambulatory Visit: Payer: Self-pay | Admitting: Family Medicine

## 2022-04-23 ENCOUNTER — Other Ambulatory Visit: Payer: Self-pay | Admitting: Family Medicine

## 2022-04-23 DIAGNOSIS — M797 Fibromyalgia: Secondary | ICD-10-CM

## 2022-05-14 ENCOUNTER — Other Ambulatory Visit: Payer: Self-pay | Admitting: Family Medicine

## 2022-05-14 DIAGNOSIS — M797 Fibromyalgia: Secondary | ICD-10-CM

## 2022-05-15 ENCOUNTER — Other Ambulatory Visit: Payer: Self-pay | Admitting: Family Medicine

## 2022-06-18 ENCOUNTER — Other Ambulatory Visit: Payer: Self-pay | Admitting: Family Medicine

## 2022-06-18 DIAGNOSIS — Z1231 Encounter for screening mammogram for malignant neoplasm of breast: Secondary | ICD-10-CM

## 2022-06-22 ENCOUNTER — Other Ambulatory Visit: Payer: Self-pay | Admitting: Family Medicine

## 2022-06-29 ENCOUNTER — Encounter: Payer: Self-pay | Admitting: Family Medicine

## 2022-07-07 ENCOUNTER — Other Ambulatory Visit: Payer: Self-pay | Admitting: Family Medicine

## 2022-08-07 ENCOUNTER — Other Ambulatory Visit: Payer: Self-pay | Admitting: Family Medicine

## 2022-08-07 DIAGNOSIS — M797 Fibromyalgia: Secondary | ICD-10-CM

## 2022-08-13 ENCOUNTER — Other Ambulatory Visit: Payer: Self-pay | Admitting: Family Medicine

## 2022-08-23 ENCOUNTER — Encounter: Payer: Self-pay | Admitting: Family Medicine

## 2022-08-23 ENCOUNTER — Encounter: Payer: Self-pay | Admitting: Cardiovascular Disease

## 2022-08-23 NOTE — Telephone Encounter (Signed)
Spoke to patient advised she will need a appointment to clear you for surgery.Advised she has not been seen in over 1 year.Appointment scheduled with [facility] 10/19 at 10:40 am.

## 2022-08-23 NOTE — Telephone Encounter (Signed)
Scheduled with Dr Nani Ravens 8:30 am Friday morning.

## 2022-08-24 ENCOUNTER — Telehealth: Payer: Self-pay

## 2022-08-24 NOTE — Telephone Encounter (Signed)
   Name: Taeya Theall  DOB: [birth year]  MRN: [id number]  Primary Cardiologist: Shelva Majestic, MD  Chart reviewed as part of pre-operative protocol coverage. Because of Rolando Dotzler's past medical history and time since last visit, she will require a follow-up in-office visit in order to better assess preoperative cardiovascular risk.  Pre-op covering staff: - Please schedule appointment and call patient to inform them. If patient already had an upcoming appointment within acceptable timeframe, please add "pre-op clearance" to the appointment notes so provider is aware. - Please contact requesting surgeon's office via preferred method (i.e, phone, fax) to inform them of need for appointment prior to surgery.   Lenna Sciara, NP  08/24/2022, 4:43 PM

## 2022-08-24 NOTE — Telephone Encounter (Signed)
   Pre-operative Risk Assessment    Patient Name: Dawn Reynolds  DOB: [birth year] MRN: [id number]      Request for Surgical Clearance    Procedure:   Right Anterior Hip Arthoplasty  Date of Surgery:  Clearance TBD                                 Surgeon:  Frederik Pear, MD Surgeon's Group or Practice Name:  [facility] Phone number:  [phone] Fax number:  [phone]   Type of Clearance Requested:   - Medical    Type of Anesthesia:  Spinal   Additional requests/questions:   N/A  Signed, Monia Pouch   08/24/2022, 10:56 AM

## 2022-08-25 ENCOUNTER — Telehealth: Payer: Self-pay | Admitting: *Deleted

## 2022-08-25 NOTE — Telephone Encounter (Signed)
Pt has appt 08/26/22 with Dr. Claiborne Billings. I will update appt notes need pre op clearance as well.

## 2022-08-26 ENCOUNTER — Encounter: Payer: Self-pay | Admitting: Cardiovascular Disease

## 2022-08-26 ENCOUNTER — Ambulatory Visit: Payer: Managed Care, Other (non HMO) | Attending: Cardiovascular Disease | Admitting: Cardiovascular Disease

## 2022-08-26 DIAGNOSIS — E782 Mixed hyperlipidemia: Secondary | ICD-10-CM | POA: Diagnosis not present

## 2022-08-26 DIAGNOSIS — I10 Essential (primary) hypertension: Secondary | ICD-10-CM

## 2022-08-26 DIAGNOSIS — G4733 Obstructive sleep apnea (adult) (pediatric): Secondary | ICD-10-CM | POA: Diagnosis not present

## 2022-08-26 DIAGNOSIS — Z01818 Encounter for other preprocedural examination: Secondary | ICD-10-CM

## 2022-08-26 DIAGNOSIS — E669 Obesity, unspecified: Secondary | ICD-10-CM

## 2022-08-26 NOTE — Progress Notes (Signed)
Cardiology Office Note    Date:  08/28/2022   ID:  Dawn Reynolds, DOB 1966/02/11, MRN [id number]  PCP:  Darreld Mclean, MD  Cardiologist:  Shelva Majestic, MD   F/U Cardiology evaluation/ Pre-operative clearance  History of Present Illness:  Dawn Reynolds is a 56 y.o. female who I initially saw on January 02, 2019 when she presented for new cardiology evaluation with a chief complaint of left-sided chest pain for several weeks.  I last saw her on May 21, 2019.  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  Currently she is working at [employer].  She presents today for preoperative clearance prior to undergoing right hip surgery.   Ms Barkley Boards has a history of significant hyperlipidemia but has never been on treatment.  Several years ago her triglycerides were over 600 and a year ago are over 400.  She was never started on medication but on her own instituted over-the-counter omega-3 fatty acid in addition to garlic and flaxseed oil.  Over the past 2 weeks, she has begun to notice left-sided chest pain radiating to her arm.  She would notice a fairly constant discomfort along the left side of her chest.  At times when she would rest around she would notice that perhaps she had slightly more discomfort.  She does have a longstanding history of fibromyalgia.  In November 2020 she underwent bilateral knee replacement surgery by Dr. Frederik Pear.  She admits to at least a 12 pound weight gain with some immobility since her surgery but apparently as of November 22, 2018 she returned to work and has been working full-time on her feet most of the day.  Due to several days of chest pain development she ultimately presented to [facility] emergency room on December 21, 2018 with atypical chest pain.  She had experienced constant chest pain at a low level but became more severe with activity.  She also had episodes of  increased perspiration.  In the emergency room, troponins were negative.  D-dimer was mildly positive at 2.56.  She had a normal lipase, normal LFTs, and she was not anemic with a hemoglobin of 12.8 and hematocrit of 42.1.  Chest x-ray revealed minimal scarring at the left base without edema or consolidation.  Because of her elevated d-dimer a CT scan was performed of her chest which was negative for PE.  She was noted to have bibasilar scarring.  She saw her primary physician, Dr. Beckie Salts in follow-up of her ER evaluation.  When she saw Dr. Margaretmary Bayley the patient requested lipid studies be obtained.  This showed slight improvement from her total cholesterol of 9 months previously at 246 which was now at 209.  Her triglycerides had improved from 403 but were still significantly elevated at 256.  VLDL was elevated at 51.2 and her HDL had risen to 51.1.  Direct LDL was 119.    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  Rosuvastatin 20 mg was initiated and I also discussed with her the "reduce it "trial data with Vascepa which was shown to have positive cardiovascular output benefit.  Since that time, she has been on therapy.  She admits to significant lifestyle changes and over these past 4 months has  lost 20 pounds.  I recommended she undergo a 2D echo Doppler study and a CT coronary angiography in light of her chest pain to further assess for potential coronary atherosclerosis.  However, with the COVID-19 pandemic the studies were canceled and therefore not yet performed.  At a follow-up visit on May 21, 2019 she felt well and denied any chest pain or shortness of breath.  She continued to have issues with fibromyalgia.  She denied PND orthopnea or palpitations.   Since I last saw her, a 2D echo Doppler study in July 2020 showed an  EF of 60 to 65% with mild LVH grade 1 diastolic dysfunction and no significant valvular abnormalities.  A coronary CTA in August 2020 showed a calcium score of 0 with no evidence for CAD.  She was subsequently evaluated by Krista Kroeger, PA on November 05, 2020 and due to concerns for obstructive sleep apnea she was referred for a home sleep study which was done on April 13, 2021.  This revealed very mild obstructive sleep apnea with an AHI of 7.4/h.  Supine sleep AHI was 8.9/h.  There was mild oxygen desaturation to a nadir of 86%.  Apparently I had never seen her in follow-up of her sleep study.    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  Most recently she has been working at [employer].  She was started on Auto-CPAP therapy but her machine has not been linked to our office.  Presently, she denies any chest pain or shortness of breath.  She has been significantly bothered by her fibromyalgia and particularly right hip bone-on-bone discomfort.  She is status post bilateral knee replacements in November 2019.  However she has recently developed increasing right hip pain despite undergoing  multiple injections.  She sees Dr. Frank Rowan at [facility] and is tentatively scheduled to undergo knee replacement surgery in November 2023.  She will be seeing her primary provider Dr. Wendling on August 20 and is scheduled to see just for [place] on November 20.  She presents for cardiology evaluation and preoperative clearance.   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   Chicken pox    Depression    hx of in past situational   Fibromyalgia    GERD (gastroesophageal reflux disease)    Headache    hx of migraines   History of kidney stones    1996   Hypertension     Past Surgical History:  Procedure Laterality Date   ABDOMINAL HYSTERECTOMY     CHOLECYSTECTOMY      03-31-18 Ramirez   CHOLECYSTECTOMY N/A 03/31/2018   Procedure: LAPAROSCOPIC CHOLECYSTECTOMY;  Surgeon: Ramirez, Armando, MD;  Location: [facility];  Service: General;  Laterality: N/A;   COLONOSCOPY     endoplantar fasciotomy     GALLBLADDER SURGERY  03/31/2018   KNEE ARTHROSCOPY     left knee 2012   LAPAROSCOPIC CHOLECYSTECTOMY     REPLACEMENT TOTAL KNEE BILATERAL Bilateral 09/27/2018   TONSILLECTOMY     1999   TOTAL KNEE ARTHROPLASTY Bilateral 09/27/2018   Procedure: TOTAL KNEE BILATERAL;  Surgeon: Rowan, Frank, MD;  Location: [facility];  Service: Orthopedics;  Laterality: Bilateral;   UPPER GASTROINTESTINAL ENDOSCOPY      Current Medications: Outpatient Medications Prior to Visit  Medication Sig Dispense Refill     ALPRAZolam (XANAX) 0.25 MG tablet Take 1 tablet (0.25 mg total) by mouth 2 (two) times daily as needed for anxiety. 30 tablet 1   amLODipine (NORVASC) 10 MG tablet TAKE 1 TABLET BY MOUTH EVERY DAY 90 tablet 1   aspirin EC 81 MG tablet Take 1 tablet (81 mg total) by mouth 2 (two) times daily. (Patient taking differently: Take 81 mg by mouth 2 (two) times daily. Pt takes once daily at night) 60 tablet 0   Calcium Carb-Cholecalciferol 600-800 MG-UNIT TABS Take 1 tablet by mouth daily.     cholecalciferol (VITAMIN D3) 25 MCG (1000 UT) tablet Take 1,000 Units by mouth daily. Pt takes 5000 units daily     cyclobenzaprine (FLEXERIL) 10 MG tablet Take 1 tablet (10 mg total) by mouth at bedtime as needed for muscle spasms. 30 tablet 2   DULoxetine (CYMBALTA) 60 MG capsule TAKE 1 CAPSULE BY MOUTH TWICE A DAY 180 capsule 1   gabapentin (NEURONTIN) 300 MG capsule Take 1 capsule (300 mg total) by mouth 3 (three) times daily. 270 capsule 1   hydrochlorothiazide (MICROZIDE) 12.5 MG capsule TAKE 1 CAPSULE BY MOUTH EVERY DAY 90 capsule 1   icosapent Ethyl (VASCEPA) 1 g capsule TAKE 2 CAPSULES BY MOUTH 2 TIMES DAILY. 360 capsule 1   meloxicam (MOBIC) 15 MG tablet TAKE 1 TABLET (15 MG TOTAL) BY  MOUTH DAILY. 90 tablet 2   nitrofurantoin, macrocrystal-monohydrate, (MACROBID) 100 MG capsule Take 1 capsule (100 mg total) by mouth 2 (two) times daily. Use for 5-7 days as needed for UTI 30 capsule 0   omeprazole (PRILOSEC) 20 MG capsule Take 1 capsule (20 mg total) by mouth daily. 90 capsule 1   rosuvastatin (CRESTOR) 20 MG tablet TAKE 1 TABLET BY MOUTH EVERY DAY 90 tablet 3   traMADol (ULTRAM) 50 MG tablet Take 1 tablet (50 mg total) by mouth every 12 (twelve) hours as needed. 60 tablet 1   vitamin C (ASCORBIC ACID) 500 MG tablet Take 500 mg by mouth daily.     B Complex Vitamins (B COMPLEX 1 PO) Take 1 tablet by mouth daily.  (Patient not taking: Reported on 08/26/2022)     Biotin 5000 MCG CAPS Take 5,000 mcg by mouth daily. (Patient not taking: Reported on 08/26/2022)     TURMERIC PO Take 1 capsule by mouth daily. (Patient not taking: Reported on 08/26/2022)     No facility-administered medications prior to visit.     Allergies:   Aspartame and Demerol [meperidine]   Social History   Socioeconomic History   Marital status: Married    Spouse name: Not on file   Number of children: 2   Years of education: Not on file   Highest education level: Not on file  Occupational History   Occupation: Nurse    Employer: [employer]    Comment: CICU nurse  Tobacco Use   Smoking status: Never   Smokeless tobacco: Never  Vaping Use   Vaping Use: Never used  Substance and Sexual Activity   Alcohol use: Yes    Comment: occ   Drug use: No   Sexual activity: Not on file  Other Topics Concern   Not on file  Social History Narrative   Not on file   Social Determinants of Health   Financial Resource Strain: Not on file  Food Insecurity: Not on file  Transportation Needs: Not on file  Physical Activity: Not on file  Stress: Not on file  Social Connections:   Not on file     Socially she was born in [place].  She had attended [employer] as well as [employer].  She  initially had a degree in recreation therapy.  She has lived in [place], and ultimately worked in [place], [place], [place] in [place] before moving back to [place].  She is divorced x2 and has been married for 16 years.  She has 2 children and 6 step grandchildren.  She is a Equities trader.  There is no tobacco history.  She rarely drinks a glass of wine.  She does not routinely exercise.  Family History:  The patient's  family history includes Breast cancer in her mother; Colon cancer in her paternal uncle; Diabetes in her father; Heart disease in her father and mother; Irritable bowel syndrome in her father; Kidney disease in her father; Liver cancer in her paternal uncle; Mitral valve prolapse in her mother; Pancreatic cancer in her maternal aunt; Prostate cancer in her father.   Additional Family history is notable that her mother had rheumatic fever and underwent mitral valve replacement and also has a history of atrial fibrillation.  She is 72.  Her father has undergone 2 previous CABG revascularization surgeries one in his 91s with the second in his 66s.  He died at age 23 and had a history of prostate CA.  She has 2 brothers one was recently diagnosed with hypertension and another has thyroid cancer and melanoma.  ROS General: Negative; No fevers, chills, or night sweats; obesity HEENT: Negative; No changes in vision or hearing, sinus congestion, difficulty swallowing Pulmonary: Negative; No cough, wheezing, shortness of breath, hemoptysis Cardiovascular: Negative; No chest pain, presyncope, syncope, palpitations GI: GERD GU: Negative; No dysuria, hematuria, or difficulty voiding Musculoskeletal: Bilateral knee replacement; in need for new right hip surgery Hematologic/Oncology: Negative; no easy bruising, bleeding Rheumatologic: History of fibromyalgia Endocrine: Negative; no heat/cold intolerance; no diabetes Neuro: Negative; no changes in balance, headaches Skin: Negative; No  rashes or skin lesions Psychiatric: Negative; No behavioral problems, depression Sleep: Mild OSA, on CPAP  An Epworth Sleepiness Scale score was calculated in the office today and this endorsed at 5, arguing against residual daytime sleepiness.  Other comprehensive 14 point system review is negative.   PHYSICAL EXAM:   VS:  BP 118/88   Pulse 69   Ht 5' 6" (1.676 m)   Wt 238 lb 3.2 oz (108 kg)   LMP 06/09/2015 (Approximate)   SpO2 98%   BMI 38.45 kg/m     Repeat blood pressure by me 122/80  Wt Readings from Last 3 Encounters:  08/27/22 239 lb 2 oz (108.5 kg)  08/26/22 238 lb 3.2 oz (108 kg)  09/24/21 259 lb 6.4 oz (117.7 kg)    General: Alert, oriented, no distress.  Skin: normal turgor, no rashes, warm and dry HEENT: Normocephalic, atraumatic. Pupils equal round and reactive to light; sclera anicteric; extraocular muscles intact;  Nose without nasal septal hypertrophy Mouth/Parynx benign; Mallinpatti scale 3 Neck: No JVD, no carotid bruits; normal carotid upstroke Lungs: clear to ausculatation and percussion; no wheezing or rales Chest wall: without tenderness to palpitation Heart: PMI not displaced, RRR, s1 s2 normal, 1/6 systolic murmur, no diastolic murmur, no rubs, gallops, thrills, or heaves Abdomen: soft, nontender; no hepatosplenomehaly, BS+; abdominal aorta nontender and not dilated by palpation. Back: no CVA tenderness Pulses 2+ Musculoskeletal: full range of motion, normal strength, no joint deformities Extremities: no clubbing cyanosis or edema, Homan's sign negative  Neurologic: grossly nonfocal;  Cranial nerves grossly wnl Psychologic: Normal mood and affect     Studies/Labs Reviewed:   August 26, 2022  ECG (independently read by me): NSR at 71; T wave abnormality V1-2.  January 02, 2019 ECG (independently read by me): Normal sinus rhythm at 73 bpm.  Nonspecific T wave abnormality in V1-2.  Normal intervals.  No ectopy.  Recent Labs:    Latest Ref  Rng & Units 09/24/2021    1:49 PM 06/12/2020    3:29 PM 06/02/2020   12:00 AM  BMP  Glucose 70 - 99 mg/dL 107  100    BUN 6 - 23 mg/dL 10  15  17      Creatinine 0.40 - 1.20 mg/dL 0.83  0.86  1.3      Sodium 135 - 145 mEq/L 141  140    Potassium 3.5 - 5.1 mEq/L 3.5  3.8    Chloride 96 - 112 mEq/L 103  106    CO2 19 - 32 mEq/L 27  27    Calcium 8.4 - 10.5 mg/dL 9.2  9.3       This result is from an external source.        Latest Ref Rng & Units 09/24/2021    1:49 PM 06/02/2020   12:00 AM 05/21/2019   11:16 AM  Hepatic Function  Total Protein 6.0 - 8.3 g/dL 6.9   6.9   Albumin 3.5 - 5.2 g/dL 4.5   4.7   AST 0 - 37 U/L 36  35     24   ALT 0 - 35 U/L 32  38     19   Alk Phosphatase 39 - 117 U/L 65  75     62   Total Bilirubin 0.2 - 1.2 mg/dL 1.4   1.2      This result is from an external source.       Latest Ref Rng & Units 09/24/2021    1:49 PM 06/02/2020   12:00 AM 12/21/2018   10:51 AM  CBC  WBC 4.0 - 10.5 K/uL 7.9   8.2   Hemoglobin 12.0 - 15.0 g/dL 15.1  6.1     12.8   Hematocrit 36.0 - 46.0 % 45.4   42.1   Platelets 150.0 - 400.0 K/uL 251.0   342      This result is from an external source.   Lab Results  Component Value Date   MCV 87.2 09/24/2021   MCV 84.2 12/21/2018   MCV 91.5 09/28/2018   Lab Results  Component Value Date   TSH 1.39 09/24/2021   Lab Results  Component Value Date   HGBA1C 6.2 09/24/2021     BNP No results found for: "BNP"  ProBNP No results found for: "PROBNP"   Lipid Panel     Component Value Date/Time   CHOL 123 09/24/2021 1349   CHOL 103 05/21/2019 1116   TRIG 179.0 (H) 09/24/2021 1349   HDL 62.60 09/24/2021 1349   HDL 49 05/21/2019 1116   CHOLHDL 2 09/24/2021 1349   VLDL 35.8 09/24/2021 1349   LDLCALC 25 09/24/2021 1349   LDLCALC 33 05/21/2019 1116   LDLDIRECT 119.0 12/28/2018 1302     RADIOLOGY: No results found.    Additional studies/ records that were reviewed today include:  I reviewed the patient's  hospital records, as well as her office evaluation with Dr. Jessica Copland and Krista Kroeger, PA-C.  ECHO: 06/04/2019  1. The left ventricle has normal systolic   function with an ejection  fraction of 60-65%. The cavity size was normal. There is mildly increased  left ventricular wall thickness. Left ventricular diastolic Doppler  parameters are consistent with impaired  relaxation.   2. The right ventricle has normal systolic function. The cavity was  normal.   3. The mitral valve is abnormal. Mild thickening of the mitral valve  leaflet.   4. The aortic valve is tricuspid. No stenosis of the aortic valve.   5. The aorta is normal in size and structure.   6. Normal LV systolic function; mild diastolic dysfunction; mild LVH.    CARDIAC CTA: 07/04/2019 FINDINGS: Non-cardiac: See separate report from [facility].   Pulmonary veins drain normally to the left atrium.   Calcium Score: 0 Agatston units.   Coronary Arteries: Right dominant with no anomalies   LM: No plaque or stenosis.   LAD system:  No plaque or stenosis.   Circumflex system: No plaque or stenosis.   RCA system: No plaque or stenosis.   IMPRESSION: 1. Coronary artery calcium score 0 Agatston units. This suggests low risk for future cardiac events.   2.  No significant coronary disease noted.    ASSESSMENT:    1. Preoperative clearance   2. Essential hypertension   3. OSA (obstructive sleep apnea)   4. Mixed hyperlipidemia   5. 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   At the time I felt her chest pain was atypical for coronary obstructive  disease and on initial evaluation she had definite chest wall tenderness suggesting a musculoskeletal etiology.  I commended her on her significant adjustment with diet since my initial evaluation.  She tells me her peak weight in 2017 was 290 pounds.  When I saw her on January 02, 2019 she weighed 252 pounds and 4 months later at follow-up she weighed 232 pounds.  She has significantly improved her diet and subsequent lipid studies were dramatically improved with cholesterol 103, triglycerides 104, HDL 49, LDL 33.  I commended her on her marked improvement and recommended she continue to take rosuvastatin and Vascepa as prescribed.  She was found to have normal LV function with EF 60 to 65% and grade 1 diastolic dysfunction with mild LVH on her echocardiographic evaluation in July 2020.  Coronary CTA did not reveal any coronary plaque and calcium score was 0.  Presently, she denies chest pain or significant shortness of breath.  Her blood pressure today is stable on her regimen of amlodipine 10 mg, hydrochlorothiazide 12.5 mg daily.  She continues to be on rosuvastatin 20 mg, Vascepa 2 capsules twice a day for her mixed hyperlipidemia.  She is on Cymbalta and gabapentin for her fibromyalgia.  She is now on CPAP therapy and apparently purchased a CPAP device with easy breathe supplier and has a ResMed air sense 10 unit and a ResMed AirFit N30i mask.  We will need to see if her CPAP device can be linked to our office for review and potential adjustments if necessary.  And given her clearance to undergo her upcoming hip replacement surgery.  She will be having follow-up labs with her primary physician.  I will see her in 1  year for follow-up evaluation or sooner as needed.   Medication Adjustments/Labs and Tests Ordered: Current medicines are reviewed at length with the patient today.  Concerns regarding medicines are outlined above.  Medication changes, Labs and Tests ordered today are listed in the Patient  Instructions below. Patient Instructions  Medication Instructions:  Your physician recommends that you continue on your current medications as directed. Please refer to the Current Medication list given to you today.  *If you need a refill on your cardiac medications before your next appointment, please call your pharmacy*  Follow-Up: At [facility], you and your health needs are our priority.  As part of our continuing mission to provide you with exceptional heart care, we have created designated Provider Care Teams.  These Care Teams include your primary Cardiologist (physician) and Advanced Practice Providers (APPs -  Physician Assistants and Nurse Practitioners) who all work together to provide you with the care you need, when you need it.  We recommend signing up for the patient portal called "MyChart".  Sign up information is provided on this After Visit Summary.  MyChart is used to connect with patients for Virtual Visits (Telemedicine).  Patients are able to view lab/test results, encounter notes, upcoming appointments, etc.  Non-urgent messages can be sent to your provider as well.   To learn more about what you can do with MyChart, go to [email or web address].    Your next appointment:   12 month(s)  The format for your next appointment:   In Person  Provider:   Shelva Majestic, MD           Signed, Shelva Majestic, MD  08/28/2022 10:59 AM    [facility] [address] [place], [place], [place]  [address] Phone: [phone]

## 2022-08-26 NOTE — Patient Instructions (Signed)
Medication Instructions:  Your physician recommends that you continue on your current medications as directed. Please refer to the Current Medication list given to you today.  *If you need a refill on your cardiac medications before your next appointment, please call your pharmacy*   Follow-Up: At [facility], you and your health needs are our priority.  As part of our continuing mission to provide you with exceptional heart care, we have created designated Provider Care Teams.  These Care Teams include your primary Cardiologist (physician) and Advanced Practice Providers (APPs -  Physician Assistants and Nurse Practitioners) who all work together to provide you with the care you need, when you need it.  We recommend signing up for the patient portal called "MyChart".  Sign up information is provided on this After Visit Summary.  MyChart is used to connect with patients for Virtual Visits (Telemedicine).  Patients are able to view lab/test results, encounter notes, upcoming appointments, etc.  Non-urgent messages can be sent to your provider as well.   To learn more about what you can do with MyChart, go to [email or web address].    Your next appointment:   12 month(s)  The format for your next appointment:   In Person  Provider:   Thomas Kelly, MD    

## 2022-08-27 ENCOUNTER — Encounter: Payer: Self-pay | Admitting: Family Medicine

## 2022-08-27 ENCOUNTER — Ambulatory Visit: Payer: Managed Care, Other (non HMO) | Admitting: Family Medicine

## 2022-08-27 VITALS — BP 114/81 | HR 84 | Temp 97.9°F | Ht 66.0 in | Wt 239.1 lb

## 2022-08-27 DIAGNOSIS — Z01818 Encounter for other preprocedural examination: Secondary | ICD-10-CM

## 2022-08-27 NOTE — Patient Instructions (Addendum)
We will fax over approval today.  Hold aspirin/meloxicam 5 days prior to planned procedure.   Let us know if you need anything.

## 2022-08-27 NOTE — Progress Notes (Signed)
Subjective:   Chief Complaint  Patient presents with   Pre-op Exam    Dawn Reynolds  is here for a Pre-operative physical at the request of Dr. Mayer Camel.   She  is having R hip surgery for OA.   Personal or family hx of adverse outcome to anesthesia? No  Chipped, cracked, missing, or loose teeth? No  Decreased ROM of neck? No  Able to walk up 2 flights of stairs without becoming significantly short of breath or having chest pain? Yes   Revised Goldman Criteria: High Risk Surgery (intraperitoneal, intrathoracic, aortic): No  Ischemic heart disease (Prior MI, +excercise stress test, angina, nitrate use, Qwave): No  History of heart failure: No  History of cerebrovascular disease: No  History of diabetes: No  Insulin therapy for DM: No  Preoperative Cr >2.0: No   Revised Goldman Criteria - risk for major cardiac death No risk factors -- 0.4 percent One risk factor -- 1.0 percent  Two risk factors -- 2.4 percent  Three or more risk factors -- 5.4 percent   Patient Active Problem List   Diagnosis Date Noted   S/P TKR (total knee replacement), bilateral 09/27/2018   Bilateral primary osteoarthritis of knee 09/26/2018   Obesity (BMI 35.0-39.9 without comorbidity) 04/06/2018   High triglycerides 04/06/2018   Osteoarthritis of left knee 12/21/2017   Chest pain 07/09/2017   Fibromyalgia 07/09/2017   Hypertension 07/09/2017   Abnormal weight gain 01/31/2014   Empty sella syndrome ([place]) 01/31/2014   Prediabetes 01/31/2014   Neuropathy, peripheral 12/25/2013   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   Chicken pox    Depression    hx of in past situational   Fibromyalgia    GERD (gastroesophageal reflux disease)    Headache    hx of migraines   History of kidney stones    1996   Hypertension     Past Surgical History:  Procedure Laterality Date   ABDOMINAL HYSTERECTOMY     CHOLECYSTECTOMY     03-31-18 [name]    CHOLECYSTECTOMY N/A 03/31/2018   Procedure: LAPAROSCOPIC CHOLECYSTECTOMY;  Surgeon: Ralene Ok, MD;  Location: [facility];  Service: General;  Laterality: N/A;   COLONOSCOPY     endoplantar fasciotomy     GALLBLADDER SURGERY  03/31/2018   KNEE ARTHROSCOPY     left knee 2012   LAPAROSCOPIC CHOLECYSTECTOMY     REPLACEMENT TOTAL KNEE BILATERAL Bilateral 09/27/2018   TONSILLECTOMY     1999   TOTAL KNEE ARTHROPLASTY Bilateral 09/27/2018   Procedure: TOTAL KNEE BILATERAL;  Surgeon: Frederik Pear, MD;  Location: [facility];  Service: Orthopedics;  Laterality: Bilateral;   UPPER GASTROINTESTINAL ENDOSCOPY      Current Outpatient Medications  Medication Sig Dispense Refill   ALPRAZolam (XANAX) 0.25 MG tablet Take 1 tablet (0.25 mg total) by mouth 2 (two) times daily as needed for anxiety. 30 tablet 1   amLODipine (NORVASC) 10 MG tablet TAKE 1 TABLET BY MOUTH EVERY DAY 90 tablet 1   aspirin EC 81 MG tablet Take 1 tablet (81 mg total) by mouth 2 (two) times daily. (Patient taking differently: Take 81 mg by mouth 2 (two) times daily. Pt takes once daily at night) 60 tablet 0   Calcium Carb-Cholecalciferol 600-800 MG-UNIT TABS Take 1 tablet by mouth daily.     cholecalciferol (VITAMIN D3) 25 MCG (1000 UT) tablet Take 1,000 Units  by mouth daily. Pt takes 5000 units daily     cyclobenzaprine (FLEXERIL) 10 MG tablet Take 1 tablet (10 mg total) by mouth at bedtime as needed for muscle spasms. 30 tablet 2   DULoxetine (CYMBALTA) 60 MG capsule TAKE 1 CAPSULE BY MOUTH TWICE A DAY 180 capsule 1   gabapentin (NEURONTIN) 300 MG capsule Take 1 capsule (300 mg total) by mouth 3 (three) times daily. 270 capsule 1   hydrochlorothiazide (MICROZIDE) 12.5 MG capsule TAKE 1 CAPSULE BY MOUTH EVERY DAY 90 capsule 1   icosapent Ethyl (VASCEPA) 1 g capsule TAKE 2 CAPSULES BY MOUTH 2 TIMES DAILY. 360 capsule 1   meloxicam (MOBIC) 15 MG tablet TAKE 1 TABLET (15 MG TOTAL) BY MOUTH DAILY. 90 tablet 2   nitrofurantoin,  macrocrystal-monohydrate, (MACROBID) 100 MG capsule Take 1 capsule (100 mg total) by mouth 2 (two) times daily. Use for 5-7 days as needed for UTI 30 capsule 0   omeprazole (PRILOSEC) 20 MG capsule Take 1 capsule (20 mg total) by mouth daily. 90 capsule 1   rosuvastatin (CRESTOR) 20 MG tablet TAKE 1 TABLET BY MOUTH EVERY DAY 90 tablet 3   traMADol (ULTRAM) 50 MG tablet Take 1 tablet (50 mg total) by mouth every 12 (twelve) hours as needed. 60 tablet 1   vitamin C (ASCORBIC ACID) 500 MG tablet Take 500 mg by mouth daily.     Allergies  Allergen Reactions   Aspartame Other (See Comments)    Headaches.   Demerol [Meperidine] Nausea And Vomiting    Family History  Problem Relation Age of Onset   Heart disease Father    Prostate cancer Father    Diabetes Father    Irritable bowel syndrome Father    Kidney disease Father    Mitral valve prolapse Mother    Heart disease Mother    Breast cancer Mother    Pancreatic cancer Maternal Aunt    Liver cancer Paternal Uncle    Colon cancer Paternal Uncle    Esophageal cancer Neg Hx    Stomach cancer Neg Hx    Rectal cancer Neg Hx      Review of Systems:  Constitutional:  no fevers Eye:  no recent significant change in vision Ear:  no hearing loss Nose/Mouth/Throat:  No dental complaints Neck/Thyroid:  no lumps or masses Pulmonary:  No shortness of breath Cardiovascular:  no chest pain Gastrointestinal:  no abdominal pain GU:  negative for dysuria Musculoskeletal/Extremities:  +R hip pain Skin/Integumentary ROS:  no abnormal skin lesions reported Neurologic:  no HA   Objective:   Vitals:   08/27/22 0839  BP: 114/81  Pulse: 84  Temp: 97.9 F (36.6 C)  TempSrc: Oral  SpO2: 96%  Weight: 239 lb 2 oz (108.5 kg)  Height: '5\' 6"'$  (1.676 m)   Body mass index is 38.6 kg/m.  General:  well developed, well nourished, in no apparent distress Skin:  warm, no pallor or diaphoresis Head:  normocephalic, atraumatic Eyes:  sclera  white Ears:  no external lesions Throat/Pharynx:  lips and gingiva without lesion; tongue and uvula midline; non-inflamed pharynx; no exudates or postnasal drainage Neck: neck supple without adenopathy, thyromegaly, or masses, no bruits, no jugular venous distention Lungs:  clear to auscultation, breath sounds equal bilaterally, no respiratory distress Cardio:  regular rate and rhythm without murmurs Abdomen:  abdomen soft, nontender; bowel sounds normal; no masses, hepatomegaly or splenomegaly Musculoskeletal:  symmetrical muscle groups noted without atrophy or deformity Extremities:  no clubbing, cyanosis,  or edema, no deformities, no skin discoloration Neuro:  gait antalgic; deep tendon reflexes normal and symmetric and alert and oriented to person, place, and time Psych: Age appropriate judgment and insight; normal mood   Assessment:   Pre-op exam   Plan:   Surgery team ordering labs per report. Cardiology reportedly cleared. Will hold NSAIDs 5 d prior to surgery. She is medically optimized otherwise.  Pending the above workup, the patient is deemed low cardiac risk for the proposed procedure.  The patient voiced understanding and agreement to the plan.  [place], DO 08/27/22  8:47 AM

## 2022-08-28 ENCOUNTER — Encounter: Payer: Self-pay | Admitting: Cardiovascular Disease

## 2022-08-30 ENCOUNTER — Ambulatory Visit
Admission: RE | Admit: 2022-08-30 | Discharge: 2022-08-30 | Disposition: A | Discharge status: Home / Self Care | Payer: Managed Care, Other (non HMO) | Source: Ambulatory Visit | Attending: Family Medicine | Admitting: Family Medicine

## 2022-08-30 DIAGNOSIS — Z1231 Encounter for screening mammogram for malignant neoplasm of breast: Secondary | ICD-10-CM

## 2022-09-20 ENCOUNTER — Other Ambulatory Visit: Payer: Self-pay | Admitting: Family Medicine

## 2022-09-20 DIAGNOSIS — R300 Dysuria: Secondary | ICD-10-CM

## 2022-09-20 MED ORDER — NITROFURANTOIN MONOHYD MACRO 100 MG PO CAPS: 100.0000 mg | ORAL_CAPSULE | Freq: Two times a day (BID) | ORAL | 0 refills | Status: DC

## 2022-09-26 NOTE — Progress Notes (Unsigned)
[facility] [address], [place], [place] [address] [phone]  Date:  09/27/2022   Name:  Dawn Reynolds   DOB:  1966/05/10   MRN:  [id number]  PCP:  Darreld Mclean, MD    Chief Complaint: No chief complaint on file.   History of Present Illness:  Dawn Reynolds is a 56 y.o. very pleasant female patient who presents with the following:  Patient seen today for physical exam Most recent visit with myself was in November of last year History of hypertension, dyslipidemia, prediabetes, obesity and neuropathy   Recommend COVID-19 booster Recommend Shingrix Flu shot Can update hep C screening with next routine labs  She was seen by my partner Dr. Nani Ravens and also cardiology (Dr. Claiborne Billings) recently in preparation for a right hip replacement Per Dr. Evette Georges note: Recent coronary calcium score of 0, no chest pain or shortness of breath.  Cleared for surgery  Lab Results  Component Value Date   HGBA1C 6.2 09/24/2021  Mammogram 10/23 Colonoscopy completed 2019 She is status post hysterectomy  Amlodipine 10 Aspirin 81 Vitamin D, calcium OTC Cymbalta 60 twice daily Gabapentin 300 3 times daily HCTZ 12.5 Vascepa Prilosec 20 Crestor 20 Patient Active Problem List   Diagnosis Date Noted   S/P TKR (total knee replacement), bilateral 09/27/2018   Bilateral primary osteoarthritis of knee 09/26/2018   Obesity (BMI 35.0-39.9 without comorbidity) 04/06/2018   High triglycerides 04/06/2018   Osteoarthritis of left knee 12/21/2017   Chest pain 07/09/2017   Fibromyalgia 07/09/2017   Hypertension 07/09/2017   Abnormal weight gain 01/31/2014   Empty sella syndrome ([place]) 01/31/2014   Prediabetes 01/31/2014   Neuropathy, peripheral 12/25/2013    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   Chicken pox    Depression    hx of in past situational    Fibromyalgia    GERD (gastroesophageal reflux disease)    Headache    hx of migraines   History of kidney stones    1996   Hypertension     Past Surgical History:  Procedure Laterality Date   ABDOMINAL HYSTERECTOMY     CHOLECYSTECTOMY     03-31-18 [name]   CHOLECYSTECTOMY N/A 03/31/2018   Procedure: LAPAROSCOPIC CHOLECYSTECTOMY;  Surgeon: Ralene Ok, MD;  Location: [facility];  Service: General;  Laterality: N/A;   COLONOSCOPY     endoplantar fasciotomy     GALLBLADDER SURGERY  03/31/2018   KNEE ARTHROSCOPY     left knee 2012   LAPAROSCOPIC CHOLECYSTECTOMY     REPLACEMENT TOTAL KNEE BILATERAL Bilateral 09/27/2018   TONSILLECTOMY     1999   TOTAL KNEE ARTHROPLASTY Bilateral 09/27/2018   Procedure: TOTAL KNEE BILATERAL;  Surgeon: Frederik Pear, MD;  Location: [facility];  Service: Orthopedics;  Laterality: Bilateral;   UPPER GASTROINTESTINAL ENDOSCOPY      Social History   Tobacco Use   Smoking status: Never   Smokeless tobacco: Never  Vaping Use   Vaping Use: Never used  Substance Use Topics   Alcohol use: Yes    Comment: occ   Drug use: No    Family History  Problem Relation Age of Onset   Heart disease Father    Prostate cancer Father    Diabetes Father    Irritable bowel syndrome Father    Kidney disease Father  Mitral valve prolapse Mother    Heart disease Mother    Breast cancer Mother    Pancreatic cancer Maternal Aunt    Liver cancer Paternal Uncle    Colon cancer Paternal Uncle    Esophageal cancer Neg Hx    Stomach cancer Neg Hx    Rectal cancer Neg Hx     Allergies  Allergen Reactions   Aspartame Other (See Comments)    Headaches.   Demerol [Meperidine] Nausea And Vomiting    Medication list has been reviewed and updated.  Current Outpatient Medications on File Prior to Visit  Medication Sig Dispense Refill   ALPRAZolam (XANAX) 0.25 MG tablet Take 1 tablet (0.25 mg total) by mouth 2 (two) times daily as needed for anxiety. 30 tablet 1    amLODipine (NORVASC) 10 MG tablet TAKE 1 TABLET BY MOUTH EVERY DAY 90 tablet 1   aspirin EC 81 MG tablet Take 1 tablet (81 mg total) by mouth 2 (two) times daily. (Patient taking differently: Take 81 mg by mouth 2 (two) times daily. Pt takes once daily at night) 60 tablet 0   Calcium Carb-Cholecalciferol 600-800 MG-UNIT TABS Take 1 tablet by mouth daily.     cholecalciferol (VITAMIN D3) 25 MCG (1000 UT) tablet Take 1,000 Units by mouth daily. Pt takes 5000 units daily     cyclobenzaprine (FLEXERIL) 10 MG tablet Take 1 tablet (10 mg total) by mouth at bedtime as needed for muscle spasms. 30 tablet 2   DULoxetine (CYMBALTA) 60 MG capsule TAKE 1 CAPSULE BY MOUTH TWICE A DAY 180 capsule 1   gabapentin (NEURONTIN) 300 MG capsule Take 1 capsule (300 mg total) by mouth 3 (three) times daily. 270 capsule 1   hydrochlorothiazide (MICROZIDE) 12.5 MG capsule TAKE 1 CAPSULE BY MOUTH EVERY DAY 90 capsule 1   icosapent Ethyl (VASCEPA) 1 g capsule TAKE 2 CAPSULES BY MOUTH 2 TIMES DAILY. 360 capsule 1   meloxicam (MOBIC) 15 MG tablet TAKE 1 TABLET (15 MG TOTAL) BY MOUTH DAILY. 90 tablet 2   nitrofurantoin, macrocrystal-monohydrate, (MACROBID) 100 MG capsule Take 1 capsule (100 mg total) by mouth 2 (two) times daily. Use for 5-7 days as needed for UTI 30 capsule 0   omeprazole (PRILOSEC) 20 MG capsule Take 1 capsule (20 mg total) by mouth daily. 90 capsule 1   rosuvastatin (CRESTOR) 20 MG tablet TAKE 1 TABLET BY MOUTH EVERY DAY 90 tablet 3   traMADol (ULTRAM) 50 MG tablet Take 1 tablet (50 mg total) by mouth every 12 (twelve) hours as needed. 60 tablet 1   vitamin C (ASCORBIC ACID) 500 MG tablet Take 500 mg by mouth daily.     No current facility-administered medications on file prior to visit.    Review of Systems:  As per HPI- otherwise negative.   Physical Examination: There were no vitals filed for this visit. There were no vitals filed for this visit. There is no height or weight on file to calculate  BMI. Ideal Body Weight:    GEN: no acute distress. HEENT: Atraumatic, Normocephalic.  Ears and Nose: No external deformity. CV: RRR, No M/G/R. No JVD. No thrill. No extra heart sounds. PULM: CTA B, no wheezes, crackles, rhonchi. No retractions. No resp. distress. No accessory muscle use. ABD: S, NT, ND, +BS. No rebound. No HSM. EXTR: No c/c/e PSYCH: Normally interactive. Conversant.    Assessment and Plan: *** Physical exam today.  Encouraged healthy diet and exercise routine Will plan further follow- up pending labs.  Signed Lamar Blinks, MD

## 2022-09-26 NOTE — Patient Instructions (Incomplete)
It was good to see again today, I will be in touch with your labs soon as possible. I will get your form faxed it  Best of luck with your hip surgery recovery!   We will check on your K and BNP- it may be helpful to double your HCTZ as needed for the swelling.  When you double the HCTZ you can also take a dose of potassium 20 meq

## 2022-09-27 ENCOUNTER — Encounter: Payer: Self-pay | Admitting: Family Medicine

## 2022-09-27 ENCOUNTER — Ambulatory Visit (INDEPENDENT_AMBULATORY_CARE_PROVIDER_SITE_OTHER): Payer: Managed Care, Other (non HMO) | Admitting: Family Medicine

## 2022-09-27 VITALS — BP 114/80 | HR 70 | Temp 97.9°F | Resp 18 | Ht 65.0 in | Wt 249.2 lb

## 2022-09-27 DIAGNOSIS — R7989 Other specified abnormal findings of blood chemistry: Secondary | ICD-10-CM

## 2022-09-27 DIAGNOSIS — R7303 Prediabetes: Secondary | ICD-10-CM | POA: Diagnosis not present

## 2022-09-27 DIAGNOSIS — E781 Pure hyperglyceridemia: Secondary | ICD-10-CM | POA: Diagnosis not present

## 2022-09-27 DIAGNOSIS — Z0000 Encounter for general adult medical examination without abnormal findings: Secondary | ICD-10-CM

## 2022-09-27 DIAGNOSIS — Z1159 Encounter for screening for other viral diseases: Secondary | ICD-10-CM

## 2022-09-27 DIAGNOSIS — R600 Localized edema: Secondary | ICD-10-CM

## 2022-09-27 DIAGNOSIS — Z130 Encounter for screening for diseases of the blood and blood-forming organs and certain disorders involving the immune mechanism: Secondary | ICD-10-CM

## 2022-09-27 DIAGNOSIS — I10 Essential (primary) hypertension: Secondary | ICD-10-CM

## 2022-09-27 DIAGNOSIS — R5383 Other fatigue: Secondary | ICD-10-CM | POA: Diagnosis not present

## 2022-09-27 DIAGNOSIS — Z5181 Encounter for therapeutic drug level monitoring: Secondary | ICD-10-CM

## 2022-09-27 DIAGNOSIS — M797 Fibromyalgia: Secondary | ICD-10-CM

## 2022-09-27 DIAGNOSIS — K219 Gastro-esophageal reflux disease without esophagitis: Secondary | ICD-10-CM

## 2022-09-27 LAB — CBC
HCT: 33.9 % — ABNORMAL LOW (ref 36.0–46.0)
Hemoglobin: 11.4 g/dL — ABNORMAL LOW (ref 12.0–15.0)
MCHC: 33.6 g/dL (ref 30.0–36.0)
MCV: 88.3 fl (ref 78.0–100.0)
Platelets: 217 10*3/uL (ref 150.0–400.0)
RBC: 3.84 Mil/uL — ABNORMAL LOW (ref 3.87–5.11)
RDW: 15.1 % (ref 11.5–15.5)
WBC: 11.8 10*3/uL — ABNORMAL HIGH (ref 4.0–10.5)

## 2022-09-27 LAB — HEMOGLOBIN A1C: Hgb A1c MFr Bld: 6.3 % (ref 4.6–6.5)

## 2022-09-27 LAB — TSH: TSH: 6.38 u[IU]/mL — ABNORMAL HIGH (ref 0.35–5.50)

## 2022-09-27 LAB — BRAIN NATRIURETIC PEPTIDE: Pro B Natriuretic peptide (BNP): 50 pg/mL (ref 0.0–100.0)

## 2022-09-27 MED ORDER — POTASSIUM CHLORIDE CRYS ER 20 MEQ PO TBCR: 20.0000 meq | EXTENDED_RELEASE_TABLET | Freq: Every day | ORAL | 3 refills | Status: DC

## 2022-09-27 MED ORDER — DULOXETINE HCL 60 MG PO CPEP: 60.0000 mg | ORAL_CAPSULE | Freq: Two times a day (BID) | ORAL | 1 refills | Status: DC

## 2022-09-27 MED ORDER — ROSUVASTATIN CALCIUM 20 MG PO TABS: 20.0000 mg | ORAL_TABLET | Freq: Every day | ORAL | 3 refills | Status: DC

## 2022-09-27 MED ORDER — AMLODIPINE BESYLATE 10 MG PO TABS: 10.0000 mg | ORAL_TABLET | Freq: Every day | ORAL | 3 refills | Status: DC

## 2022-09-27 MED ORDER — HYDROCHLOROTHIAZIDE 12.5 MG PO CAPS: 12.5000 mg | ORAL_CAPSULE | Freq: Every day | ORAL | 3 refills | Status: DC

## 2022-09-27 MED ORDER — ICOSAPENT ETHYL 1 G PO CAPS: ORAL_CAPSULE | ORAL | 1 refills | Status: DC

## 2022-09-28 ENCOUNTER — Encounter: Payer: Self-pay | Admitting: Family Medicine

## 2022-09-28 LAB — COMPREHENSIVE METABOLIC PANEL
ALT: 61 U/L — ABNORMAL HIGH (ref 0–35)
AST: 34 U/L (ref 0–37)
Albumin: 3.6 g/dL (ref 3.5–5.2)
Alkaline Phosphatase: 61 U/L (ref 39–117)
BUN: 16 mg/dL (ref 6–23)
CO2: 28 mEq/L (ref 19–32)
Calcium: 8.2 mg/dL — ABNORMAL LOW (ref 8.4–10.5)
Chloride: 101 mEq/L (ref 96–112)
Creatinine, Ser: 0.63 mg/dL (ref 0.40–1.20)
GFR: 99.28 mL/min (ref >60.00)
Glucose, Bld: 81 mg/dL (ref 70–99)
Potassium: 4 mEq/L (ref 3.5–5.1)
Sodium: 140 mEq/L (ref 135–145)
Total Bilirubin: 1.7 mg/dL — ABNORMAL HIGH (ref 0.2–1.2)
Total Protein: 5.8 g/dL — ABNORMAL LOW (ref 6.0–8.3)

## 2022-09-28 LAB — LIPID PANEL
Cholesterol: 104 mg/dL (ref 0–200)
HDL: 51.7 mg/dL (ref >39.00)
LDL Cholesterol: 25 mg/dL (ref 0–99)
NonHDL: 52.53
Total CHOL/HDL Ratio: 2
Triglycerides: 140 mg/dL (ref 0.0–149.0)
VLDL: 28 mg/dL (ref 0.0–40.0)

## 2022-09-28 LAB — HEPATITIS C ANTIBODY: Hepatitis C Ab: NONREACTIVE

## 2022-09-28 NOTE — Addendum Note (Signed)
Addended by: Lamar Blinks C on: 09/28/2022 12:36 PM   Modules accepted: Orders

## 2022-10-28 ENCOUNTER — Encounter (INDEPENDENT_AMBULATORY_CARE_PROVIDER_SITE_OTHER): Payer: Managed Care, Other (non HMO) | Admitting: Family Medicine

## 2022-10-28 DIAGNOSIS — J019 Acute sinusitis, unspecified: Secondary | ICD-10-CM | POA: Diagnosis not present

## 2022-10-28 MED ORDER — AMOXICILLIN 500 MG PO CAPS: 1000.0000 mg | ORAL_CAPSULE | Freq: Two times a day (BID) | ORAL | 0 refills | Status: DC

## 2022-10-28 NOTE — Telephone Encounter (Signed)
Please see the MyChart message reply(ies) for my assessment and plan.  The patient gave consent for this Medical Advice Message and is aware that it may result in a bill to their insurance company as well as the possibility that this may result in a co-payment or deductible. They are an established patient, but are not seeking medical advice exclusively about a problem treated during an in person or video visit in the last 7 days. I did not recommend an in person or video visit within 7 days of my reply.  I spent a total of 10 minutes cumulative time within 7 days through MyChart messaging Dejanay Wamboldt, MD  

## 2022-11-12 ENCOUNTER — Other Ambulatory Visit: Payer: Self-pay | Admitting: Family Medicine

## 2022-11-12 DIAGNOSIS — M797 Fibromyalgia: Secondary | ICD-10-CM

## 2022-11-15 ENCOUNTER — Other Ambulatory Visit: Payer: Self-pay

## 2022-11-15 DIAGNOSIS — M797 Fibromyalgia: Secondary | ICD-10-CM

## 2022-11-15 MED ORDER — DULOXETINE HCL 60 MG PO CPEP: 60.0000 mg | ORAL_CAPSULE | Freq: Two times a day (BID) | ORAL | 1 refills | Status: DC

## 2022-11-18 ENCOUNTER — Other Ambulatory Visit: Payer: Self-pay

## 2022-11-18 DIAGNOSIS — E781 Pure hyperglyceridemia: Secondary | ICD-10-CM

## 2022-11-18 DIAGNOSIS — I10 Essential (primary) hypertension: Secondary | ICD-10-CM

## 2022-11-18 MED ORDER — OMEPRAZOLE 20 MG PO CPDR: 20.0000 mg | DELAYED_RELEASE_CAPSULE | Freq: Every day | ORAL | 1 refills | Status: DC

## 2022-11-18 MED ORDER — AMLODIPINE BESYLATE 10 MG PO TABS: 10.0000 mg | ORAL_TABLET | Freq: Every day | ORAL | 3 refills | Status: DC

## 2022-11-18 MED ORDER — ROSUVASTATIN CALCIUM 20 MG PO TABS: 20.0000 mg | ORAL_TABLET | Freq: Every day | ORAL | 3 refills | Status: DC

## 2022-11-18 MED ORDER — MELOXICAM 15 MG PO TABS: 15.0000 mg | ORAL_TABLET | Freq: Every day | ORAL | 2 refills | Status: DC

## 2022-11-18 MED ORDER — GABAPENTIN 300 MG PO CAPS: 300.0000 mg | ORAL_CAPSULE | Freq: Three times a day (TID) | ORAL | 1 refills | Status: DC

## 2022-12-29 ENCOUNTER — Other Ambulatory Visit: Payer: Self-pay | Admitting: Family Medicine

## 2022-12-29 DIAGNOSIS — Z5181 Encounter for therapeutic drug level monitoring: Secondary | ICD-10-CM

## 2023-02-08 ENCOUNTER — Other Ambulatory Visit: Payer: Self-pay | Admitting: Orthopedic Surgery

## 2023-02-08 ENCOUNTER — Telehealth: Payer: Self-pay | Admitting: *Deleted

## 2023-02-08 NOTE — Telephone Encounter (Signed)
   Name: Dawn Reynolds  DOB: [birth year]  MRN: [id number]  Primary Cardiologist: Shelva Majestic, MD   Preoperative team, please contact this patient and set up a phone call appointment for further preoperative risk assessment. Please obtain consent and complete medication review. Thank you for your help.  I confirm that guidance regarding antiplatelet and oral anticoagulation therapy has been completed and, if necessary, noted below.  Regarding ASA therapy,  it may be stopped 5-7 days prior to surgery with a plan to resume it as soon as felt to be feasible from a surgical standpoint in the post-operative period.    Mable Fill, Marissa Nestle, NP 02/08/2023, 9:06 AM [place]

## 2023-02-08 NOTE — Telephone Encounter (Signed)
   Pre-operative Risk Assessment    Patient Name: Dawn Reynolds  DOB: November 02, 1966 MRN: [id number]      Request for Surgical Clearance    Procedure:   Left-sided Lumber 3-4 Trans foraminal Lumber Interbody Fusion and decompression with instrumentation and allograft.   Date of Surgery:  Clearance 02/24/23                                 Surgeon:  Dr. Phylliss Bob Surgeon's Group or Practice Name:  [facility] Phone number:  [phone] Fax number:  [phone]   Type of Clearance Requested:   - Medical  - Pharmacy:  Hold Aspirin Not Indicated   Type of Anesthesia:  Not Indicated   Additional requests/questions:    Signed, Greer Ee   02/08/2023, 8:55 AM

## 2023-02-08 NOTE — Telephone Encounter (Signed)
  Patient Consent for Virtual Visit         Dawn Reynolds has provided verbal consent on 02/08/2023 for a virtual visit (video or telephone).   CONSENT FOR VIRTUAL VISIT FOR:  Dawn Reynolds  By participating in this virtual visit I agree to the following:  I hereby voluntarily request, consent and authorize [facility] and its employed or contracted physicians, physician assistants, nurse practitioners or other licensed health care professionals (the Practitioner), to provide me with telemedicine health care services (the "Services") as deemed necessary by the treating Practitioner. I acknowledge and consent to receive the Services by the Practitioner via telemedicine. I understand that the telemedicine visit will involve communicating with the Practitioner through live audiovisual communication technology and the disclosure of certain medical information by electronic transmission. I acknowledge that I have been given the opportunity to request an in-person assessment or other available alternative prior to the telemedicine visit and am voluntarily participating in the telemedicine visit.  I understand that I have the right to withhold or withdraw my consent to the use of telemedicine in the course of my care at any time, without affecting my right to future care or treatment, and that the Practitioner or I may terminate the telemedicine visit at any time. I understand that I have the right to inspect all information obtained and/or recorded in the course of the telemedicine visit and may receive copies of available information for a reasonable fee.  I understand that some of the potential risks of receiving the Services via telemedicine include:  Delay or interruption in medical evaluation due to technological equipment failure or disruption; Information transmitted may not be sufficient (e.g. poor resolution of images) to allow for appropriate medical decision making by the Practitioner;  and/or  In rare instances, security protocols could fail, causing a breach of personal health information.  Furthermore, I acknowledge that it is my responsibility to provide information about my medical history, conditions and care that is complete and accurate to the best of my ability. I acknowledge that Practitioner's advice, recommendations, and/or decision may be based on factors not within their control, such as incomplete or inaccurate data provided by me or distortions of diagnostic images or specimens that may result from electronic transmissions. I understand that the practice of medicine is not an exact science and that Practitioner makes no warranties or guarantees regarding treatment outcomes. I acknowledge that a copy of this consent can be made available to me via my patient portal ([facility]), or I can request a printed copy by calling the office of [facility].    I understand that my insurance will be billed for this visit.   I have read or had this consent read to me. I understand the contents of this consent, which adequately explains the benefits and risks of the Services being provided via telemedicine.  I have been provided ample opportunity to ask questions regarding this consent and the Services and have had my questions answered to my satisfaction. I give my informed consent for the services to be provided through the use of telemedicine in my medical care

## 2023-02-11 ENCOUNTER — Ambulatory Visit: Payer: Managed Care, Other (non HMO) | Attending: Cardiovascular Disease

## 2023-02-11 DIAGNOSIS — Z01810 Encounter for preprocedural cardiovascular examination: Secondary | ICD-10-CM

## 2023-02-11 NOTE — Progress Notes (Signed)
Virtual Visit via Telephone Note   Because of Dawn Reynolds's co-morbid illnesses, she is at least at moderate risk for complications without adequate follow up.  This format is felt to be most appropriate for this patient at this time.  The patient did not have access to video technology/had technical difficulties with video requiring transitioning to audio format only (telephone).  All issues noted in this document were discussed and addressed.  No physical exam could be performed with this format.  Please refer to the patient's chart for her consent to telehealth for [facility].  Evaluation Performed:  Preoperative cardiovascular risk assessment _____________   Date:  02/11/2023   Patient ID:  Dawn Reynolds, DOB 05/23/1966, MRN [id number] Patient Location:  Home Provider location:   Office  Primary Care Provider:  Pearline Cablesopland, Jessica C, MD Primary Cardiologist:  Nicki Guadalajarahomas Kelly, MD  Chief Complaint / Patient Profile   57 y.o. y/o female with a h/o HTN, HLD, OSA, obesity who is pending  and presents today for telephonic preoperative cardiovascular risk assessment.  History of Present Illness    Dawn Reynolds is a 57 y.o. female who presents via audio/video conferencing for a telehealth visit today.  Pt was last seen in cardiology clinic on 08/26/2022 by Dr. Tresa EndoKelly.  At that time Dawn Reynolds was doing well .  The patient is now pending  Left-sided Lumber 3-4 Trans foraminal Lumber Interbody Fusion and decompression with instrumentation and allograft procedure as outlined above. Since her last visit, she continues to be stable from a cardiac standpoint.  Today she denies chest pain, shortness of breath, lower extremity edema, fatigue, palpitations, melena, hematuria, hemoptysis, diaphoresis, weakness, presyncope, syncope, orthopnea, and PND.   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    Chicken pox    Depression    hx of in past situational   Fibromyalgia    GERD (gastroesophageal reflux disease)    Headache    hx of migraines   History of kidney stones    1996   Hypertension    Past Surgical History:  Procedure Laterality Date   ABDOMINAL HYSTERECTOMY     CHOLECYSTECTOMY     03-31-18 Derrell LollingRamirez   CHOLECYSTECTOMY N/A 03/31/2018   Procedure: LAPAROSCOPIC CHOLECYSTECTOMY;  Surgeon: Axel Filleramirez, Armando, MD;  Location: [facility];  Service: General;  Laterality: N/A;   COLONOSCOPY     endoplantar fasciotomy     GALLBLADDER SURGERY  03/31/2018   KNEE ARTHROSCOPY     left knee 2012   LAPAROSCOPIC CHOLECYSTECTOMY     REPLACEMENT TOTAL KNEE BILATERAL Bilateral 09/27/2018   TONSILLECTOMY     1999   TOTAL KNEE ARTHROPLASTY Bilateral 09/27/2018   Procedure: TOTAL KNEE BILATERAL;  Surgeon: Gean Birchwoodowan, Frank, MD;  Location: [facility];  Service: Orthopedics;  Laterality: Bilateral;   UPPER GASTROINTESTINAL ENDOSCOPY      Allergies  Allergies  Allergen Reactions   Aspartame Other (See Comments)    Headaches.   Demerol [Meperidine] Nausea And Vomiting    Home Medications    Prior to Admission medications   Medication Sig Start Date End Date Taking? Authorizing Provider  ALPRAZolam (XANAX) 0.25 MG tablet Take 1 tablet (0.25 mg total) by mouth 2 (two) times daily as needed for anxiety. 04/16/20   Copland, Gwenlyn FoundJessica C, MD  amLODipine (NORVASC) 10 MG tablet Take 1 tablet (10 mg  total) by mouth daily. 11/18/22   Copland, Gwenlyn Found, MD  amoxicillin (AMOXIL) 500 MG tablet Take 2,000 mg by mouth See admin instructions. Take 2000 mg by mouth 2 hours prior to dental procedures.    [provider]  Ascorbic Acid (VITAMIN C) 1000 MG tablet Take 1,000 mg by mouth daily.    [provider]  aspirin EC 81 MG tablet Take 1 tablet (81 mg total) by mouth 2 (two) times daily. Patient taking differently: Take 81 mg by mouth at bedtime. Pt takes once daily at night 09/27/18   Allena Katz, PA-C  Calcium Carb-Cholecalciferol 600-800 MG-UNIT TABS Take 1 tablet by mouth daily.    [provider]  Cholecalciferol (VITAMIN D) 125 MCG (5000 UT) CAPS Take 5,000 Units by mouth daily.    [provider]  cyclobenzaprine (FLEXERIL) 10 MG tablet TAKE 1 TABLET BY MOUTH AT BEDTIME AS NEEDED FOR MUSCLE SPASMS 11/15/22   Copland, Gwenlyn Found, MD  DULoxetine (CYMBALTA) 60 MG capsule Take 1 capsule (60 mg total) by mouth 2 (two) times daily. 11/15/22   Copland, Gwenlyn Found, MD  gabapentin (NEURONTIN) 300 MG capsule Take 1 capsule (300 mg total) by mouth 3 (three) times daily. 11/18/22   Copland, Gwenlyn Found, MD  hydrochlorothiazide (MICROZIDE) 12.5 MG capsule Take 1 capsule (12.5 mg total) by mouth daily. 09/27/22   Copland, Gwenlyn Found, MD  icosapent Ethyl (VASCEPA) 1 g capsule TAKE 2 CAPSULES BY MOUTH 2 TIMES DAILY. 09/27/22   Copland, Gwenlyn Found, MD  loratadine (CLARITIN) 10 MG tablet Take by mouth daily as needed for allergies. 05/14/21   [provider]  meloxicam (MOBIC) 15 MG tablet Take 1 tablet (15 mg total) by mouth daily. 11/18/22   Copland, Gwenlyn Found, MD  nitrofurantoin, macrocrystal-monohydrate, (MACROBID) 100 MG capsule Take 1 capsule (100 mg total) by mouth 2 (two) times daily. Use for 5-7 days as needed for UTI 09/20/22   Copland, Gwenlyn Found, MD  omeprazole (PRILOSEC) 20 MG capsule Take 1 capsule (20 mg total) by mouth daily. 11/18/22   Copland, Gwenlyn Found, MD  ondansetron (ZOFRAN) 4 MG tablet Take 4 mg by mouth every 6 (six) hours as needed for nausea or vomiting. 09/16/22   [provider]  oxyCODONE-acetaminophen (PERCOCET/ROXICET) 5-325 MG tablet Take 1-2 tablets by mouth every 6 (six) hours as needed for severe pain.    [provider]  rosuvastatin (CRESTOR) 20 MG tablet Take 1 tablet (20 mg total) by mouth daily. 11/18/22   Copland, Gwenlyn Found, MD  traMADol (ULTRAM) 50 MG tablet Take 1 tablet (50 mg total) by mouth every 12 (twelve) hours as needed. 01/13/22    Copland, Gwenlyn Found, MD    Physical Exam    Vital Signs:  Dawn Reynolds does not have vital signs available for review today.  Given telephonic nature of communication, physical exam is limited. AAOx3. NAD. Normal affect.  Speech and respirations are unlabored.  Accessory Clinical Findings    None  Assessment & Plan    1.  Preoperative Cardiovascular Risk Assessment:  Left-sided Lumber 3-4 Trans foraminal Lumber Interbody Fusion and decompression with instrumentation and allograft, [facility] , Dr. Estill Bamberg     Primary Cardiologist: Nicki Guadalajara, MD  Chart reviewed as part of pre-operative protocol coverage. Given past medical history and time since last visit, based on ACC/AHA guidelines, Dawn Reynolds would be at acceptable risk for the planned procedure without further cardiovascular testing.   Her RCRI is a class  II risk, 0.9% risk of major cardiac event.  She is able to complete greater than 4 METS of physical activity.  Regarding ASA therapy, it may be stopped 5-7 days prior to surgery with a plan to resume it as soon as felt to be feasible from a surgical standpoint in the post-operative period.   Patient was advised that if she develops new symptoms prior to surgery to contact our office to arrange a follow-up appointment.  She verbalized understanding.  I will route this recommendation to the requesting party via Epic fax function and remove from pre-op pool.       Time:   Today, I have spent 5 minutes with the patient with telehealth technology discussing medical history, symptoms, and management plan.  Prior to her phone evaluation I spent greater than 10 minutes reviewing her past medical history and cardiac medications.   Ronney AstersJesse M Seva Chancy, NP  02/11/2023, 7:56 AM

## 2023-02-12 ENCOUNTER — Other Ambulatory Visit: Payer: Self-pay | Admitting: Family Medicine

## 2023-02-14 NOTE — Pre-Procedure Instructions (Signed)
Surgical Instructions    Your procedure is scheduled on Thursday 02/24/23.   Report to [facility] Main Entrance "A" at 05:30 A.M., then check in with the Admitting office.  Call this number if you have problems the morning of surgery:  [phone]   If you have any questions prior to your surgery date call [phone]: Open Monday-Friday 8am-4pm If you experience any cold or flu symptoms such as cough, fever, chills, shortness of breath, etc. between now and your scheduled surgery, please notify us at the above number     Remember:  Do not eat after midnight the night before your surgery  You may drink clear liquids until 04:30 A.M. the morning of your surgery.   Clear liquids allowed are: Water, Non-Citrus Juices (without pulp), Carbonated Beverages, Clear Tea, Black Coffee ONLY (NO MILK, CREAM OR POWDERED CREAMER of any kind), and Gatorade  Patient Instructions  The night before surgery:  No food after midnight. ONLY clear liquids after midnight  The day of surgery (if you do NOT have diabetes):  Drink ONE (1) Pre-Surgery Clear Ensure by 04:30 A.M. the morning of surgery. Drink in one sitting. Do not sip.  This drink was given to you during your hospital  pre-op appointment visit.  Nothing else to drink after completing the  Pre-Surgery Clear Ensure.         If you have questions, please contact your surgeon's office.     Take these medicines the morning of surgery with A SIP OF WATER:   amLODipine (NORVASC)   DULoxetine (CYMBALTA)   gabapentin (NEURONTIN)  icosapent Ethyl (VASCEPA)  omeprazole (PRILOSEC)  rosuvastatin (CRESTOR)     Take these medicines if needed:   ALPRAZolam Prudy Feeler)   cyclobenzaprine (FLEXERIL)   loratadine (CLARITIN)   ondansetron (ZOFRAN)   oxyCODONE-acetaminophen (PERCOCET/ROXICET)   traMADol (ULTRAM)    As of today, STOP taking any Aspirin (unless otherwise instructed by your surgeon) Aleve, Naproxen, Ibuprofen, Motrin, Advil, Goody's,  BC's, all herbal medications, fish oil, meloxicam (MOBIC), and all vitamins.           Do not wear jewelry or makeup. Do not wear lotions, powders, perfumes/cologne or deodorant. Do not shave 48 hours prior to surgery.  Men may shave face and neck. Do not bring valuables to the hospital. Do not wear nail polish, gel polish, artificial nails, or any other type of covering on natural nails (fingers and toes) If you have artificial nails or gel coating that need to be removed by a nail salon, please have this removed prior to surgery. Artificial nails or gel coating may interfere with anesthesia's ability to adequately monitor your vital signs.  [facility] is not responsible for any belongings or valuables.    Do NOT Smoke (Tobacco/Vaping)  24 hours prior to your procedure  If you use a CPAP at night, you may bring your mask for your overnight stay.   Contacts, glasses, hearing aids, dentures or partials may not be worn into surgery, please bring cases for these belongings   For patients admitted to the hospital, discharge time will be determined by your treatment team.   Patients discharged the day of surgery will not be allowed to drive home, and someone needs to stay with them for 24 hours.   SURGICAL WAITING ROOM VISITATION Patients having surgery or a procedure may have no more than 2 support people in the waiting area - these visitors may rotate.   Children under the age of 37 must have  an adult with them who is not the patient. If the patient needs to stay at the hospital during part of their recovery, the visitor guidelines for inpatient rooms apply. Pre-op nurse will coordinate an appropriate time for 1 support person to accompany patient in pre-op.  This support person may not rotate.   Please refer to [email or web address] for the visitor guidelines for Inpatients (after your surgery is over and you are in a regular room).     Special instructions:    Oral Hygiene is also important to reduce your risk of infection.  Remember - BRUSH YOUR TEETH THE MORNING OF SURGERY WITH YOUR REGULAR TOOTHPASTE   [place]- Preparing For Surgery  Before surgery, you can play an important role. Because skin is not sterile, your skin needs to be as free of germs as possible. You can reduce the number of germs on your skin by washing with CHG (chlorahexidine gluconate) Soap before surgery.  CHG is an antiseptic cleaner which kills germs and bonds with the skin to continue killing germs even after washing.     Please do not use if you have an allergy to CHG or antibacterial soaps. If your skin becomes reddened/irritated stop using the CHG.  Do not shave (including legs and underarms) for at least 48 hours prior to first CHG shower. It is OK to shave your face.  Please follow the instructions from the 5 pre-op CHG bath sheet. It is on a separate piece of paper given to you at your pre-admission appointment.     Wear Clean/Comfortable clothing the morning of surgery Do not apply any deodorants/lotions.   Remember to brush your teeth WITH YOUR REGULAR TOOTHPASTE.    If you received a COVID test during your pre-op visit, it is requested that you wear a mask when out in public, stay away from anyone that may not be feeling well, and notify your surgeon if you develop symptoms. If you have been in contact with anyone that has tested positive in the last 10 days, please notify your surgeon.    Please read over the following fact sheets that you were given.

## 2023-02-15 ENCOUNTER — Other Ambulatory Visit: Payer: Self-pay

## 2023-02-15 ENCOUNTER — Encounter (HOSPITAL_COMMUNITY)
Admission: RE | Admit: 2023-02-15 | Discharge: 2023-02-15 | Disposition: A | Discharge status: Home / Self Care | Payer: Managed Care, Other (non HMO) | Source: Ambulatory Visit | Attending: Orthopedic Surgery | Admitting: Orthopedic Surgery

## 2023-02-15 ENCOUNTER — Encounter (HOSPITAL_COMMUNITY): Payer: Self-pay

## 2023-02-15 VITALS — BP 130/57 | HR 73 | Temp 98.2°F | Resp 20 | Ht 66.0 in | Wt 253.9 lb

## 2023-02-15 DIAGNOSIS — Z01818 Encounter for other preprocedural examination: Secondary | ICD-10-CM

## 2023-02-15 DIAGNOSIS — M5416 Radiculopathy, lumbar region: Secondary | ICD-10-CM | POA: Insufficient documentation

## 2023-02-15 DIAGNOSIS — G4733 Obstructive sleep apnea (adult) (pediatric): Secondary | ICD-10-CM | POA: Diagnosis not present

## 2023-02-15 DIAGNOSIS — Z01812 Encounter for preprocedural laboratory examination: Secondary | ICD-10-CM | POA: Insufficient documentation

## 2023-02-15 DIAGNOSIS — R7303 Prediabetes: Secondary | ICD-10-CM | POA: Insufficient documentation

## 2023-02-15 DIAGNOSIS — I10 Essential (primary) hypertension: Secondary | ICD-10-CM

## 2023-02-15 HISTORY — DX: Prediabetes: R73.03

## 2023-02-15 HISTORY — DX: Sleep apnea, unspecified: G47.30

## 2023-02-15 LAB — CBC
HCT: 45.8 % (ref 36.0–46.0)
Hemoglobin: 15.1 g/dL — ABNORMAL HIGH (ref 12.0–15.0)
MCH: 28 pg (ref 26.0–34.0)
MCHC: 33 g/dL (ref 30.0–36.0)
MCV: 85 fL (ref 80.0–100.0)
Platelets: 278 10*3/uL (ref 150–400)
RBC: 5.39 MIL/uL — ABNORMAL HIGH (ref 3.87–5.11)
RDW: 16.6 % — ABNORMAL HIGH (ref 11.5–15.5)
WBC: 9.7 10*3/uL (ref 4.0–10.5)
nRBC: 0 % (ref 0.0–0.2)

## 2023-02-15 LAB — SURGICAL PCR SCREEN
MRSA, PCR: NEGATIVE
Staphylococcus aureus: NEGATIVE

## 2023-02-15 LAB — TYPE AND SCREEN
ABO/RH(D): B POS
Antibody Screen: NEGATIVE

## 2023-02-15 NOTE — Progress Notes (Signed)
PCP - Shanda Bumps Copland Cardiologist - Nicki Guadalajara  PPM/ICD - denies   Chest x-ray - n/a EKG - 08/26/22 Stress Test - 10 years ago, normal per patient ECHO - 06/04/19 Cardiac Cath - 10 years ago, normal per patient  Sleep Study - +OSA CPAP -does not wear nightly   Stop aspirin 5-7 days prior to surgery  ERAS Protcol -yes PRE-SURGERY Ensure or G2- ensure given  COVID TEST- not needed   Anesthesia review: cardiac clearance 02/24/23  Patient denies shortness of breath, fever, cough and chest pain at PAT appointment   All instructions explained to the patient, with a verbal understanding of the material. Patient agrees to go over the instructions while at home for a better understanding. Patient also instructed to self quarantine after being tested for COVID-19. The opportunity to ask questions was provided.

## 2023-02-16 NOTE — Progress Notes (Addendum)
Anesthesia Chart Review:   Case: [id number] Date/Time: 02/24/23 0715   Procedure: LEFT-SIDED LUMBAR 3- LUMBAR 4 TRANSFORAMINAL LUMBAR INTERBODY FUSION AND DECOMPRESSION WITH INSTRUMENTATION AND ALLOGRAFT (Left)   Anesthesia type: General   Pre-op diagnosis: Substantial left-sided L3 and L4 radiculopathy.  The patient has had ongoing pain and weakness for 3 months now.   [facility] / [facility]   Surgeons: Estill Bamberg, MD       DISCUSSION: Pt is 57 years old with hx HTN, OSA, pre-diabetes, anemia  VS: BP (!) 130/57   Pulse 73   Temp 36.8 C (Oral)   Resp 20   Ht 5\' 6"  (1.676 m)   Wt 115.2 kg   LMP 06/09/2015 (Approximate)   SpO2 99%   BMI 40.98 kg/m   PROVIDERS: - PCP is Copland, Gwenlyn Found, MD - Cardiologist is Nicki Guadalajara, MD. Last office visit 08/26/22. Cleared by Edd Fabian, NP on 02/11/23  LABS: Labs reviewed: Acceptable for surgery. - BMP drawn at PAT hemolyzed, will be obtained day of surgery   (all labs ordered are listed, but only abnormal results are displayed)  Labs Reviewed  CBC - Abnormal; Notable for the following components:      Result Value   RBC 5.39 (*)    Hemoglobin 15.1 (*)    RDW 16.6 (*)    All other components within normal limits  SURGICAL PCR SCREEN  TYPE AND SCREEN    EKG 08/26/22: NSR   CV: CT coronary morphology 07/04/19:  1. Coronary artery calcium score 0 Agatston units. This suggests low risk for future cardiac events. 2.  No significant coronary disease noted.  Echo 06/04/19:  1. The left ventricle has normal systolic function with an ejection  fraction of 60-65%. The cavity size was normal. There is mildly increased  left ventricular wall thickness. Left ventricular diastolic Doppler  parameters are consistent with impaired  relaxation.   2. The right ventricle has normal systolic function. The cavity was  normal.   3. The mitral valve is abnormal. Mild thickening of the mitral valve  leaflet.   4. The aortic  valve is tricuspid. No stenosis of the aortic valve.   5. The aorta is normal in size and structure.   6. Normal LV systolic function; mild diastolic dysfunction; mild LVH.    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   Chicken pox    Depression    hx of in past situational   Fibromyalgia    GERD (gastroesophageal reflux disease)    Headache    hx of migraines   History of kidney stones    1996   Hypertension    Pre-diabetes    Sleep apnea     Past Surgical History:  Procedure Laterality Date   ABDOMINAL HYSTERECTOMY     CHOLECYSTECTOMY     03-31-18 Derrell Lolling   CHOLECYSTECTOMY N/A 03/31/2018   Procedure: LAPAROSCOPIC CHOLECYSTECTOMY;  Surgeon: Axel Filler, MD;  Location: [facility];  Service: General;  Laterality: N/A;   COLONOSCOPY     endoplantar fasciotomy     GALLBLADDER SURGERY  03/31/2018   HIP ARTHROPLASTY Right 2023   KNEE ARTHROSCOPY     left knee 2012   LAPAROSCOPIC CHOLECYSTECTOMY     REPLACEMENT TOTAL KNEE BILATERAL Bilateral 09/27/2018   TONSILLECTOMY     1999   TOTAL KNEE ARTHROPLASTY Bilateral  09/27/2018   Procedure: TOTAL KNEE BILATERAL;  Surgeon: Gean Birchwood, MD;  Location: [facility];  Service: Orthopedics;  Laterality: Bilateral;   UPPER GASTROINTESTINAL ENDOSCOPY      MEDICATIONS:  ALPRAZolam (XANAX) 0.25 MG tablet   amLODipine (NORVASC) 10 MG tablet   amoxicillin (AMOXIL) 500 MG tablet   Ascorbic Acid (VITAMIN C) 1000 MG tablet   aspirin EC 81 MG tablet   Calcium Carb-Cholecalciferol 600-800 MG-UNIT TABS   Cholecalciferol (VITAMIN D) 125 MCG (5000 UT) CAPS   cyclobenzaprine (FLEXERIL) 10 MG tablet   DULoxetine (CYMBALTA) 60 MG capsule   gabapentin (NEURONTIN) 300 MG capsule   hydrochlorothiazide (MICROZIDE) 12.5 MG capsule   icosapent Ethyl (VASCEPA) 1 g capsule   loratadine (CLARITIN) 10 MG tablet   meloxicam (MOBIC) 15 MG tablet   nitrofurantoin, macrocrystal-monohydrate,  (MACROBID) 100 MG capsule   omeprazole (PRILOSEC) 20 MG capsule   ondansetron (ZOFRAN) 4 MG tablet   oxyCODONE-acetaminophen (PERCOCET/ROXICET) 5-325 MG tablet   rosuvastatin (CRESTOR) 20 MG tablet   traMADol (ULTRAM) 50 MG tablet   No current facility-administered medications for this encounter.    If no changes, I anticipate pt can proceed with surgery as scheduled.   Rica Mast, PhD, FNP-BC [facility]/Anesthesiology Phone: [phone] 02/16/2023 1:44 PM

## 2023-02-16 NOTE — Anesthesia Preprocedure Evaluation (Addendum)
Anesthesia Evaluation  Patient identified by MRN, date of birth, ID band Patient awake    Reviewed: Allergy & Precautions, NPO status , Patient's Chart, lab work & pertinent test results  Airway Mallampati: II  TM Distance: >3 FB Neck ROM: Full    Dental  (+) Teeth Intact, Dental Advisory Given   Pulmonary sleep apnea    breath sounds clear to auscultation       Cardiovascular hypertension,  Rhythm:Regular Rate:Normal     Neuro/Psych  Headaches PSYCHIATRIC DISORDERS  Depression     Neuromuscular disease    GI/Hepatic Neg liver ROS,GERD  ,,  Endo/Other  negative endocrine ROS    Renal/GU negative Renal ROS     Musculoskeletal  (+) Arthritis ,  Fibromyalgia -  Abdominal   Peds  Hematology negative hematology ROS (+)   Anesthesia Other Findings   Reproductive/Obstetrics                             Anesthesia Physical Anesthesia Plan  ASA: 2  Anesthesia Plan: General   Post-op Pain Management: Ofirmev IV (intra-op)*   Induction: Intravenous  PONV Risk Score and Plan: 4 or greater and Ondansetron, Dexamethasone, Midazolam and Scopolamine patch - Pre-op  Airway Management Planned: Oral ETT  Additional Equipment: None  Intra-op Plan:   Post-operative Plan: Extubation in OR  Informed Consent: I have reviewed the patients History and Physical, chart, labs and discussed the procedure including the risks, benefits and alternatives for the proposed anesthesia with the patient or authorized representative who has indicated his/her understanding and acceptance.     Dental advisory given  Plan Discussed with: CRNA  Anesthesia Plan Comments: (See APP note by Joslyn Hy, FNP )       Anesthesia Quick Evaluation

## 2023-02-24 ENCOUNTER — Ambulatory Visit (HOSPITAL_COMMUNITY): Payer: Managed Care, Other (non HMO)

## 2023-02-24 ENCOUNTER — Other Ambulatory Visit: Payer: Self-pay

## 2023-02-24 ENCOUNTER — Inpatient Hospital Stay (HOSPITAL_COMMUNITY)
Admission: RE | Disposition: A | Discharge status: Home / Self Care | Payer: Self-pay | Source: Home / Self Care | Attending: Orthopedic Surgery

## 2023-02-24 ENCOUNTER — Inpatient Hospital Stay (HOSPITAL_COMMUNITY)
Admission: RE | Admit: 2023-02-24 | Discharge: 2023-02-26 | DRG: 455 | Disposition: A | Discharge status: Home / Self Care | Payer: Managed Care, Other (non HMO) | Attending: Orthopedic Surgery | Admitting: Orthopedic Surgery

## 2023-02-24 ENCOUNTER — Ambulatory Visit (HOSPITAL_COMMUNITY): Payer: Managed Care, Other (non HMO) | Admitting: Emergency Medicine

## 2023-02-24 ENCOUNTER — Ambulatory Visit (HOSPITAL_BASED_OUTPATIENT_CLINIC_OR_DEPARTMENT_OTHER): Payer: Managed Care, Other (non HMO) | Admitting: Certified Registered Nurse Anesthetist

## 2023-02-24 ENCOUNTER — Encounter (HOSPITAL_COMMUNITY): Payer: Self-pay | Admitting: Orthopedic Surgery

## 2023-02-24 DIAGNOSIS — R339 Retention of urine, unspecified: Secondary | ICD-10-CM | POA: Diagnosis not present

## 2023-02-24 DIAGNOSIS — Z791 Long term (current) use of non-steroidal anti-inflammatories (NSAID): Secondary | ICD-10-CM

## 2023-02-24 DIAGNOSIS — Z79899 Other long term (current) drug therapy: Secondary | ICD-10-CM

## 2023-02-24 DIAGNOSIS — Z800 Family history of malignant neoplasm of digestive organs: Secondary | ICD-10-CM

## 2023-02-24 DIAGNOSIS — Z8042 Family history of malignant neoplasm of prostate: Secondary | ICD-10-CM

## 2023-02-24 DIAGNOSIS — G4730 Sleep apnea, unspecified: Secondary | ICD-10-CM | POA: Diagnosis present

## 2023-02-24 DIAGNOSIS — M5416 Radiculopathy, lumbar region: Secondary | ICD-10-CM | POA: Diagnosis not present

## 2023-02-24 DIAGNOSIS — Z833 Family history of diabetes mellitus: Secondary | ICD-10-CM

## 2023-02-24 DIAGNOSIS — M5116 Intervertebral disc disorders with radiculopathy, lumbar region: Principal | ICD-10-CM | POA: Diagnosis present

## 2023-02-24 DIAGNOSIS — Z888 Allergy status to other drugs, medicaments and biological substances status: Secondary | ICD-10-CM

## 2023-02-24 DIAGNOSIS — M1990 Unspecified osteoarthritis, unspecified site: Secondary | ICD-10-CM | POA: Diagnosis present

## 2023-02-24 DIAGNOSIS — M48061 Spinal stenosis, lumbar region without neurogenic claudication: Secondary | ICD-10-CM | POA: Diagnosis present

## 2023-02-24 DIAGNOSIS — M797 Fibromyalgia: Secondary | ICD-10-CM | POA: Diagnosis present

## 2023-02-24 DIAGNOSIS — Z96641 Presence of right artificial hip joint: Secondary | ICD-10-CM | POA: Diagnosis present

## 2023-02-24 DIAGNOSIS — Z96653 Presence of artificial knee joint, bilateral: Secondary | ICD-10-CM | POA: Diagnosis present

## 2023-02-24 DIAGNOSIS — F32A Depression, unspecified: Secondary | ICD-10-CM | POA: Diagnosis present

## 2023-02-24 DIAGNOSIS — R7303 Prediabetes: Secondary | ICD-10-CM | POA: Diagnosis present

## 2023-02-24 DIAGNOSIS — I10 Essential (primary) hypertension: Secondary | ICD-10-CM

## 2023-02-24 DIAGNOSIS — M62838 Other muscle spasm: Secondary | ICD-10-CM | POA: Diagnosis present

## 2023-02-24 DIAGNOSIS — Z8249 Family history of ischemic heart disease and other diseases of the circulatory system: Secondary | ICD-10-CM

## 2023-02-24 DIAGNOSIS — K219 Gastro-esophageal reflux disease without esophagitis: Secondary | ICD-10-CM | POA: Diagnosis present

## 2023-02-24 DIAGNOSIS — Z87440 Personal history of urinary (tract) infections: Secondary | ICD-10-CM

## 2023-02-24 DIAGNOSIS — Z841 Family history of disorders of kidney and ureter: Secondary | ICD-10-CM

## 2023-02-24 DIAGNOSIS — Z803 Family history of malignant neoplasm of breast: Secondary | ICD-10-CM

## 2023-02-24 DIAGNOSIS — G43909 Migraine, unspecified, not intractable, without status migrainosus: Secondary | ICD-10-CM | POA: Diagnosis present

## 2023-02-24 HISTORY — PX: TRANSFORAMINAL LUMBAR INTERBODY FUSION (TLIF) WITH PEDICLE SCREW FIXATION 1 LEVEL: SHX6141

## 2023-02-24 LAB — BASIC METABOLIC PANEL
Anion gap: 12 (ref 5–15)
BUN: 12 mg/dL (ref 6–20)
CO2: 26 mmol/L (ref 22–32)
Calcium: 9 mg/dL (ref 8.9–10.3)
Chloride: 102 mmol/L (ref 98–111)
Creatinine, Ser: 0.89 mg/dL (ref 0.44–1.00)
GFR, Estimated: >60 mL/min (ref >60)
Glucose, Bld: 111 mg/dL — ABNORMAL HIGH (ref 70–99)
Potassium: 2.8 mmol/L — ABNORMAL LOW (ref 3.5–5.1)
Sodium: 140 mmol/L (ref 135–145)

## 2023-02-24 LAB — ABO/RH: ABO/RH(D): B POS

## 2023-02-24 SURGERY — TRANSFORAMINAL LUMBAR INTERBODY FUSION (TLIF) WITH PEDICLE SCREW FIXATION 1 LEVEL
Anesthesia: General | Laterality: Left

## 2023-02-24 MED ORDER — SCOPOLAMINE 1 MG/3DAYS TD PT72
MEDICATED_PATCH | TRANSDERMAL | Status: DC | PRN
  Administered 2023-02-24: 1 via TRANSDERMAL

## 2023-02-24 MED ORDER — ONDANSETRON HCL 4 MG PO TABS: 4.0000 mg | ORAL_TABLET | Freq: Four times a day (QID) | ORAL | Status: DC | PRN

## 2023-02-24 MED ORDER — HYDROCHLOROTHIAZIDE 12.5 MG PO TABS
12.5000 mg | ORAL_TABLET | Freq: Every day | ORAL | Status: DC
  Administered 2023-02-24 – 2023-02-25 (×2): 12.5 mg via ORAL
  Filled 2023-02-24 (×2): qty 1

## 2023-02-24 MED ORDER — CEFAZOLIN SODIUM-DEXTROSE 2-4 GM/100ML-% IV SOLN
2.0000 g | INTRAVENOUS | Status: AC
  Administered 2023-02-24: 2 g via INTRAVENOUS
  Filled 2023-02-24: qty 100

## 2023-02-24 MED ORDER — BUPIVACAINE LIPOSOME 1.3 % IJ SUSP
INTRAMUSCULAR | Status: AC
  Filled 2023-02-24: qty 20

## 2023-02-24 MED ORDER — CYCLOBENZAPRINE HCL 10 MG PO TABS
10.0000 mg | ORAL_TABLET | Freq: Every day | ORAL | Status: DC
  Administered 2023-02-24: 10 mg via ORAL
  Filled 2023-02-24: qty 1

## 2023-02-24 MED ORDER — FENTANYL CITRATE (PF) 250 MCG/5ML IJ SOLN
INTRAMUSCULAR | Status: AC
  Filled 2023-02-24: qty 5

## 2023-02-24 MED ORDER — ONDANSETRON HCL 4 MG/2ML IJ SOLN: 4.0000 mg | Freq: Four times a day (QID) | INTRAMUSCULAR | Status: DC | PRN

## 2023-02-24 MED ORDER — ACETAMINOPHEN 325 MG PO TABS: 325.0000 mg | ORAL_TABLET | Freq: Once | ORAL | Status: DC | PRN

## 2023-02-24 MED ORDER — ROCURONIUM BROMIDE 10 MG/ML (PF) SYRINGE
PREFILLED_SYRINGE | INTRAVENOUS | Status: DC | PRN
  Administered 2023-02-24 (×2): 50 mg via INTRAVENOUS

## 2023-02-24 MED ORDER — 0.9 % SODIUM CHLORIDE (POUR BTL) OPTIME
TOPICAL | Status: DC | PRN
  Administered 2023-02-24 (×3): 1000 mL

## 2023-02-24 MED ORDER — BUPIVACAINE-EPINEPHRINE (PF) 0.5% -1:200000 IJ SOLN
INTRAMUSCULAR | Status: DC | PRN
  Administered 2023-02-24: 30 mL

## 2023-02-24 MED ORDER — ACETAMINOPHEN 160 MG/5ML PO SOLN: 325.0000 mg | Freq: Once | ORAL | Status: DC | PRN

## 2023-02-24 MED ORDER — THROMBIN 20000 UNITS EX KIT
PACK | CUTANEOUS | Status: AC
  Filled 2023-02-24: qty 1

## 2023-02-24 MED ORDER — GABAPENTIN 300 MG PO CAPS
300.0000 mg | ORAL_CAPSULE | Freq: Three times a day (TID) | ORAL | Status: DC
  Administered 2023-02-24 – 2023-02-25 (×4): 300 mg via ORAL
  Filled 2023-02-24 (×4): qty 1

## 2023-02-24 MED ORDER — MIDAZOLAM HCL 2 MG/2ML IJ SOLN
INTRAMUSCULAR | Status: AC
  Filled 2023-02-24: qty 2

## 2023-02-24 MED ORDER — FLEET ENEMA 7-19 GM/118ML RE ENEM: 1.0000 | ENEMA | Freq: Once | RECTAL | Status: DC | PRN

## 2023-02-24 MED ORDER — VITAMIN D 25 MCG (1000 UNIT) PO TABS: 5000.0000 [IU] | ORAL_TABLET | Freq: Every day | ORAL | Status: DC

## 2023-02-24 MED ORDER — HYDROMORPHONE HCL 1 MG/ML IJ SOLN
INTRAMUSCULAR | Status: AC
  Filled 2023-02-24: qty 1

## 2023-02-24 MED ORDER — OYSTER SHELL CALCIUM/D3 500-5 MG-MCG PO TABS
1.0000 | ORAL_TABLET | Freq: Every day | ORAL | Status: DC
  Administered 2023-02-25: 1 via ORAL
  Filled 2023-02-24: qty 1

## 2023-02-24 MED ORDER — MIDAZOLAM HCL 2 MG/2ML IJ SOLN
INTRAMUSCULAR | Status: DC | PRN
  Administered 2023-02-24: 2 mg via INTRAVENOUS

## 2023-02-24 MED ORDER — DOCUSATE SODIUM 100 MG PO CAPS
100.0000 mg | ORAL_CAPSULE | Freq: Two times a day (BID) | ORAL | Status: DC
  Administered 2023-02-24 – 2023-02-25 (×3): 100 mg via ORAL
  Filled 2023-02-24 (×3): qty 1

## 2023-02-24 MED ORDER — FENTANYL CITRATE (PF) 250 MCG/5ML IJ SOLN
INTRAMUSCULAR | Status: DC | PRN
  Administered 2023-02-24: 100 ug via INTRAVENOUS
  Administered 2023-02-24: 50 ug via INTRAVENOUS

## 2023-02-24 MED ORDER — PROPOFOL 10 MG/ML IV BOLUS
INTRAVENOUS | Status: DC | PRN
  Administered 2023-02-24: 160 mg via INTRAVENOUS

## 2023-02-24 MED ORDER — VITAMIN C 500 MG PO TABS
1000.0000 mg | ORAL_TABLET | Freq: Every day | ORAL | Status: DC
  Administered 2023-02-25: 1000 mg via ORAL
  Filled 2023-02-24: qty 2

## 2023-02-24 MED ORDER — CHLORHEXIDINE GLUCONATE 0.12 % MT SOLN
15.0000 mL | Freq: Once | OROMUCOSAL | Status: AC
  Administered 2023-02-24: 15 mL via OROMUCOSAL
  Filled 2023-02-24: qty 15

## 2023-02-24 MED ORDER — BISACODYL 5 MG PO TBEC: 5.0000 mg | DELAYED_RELEASE_TABLET | Freq: Every day | ORAL | Status: DC | PRN

## 2023-02-24 MED ORDER — THROMBIN 20000 UNITS EX SOLR
CUTANEOUS | Status: DC | PRN
  Administered 2023-02-24: 20000 [IU] via TOPICAL

## 2023-02-24 MED ORDER — POVIDONE-IODINE 7.5 % EX SOLN
Freq: Once | CUTANEOUS | Status: AC
  Filled 2023-02-24: qty 118

## 2023-02-24 MED ORDER — ALPRAZOLAM 0.25 MG PO TABS: 0.2500 mg | ORAL_TABLET | Freq: Two times a day (BID) | ORAL | Status: DC | PRN

## 2023-02-24 MED ORDER — BUPIVACAINE HCL (PF) 0.25 % IJ SOLN
INTRAMUSCULAR | Status: AC
  Filled 2023-02-24: qty 30

## 2023-02-24 MED ORDER — PHENYLEPHRINE HCL-NACL 20-0.9 MG/250ML-% IV SOLN
INTRAVENOUS | Status: DC | PRN
  Administered 2023-02-24: 25 ug/min via INTRAVENOUS

## 2023-02-24 MED ORDER — ALUM & MAG HYDROXIDE-SIMETH 200-200-20 MG/5ML PO SUSP: 30.0000 mL | Freq: Four times a day (QID) | ORAL | Status: DC | PRN

## 2023-02-24 MED ORDER — MINERAL OIL LIGHT 100 % EX OIL
TOPICAL_OIL | CUTANEOUS | Status: DC | PRN
  Administered 2023-02-24: 1 via TOPICAL

## 2023-02-24 MED ORDER — GLYCOPYRROLATE PF 0.2 MG/ML IJ SOSY
PREFILLED_SYRINGE | INTRAMUSCULAR | Status: DC | PRN
  Administered 2023-02-24: .2 mg via INTRAVENOUS

## 2023-02-24 MED ORDER — ACETAMINOPHEN 650 MG RE SUPP: 650.0000 mg | RECTAL | Status: DC | PRN

## 2023-02-24 MED ORDER — PANTOPRAZOLE SODIUM 40 MG PO TBEC
80.0000 mg | DELAYED_RELEASE_TABLET | Freq: Every day | ORAL | Status: DC
  Administered 2023-02-25: 80 mg via ORAL
  Filled 2023-02-24: qty 2

## 2023-02-24 MED ORDER — AMLODIPINE BESYLATE 10 MG PO TABS
10.0000 mg | ORAL_TABLET | Freq: Every day | ORAL | Status: DC
  Administered 2023-02-25: 10 mg via ORAL
  Filled 2023-02-24: qty 1

## 2023-02-24 MED ORDER — OXYCODONE-ACETAMINOPHEN 5-325 MG PO TABS
1.0000 | ORAL_TABLET | Freq: Four times a day (QID) | ORAL | Status: DC | PRN
  Administered 2023-02-24: 1 via ORAL
  Administered 2023-02-24 – 2023-02-25 (×2): 2 via ORAL
  Filled 2023-02-24 (×2): qty 2
  Filled 2023-02-24: qty 1

## 2023-02-24 MED ORDER — HYDROMORPHONE HCL 1 MG/ML IJ SOLN
0.2500 mg | INTRAMUSCULAR | Status: DC | PRN
  Administered 2023-02-24: 0.25 mg via INTRAVENOUS

## 2023-02-24 MED ORDER — ACETAMINOPHEN 325 MG PO TABS
650.0000 mg | ORAL_TABLET | ORAL | Status: DC | PRN
  Administered 2023-02-25: 650 mg via ORAL
  Filled 2023-02-24: qty 2

## 2023-02-24 MED ORDER — STERILE WATER FOR IRRIGATION IR SOLN
Status: DC | PRN
  Administered 2023-02-24: 1000 mL

## 2023-02-24 MED ORDER — ZOLPIDEM TARTRATE 5 MG PO TABS: 5.0000 mg | ORAL_TABLET | Freq: Every evening | ORAL | Status: DC | PRN

## 2023-02-24 MED ORDER — ROSUVASTATIN CALCIUM 20 MG PO TABS
20.0000 mg | ORAL_TABLET | Freq: Every day | ORAL | Status: DC
  Administered 2023-02-24: 20 mg via ORAL
  Filled 2023-02-24 (×2): qty 1

## 2023-02-24 MED ORDER — METHOCARBAMOL 1000 MG/10ML IJ SOLN: 500.0000 mg | Freq: Four times a day (QID) | INTRAVENOUS | Status: DC | PRN

## 2023-02-24 MED ORDER — SODIUM CHLORIDE 0.9% FLUSH: 3.0000 mL | INTRAVENOUS | Status: DC | PRN

## 2023-02-24 MED ORDER — BUPIVACAINE-EPINEPHRINE (PF) 0.5% -1:200000 IJ SOLN
INTRAMUSCULAR | Status: AC
  Filled 2023-02-24: qty 30

## 2023-02-24 MED ORDER — MORPHINE SULFATE (PF) 2 MG/ML IV SOLN
1.0000 mg | INTRAVENOUS | Status: DC | PRN
  Administered 2023-02-25: 2 mg via INTRAVENOUS
  Filled 2023-02-24: qty 1

## 2023-02-24 MED ORDER — MEPERIDINE HCL 25 MG/ML IJ SOLN: 6.2500 mg | INTRAMUSCULAR | Status: DC | PRN

## 2023-02-24 MED ORDER — SODIUM CHLORIDE 0.9 % IV SOLN
250.0000 mL | INTRAVENOUS | Status: DC
  Administered 2023-02-24: 250 mL via INTRAVENOUS

## 2023-02-24 MED ORDER — BUPIVACAINE LIPOSOME 1.3 % IJ SUSP
INTRAMUSCULAR | Status: DC | PRN
  Administered 2023-02-24: 20 mL

## 2023-02-24 MED ORDER — ICOSAPENT ETHYL 1 G PO CAPS
2.0000 g | ORAL_CAPSULE | Freq: Two times a day (BID) | ORAL | Status: DC
  Administered 2023-02-24 – 2023-02-25 (×2): 2 g via ORAL
  Filled 2023-02-24 (×4): qty 2

## 2023-02-24 MED ORDER — POTASSIUM CHLORIDE IN NACL 20-0.9 MEQ/L-% IV SOLN: INTRAVENOUS | Status: DC

## 2023-02-24 MED ORDER — ACETAMINOPHEN 10 MG/ML IV SOLN
INTRAVENOUS | Status: AC
  Filled 2023-02-24: qty 100

## 2023-02-24 MED ORDER — PHENOL 1.4 % MT LIQD: 1.0000 | OROMUCOSAL | Status: DC | PRN

## 2023-02-24 MED ORDER — MENTHOL 3 MG MT LOZG: 1.0000 | LOZENGE | OROMUCOSAL | Status: DC | PRN

## 2023-02-24 MED ORDER — SUGAMMADEX SODIUM 200 MG/2ML IV SOLN
INTRAVENOUS | Status: DC | PRN
  Administered 2023-02-24: 195 mg via INTRAVENOUS

## 2023-02-24 MED ORDER — ONDANSETRON HCL 4 MG/2ML IJ SOLN
INTRAMUSCULAR | Status: DC | PRN
  Administered 2023-02-24: 4 mg via INTRAVENOUS

## 2023-02-24 MED ORDER — AMISULPRIDE (ANTIEMETIC) 5 MG/2ML IV SOLN: 10.0000 mg | Freq: Once | INTRAVENOUS | Status: DC | PRN

## 2023-02-24 MED ORDER — PROPOFOL 500 MG/50ML IV EMUL
INTRAVENOUS | Status: DC | PRN
  Administered 2023-02-24: 75 ug/kg/min via INTRAVENOUS

## 2023-02-24 MED ORDER — DULOXETINE HCL 30 MG PO CPEP
60.0000 mg | ORAL_CAPSULE | Freq: Two times a day (BID) | ORAL | Status: DC
  Administered 2023-02-24 – 2023-02-25 (×2): 60 mg via ORAL
  Filled 2023-02-24 (×2): qty 2

## 2023-02-24 MED ORDER — LACTATED RINGERS IV SOLN: INTRAVENOUS | Status: DC

## 2023-02-24 MED ORDER — SENNOSIDES-DOCUSATE SODIUM 8.6-50 MG PO TABS: 1.0000 | ORAL_TABLET | Freq: Every evening | ORAL | Status: DC | PRN

## 2023-02-24 MED ORDER — LIDOCAINE 2% (20 MG/ML) 5 ML SYRINGE
INTRAMUSCULAR | Status: DC | PRN
  Administered 2023-02-24: 50 mg via INTRAVENOUS

## 2023-02-24 MED ORDER — METHOCARBAMOL 500 MG PO TABS
500.0000 mg | ORAL_TABLET | Freq: Four times a day (QID) | ORAL | Status: DC | PRN
  Administered 2023-02-24: 500 mg via ORAL
  Administered 2023-02-25 (×2): 1000 mg via ORAL
  Administered 2023-02-25 (×2): 500 mg via ORAL
  Filled 2023-02-24 (×2): qty 2
  Filled 2023-02-24 (×3): qty 1

## 2023-02-24 MED ORDER — ACETAMINOPHEN 10 MG/ML IV SOLN
1000.0000 mg | Freq: Once | INTRAVENOUS | Status: DC | PRN
  Administered 2023-02-24: 1000 mg via INTRAVENOUS

## 2023-02-24 MED ORDER — HYDROCODONE-ACETAMINOPHEN 5-325 MG PO TABS
1.0000 | ORAL_TABLET | ORAL | Status: DC | PRN
  Administered 2023-02-25 (×2): 2 via ORAL
  Filled 2023-02-24 (×2): qty 2

## 2023-02-24 MED ORDER — SODIUM CHLORIDE 0.9% FLUSH
3.0000 mL | Freq: Two times a day (BID) | INTRAVENOUS | Status: DC
  Administered 2023-02-24 – 2023-02-25 (×3): 3 mL via INTRAVENOUS

## 2023-02-24 MED ORDER — PROMETHAZINE HCL 25 MG/ML IJ SOLN: 6.2500 mg | INTRAMUSCULAR | Status: DC | PRN

## 2023-02-24 MED ORDER — CEFAZOLIN SODIUM-DEXTROSE 2-4 GM/100ML-% IV SOLN
2.0000 g | Freq: Three times a day (TID) | INTRAVENOUS | Status: AC
  Administered 2023-02-24 (×2): 2 g via INTRAVENOUS
  Filled 2023-02-24 (×2): qty 100

## 2023-02-24 MED ORDER — ORAL CARE MOUTH RINSE: 15.0000 mL | Freq: Once | OROMUCOSAL | Status: AC

## 2023-02-24 SURGICAL SUPPLY — 90 items
AGENT HMST KT MTR STRL THRMB (HEMOSTASIS) ×1
APL SKNCLS STERI-STRIP NONHPOA (GAUZE/BANDAGES/DRESSINGS) ×1
BAG COUNTER SPONGE SURGICOUNT (BAG) ×1 IMPLANT
BAG SPNG CNTER NS LX DISP (BAG) ×1
BENZOIN TINCTURE PRP APPL 2/3 (GAUZE/BANDAGES/DRESSINGS) ×1 IMPLANT
BLADE CLIPPER SURG (BLADE) IMPLANT
BUR PRESCISION 1.7 ELITE (BURR) ×1 IMPLANT
BUR ROUND FLUTED 5 RND (BURR) ×1 IMPLANT
BUR ROUND PRECISION 4.0 (BURR) IMPLANT
BUR SABER RD CUTTING 3.0 (BURR) IMPLANT
CAGE SABLE 10X26 6-12 8D (Cage) IMPLANT
CANNULA GRAFT BNE VG PRE-FILL (Bone Implant) IMPLANT
CNTNR URN SCR LID CUP LEK RST (MISCELLANEOUS) ×1 IMPLANT
CONT SPEC 4OZ STRL OR WHT (MISCELLANEOUS) ×1
COVER BACK TABLE 60X90IN (DRAPES) ×1 IMPLANT
COVER MAYO STAND STRL (DRAPES) ×2 IMPLANT
COVER SURGICAL LIGHT HANDLE (MISCELLANEOUS) ×1 IMPLANT
DISPENSER GRAFT BNE VG (MISCELLANEOUS) IMPLANT
DISPENSER VIVIGEN BONE GRAFT (MISCELLANEOUS) ×1 IMPLANT
DRAIN CHANNEL 15F RND FF W/TCR (WOUND CARE) IMPLANT
DRAPE C-ARM 42X72 X-RAY (DRAPES) ×1 IMPLANT
DRAPE C-ARMOR (DRAPES) IMPLANT
DRAPE POUCH INSTRU U-SHP 10X18 (DRAPES) ×1 IMPLANT
DRAPE SURG 17X23 STRL (DRAPES) ×4 IMPLANT
DURAPREP 26ML APPLICATOR (WOUND CARE) ×1 IMPLANT
ELECT BLADE 4.0 EZ CLEAN MEGAD (MISCELLANEOUS) ×1
ELECT CAUTERY BLADE 6.4 (BLADE) ×1 IMPLANT
ELECT REM PT RETURN 9FT ADLT (ELECTROSURGICAL) ×1
ELECTRODE BLDE 4.0 EZ CLN MEGD (MISCELLANEOUS) ×1 IMPLANT
ELECTRODE REM PT RTRN 9FT ADLT (ELECTROSURGICAL) ×1 IMPLANT
EVACUATOR SILICONE 100CC (DRAIN) IMPLANT
FILTER STRAW FLUID ASPIR (MISCELLANEOUS) ×1 IMPLANT
GAUZE 4X4 16PLY ~~LOC~~+RFID DBL (SPONGE) ×1 IMPLANT
GAUZE SPONGE 4X4 12PLY STRL (GAUZE/BANDAGES/DRESSINGS) ×1 IMPLANT
GLOVE BIO SURGEON STRL SZ 6.5 (GLOVE) ×1 IMPLANT
GLOVE BIO SURGEON STRL SZ8 (GLOVE) ×1 IMPLANT
GLOVE BIOGEL PI IND STRL 7.0 (GLOVE) ×1 IMPLANT
GLOVE BIOGEL PI IND STRL 8 (GLOVE) ×1 IMPLANT
GLOVE SURG ENC MOIS LTX SZ6.5 (GLOVE) ×1 IMPLANT
GOWN STRL REUS W/ TWL LRG LVL3 (GOWN DISPOSABLE) ×2 IMPLANT
GOWN STRL REUS W/ TWL XL LVL3 (GOWN DISPOSABLE) ×1 IMPLANT
GOWN STRL REUS W/TWL LRG LVL3 (GOWN DISPOSABLE) ×2
GOWN STRL REUS W/TWL XL LVL3 (GOWN DISPOSABLE) ×1
GRAFT BONE CANNULA VIVIGEN 3 (Bone Implant) ×1 IMPLANT
IV CATH 14GX2 1/4 (CATHETERS) ×1 IMPLANT
KIT BASIN OR (CUSTOM PROCEDURE TRAY) ×1 IMPLANT
KIT POSITION SURG JACKSON T1 (MISCELLANEOUS) ×1 IMPLANT
KIT TURNOVER KIT B (KITS) ×1 IMPLANT
MARKER SKIN DUAL TIP RULER LAB (MISCELLANEOUS) ×2 IMPLANT
NDL 18GX1X1/2 (RX/OR ONLY) (NEEDLE) ×1 IMPLANT
NDL 22X1.5 STRL (OR ONLY) (MISCELLANEOUS) ×2 IMPLANT
NDL HYPO 25GX1X1/2 BEV (NEEDLE) ×1 IMPLANT
NDL SPNL 18GX3.5 QUINCKE PK (NEEDLE) ×2 IMPLANT
NEEDLE 18GX1X1/2 (RX/OR ONLY) (NEEDLE) IMPLANT
NEEDLE 22X1.5 STRL (OR ONLY) (MISCELLANEOUS) ×1 IMPLANT
NEEDLE HYPO 25GX1X1/2 BEV (NEEDLE) ×1 IMPLANT
NEEDLE SPNL 18GX3.5 QUINCKE PK (NEEDLE) ×2 IMPLANT
NS IRRIG 1000ML POUR BTL (IV SOLUTION) ×1 IMPLANT
PACK LAMINECTOMY ORTHO (CUSTOM PROCEDURE TRAY) ×1 IMPLANT
PACK UNIVERSAL I (CUSTOM PROCEDURE TRAY) ×1 IMPLANT
PAD ARMBOARD 7.5X6 YLW CONV (MISCELLANEOUS) ×2 IMPLANT
PATTIES SURGICAL .5 X1 (DISPOSABLE) ×1 IMPLANT
PATTIES SURGICAL .5X1.5 (GAUZE/BANDAGES/DRESSINGS) ×1 IMPLANT
PUTTY DBX 2.5CC (Putty) ×1 IMPLANT
PUTTY DBX 2.5CC DEPUY (Putty) IMPLANT
ROD PRE BENT EXP 40MM (Rod) IMPLANT
ROD PRE LORDOSED 5.5X45 (Rod) IMPLANT
SCREW SET SINGLE INNER (Screw) IMPLANT
SCREW VIPER CORT FIX 6.00X30 (Screw) IMPLANT
SPONGE INTESTINAL PEANUT (DISPOSABLE) ×1 IMPLANT
SPONGE SURGIFOAM ABS GEL 100 (HEMOSTASIS) ×1 IMPLANT
STRIP CLOSURE SKIN 1/2X4 (GAUZE/BANDAGES/DRESSINGS) ×2 IMPLANT
SURGIFLO W/THROMBIN 8M KIT (HEMOSTASIS) IMPLANT
SUT MNCRL AB 4-0 PS2 18 (SUTURE) ×1 IMPLANT
SUT VIC AB 0 CT1 18XCR BRD 8 (SUTURE) ×1 IMPLANT
SUT VIC AB 0 CT1 8-18 (SUTURE) ×1
SUT VIC AB 1 CT1 18XCR BRD 8 (SUTURE) ×1 IMPLANT
SUT VIC AB 1 CT1 8-18 (SUTURE) ×1
SUT VIC AB 2-0 CT2 18 VCP726D (SUTURE) ×1 IMPLANT
SYR 20ML LL LF (SYRINGE) ×2 IMPLANT
SYR BULB IRRIG 60ML STRL (SYRINGE) ×1 IMPLANT
SYR CONTROL 10ML LL (SYRINGE) ×2 IMPLANT
SYR TB 1ML LUER SLIP (SYRINGE) ×1 IMPLANT
TAP EXPEDIUM DL 4.35 (INSTRUMENTS) IMPLANT
TAP EXPEDIUM DL 5.0 (INSTRUMENTS) IMPLANT
TAP EXPEDIUM DL 6.0 (INSTRUMENTS) IMPLANT
TRAY FOLEY MTR SLVR 16FR STAT (SET/KITS/TRAYS/PACK) ×1 IMPLANT
TUBE FUNNEL GL DISP (ORTHOPEDIC DISPOSABLE SUPPLIES) IMPLANT
WATER STERILE IRR 1000ML POUR (IV SOLUTION) ×1 IMPLANT
YANKAUER SUCT BULB TIP NO VENT (SUCTIONS) ×1 IMPLANT

## 2023-02-24 NOTE — Op Note (Signed)
PATIENT NAME: [facility]   MEDICAL RECORD NO.:   [id number]    DATE OF BIRTH: 26-Jun-1966   DATE OF PROCEDURE: 02/24/2023                               OPERATIVE REPORT     PREOPERATIVE DIAGNOSES: 1. Left-sided lumbar radiculopathy (M54.16) 2. L3-4 stenosis due to a very large extruded and migrated L3-4 disc herniation 3. L3-4 degenerative disc disease, retrolisthesis, and left-sided segmental collapse   POSTOPERATIVE DIAGNOSES: 1. Left-sided lumbar radiculopathy (M54.16) 2. L3-4 stenosis due to a very large extruded and migrated L3-4 disc herniation 3. L3-4 degenerative disc disease, retrolisthesis, and left-sided segmental collapse   PROCEDURES: 1. L3-4 decompression, including removal of very large extruded L3-4 disc fragment 2. Left-sided L3-4 transforaminal lumbar interbody fusion. 3. Right-sided L3-4 posterolateral fusion. 4. Insertion of interbody device x1 (Globus expandable intervertebral spacer). 5. Placement of posterior instrumentation at L3, L4 bilaterally. 6. Use of local autograft. 7. Use of morselized allograft - ViviGen, DBX-mix 8. Intraoperative use of fluoroscopy.   SURGEON:  Estill Bamberg, MD.   ASSISTANTJason Coop, PA-C.   ANESTHESIA:  General endotracheal anesthesia.   COMPLICATIONS:  None.   DISPOSITION:  Stable.   ESTIMATED BLOOD LOSS:   Minimal   INDICATIONS FOR SURGERY:  Briefly, Ms. Dawn Reynolds is a pleasant 57 y.o. -year-old female who did present to me with severe and ongoing pain and weakness in the left leg.  It was very clear that her symptoms were secondary to the prominent findings noted above. The patient failed conservative care and did wish to proceed with the procedure  noted above.   OPERATIVE DETAILS:  On 02/24/2023, the patient was brought to surgery and general endotracheal anesthesia was administered.  The patient was placed prone on a well-padded flat Jackson bed with a spinal frame.  Antibiotics were given and a  time-out procedure was performed. The back was prepped and draped in the usual fashion.  A midline incision was made overlying the L3-4 intervertebral space.  The fascia was incised at the midline.  The paraspinal musculature was bluntly swept laterally.  Anatomic landmarks for the pedicles were exposed. Using fluoroscopy, I did cannulate the L3 and L4 pedicles bilaterally, using a medial to lateral cortical trajectory technique.  On the right side, the posterolateral gutter and right facet joint at L3-4 was decorticated and 6 x 30 mm screws were placed and a 45-mm rod was placed and distraction was applied across the rod on the right side.  On the left side, the cannulated pedicle holes were filled with bone wax.  I then proceeded with the decompressive aspect of the procedure.  On the left side, I did perform a full facetectomy at L3-4.  The dura and traversing left L4 nerve was noted, and clearly was noted to be under compression.  With gentle medial retraction of the left L4 nerve, very large disc fragments were noted inferiorly, behind the L4 vertebral body.  I did work ventral to the dura and traversing left L4 nerve, behind the L4 vertebral body primarily, and removing multiple large disc fragments which clearly were compressive.  Multiple fragments were successfully removed, thereby decompressing the left lateral recess and left L4 nerve.  At this point, I turned my attention to the L3-4 intervertebral disc space.  With ongoing gentle medial retraction of the left L4 nerve, I did perform an annulotomy at the posterolateral aspect  of the L3-4 intervertebral space.  I then used a series of curettes and pituitary rongeurs to perform a thorough and complete intervertebral diskectomy.  The intervertebral space was then liberally packed with autograft as well as allograft in the form of ViviGen and DBX-mix, as was the appropriate-sized intervertebral spacer, which was then expanded to 7 mm in  height.  The spacer was then tamped into position in the usual fashion.  I was very pleased with the press-fit of the spacer.  I then placed 6 x 30 mm screws on the left at L3 and L4.  A 40-mm rod was then placed and caps were placed. The distraction was then released on the contralateral right side.  All 4 caps were then locked.  The wound was copiously irrigated with a total of approximately 3 L prior to placing the bone graft.  Additional autograft and allograft were then packed into the posterolateral gutter on the right side to help aid in the L3-4 fusion.  The wound was explored for any undue bleeding and there was no substantial bleeding encountered.  Gel-Foam was placed over the laminectomy site.  The wound was then closed in layers using #1 Vicryl followed by 2-0 Vicryl, followed by 4-0 Monocryl.  Benzoin and Steri-Strips were applied followed by sterile dressing.  Of note, Jason Coop was my assistant throughout surgery, and did aid in retraction, the decompression, the fusion, suctioning, and closure.     Estill Bamberg, MD

## 2023-02-24 NOTE — Anesthesia Procedure Notes (Signed)
Procedure Name: Intubation Date/Time: 02/24/2023 7:50 AM  Performed by: Darryl Nestle, CRNAPre-anesthesia Checklist: Patient identified, Emergency Drugs available, Suction available and Patient being monitored Patient Re-evaluated:Patient Re-evaluated prior to induction Oxygen Delivery Method: Circle system utilized Preoxygenation: Pre-oxygenation with 100% oxygen Induction Type: IV induction Ventilation: Mask ventilation without difficulty Laryngoscope Size: Mac and 3 Grade View: Grade I Tube type: Oral Tube size: 7.0 mm Number of attempts: 1 Airway Equipment and Method: Stylet and Oral airway Placement Confirmation: ETT inserted through vocal cords under direct vision, positive ETCO2 and breath sounds checked- equal and bilateral Secured at: 22 cm Tube secured with: Tape Dental Injury: Teeth and Oropharynx as per pre-operative assessment

## 2023-02-24 NOTE — Transfer of Care (Signed)
Immediate Anesthesia Transfer of Care Note  Patient: Dawn Reynolds  Procedure(s) Performed: LEFT-SIDED LUMBAR 3- LUMBAR 4 TRANSFORAMINAL LUMBAR INTERBODY FUSION AND DECOMPRESSION WITH INSTRUMENTATION AND ALLOGRAFT (Left)  Patient Location: PACU  Anesthesia Type:General  Level of Consciousness: drowsy  Airway & Oxygen Therapy: Patient Spontanous Breathing and Patient connected to face mask oxygen  Post-op Assessment: Report given to RN and Post -op Vital signs reviewed and stable  Post vital signs: Reviewed and stable  Last Vitals:  Vitals Value Taken Time  BP 101/50 02/24/23 1111  Temp 98   Pulse 69 02/24/23 1114  Resp 14 02/24/23 1114  SpO2 93 % 02/24/23 1114  Vitals shown include unvalidated device data.  Last Pain:  Vitals:   02/24/23 0611  TempSrc: Oral  PainSc: 5          Complications: No notable events documented.

## 2023-02-24 NOTE — H&P (Signed)
PREOPERATIVE H&P  Chief Complaint: Left leg pain and weakness  HPI: Dawn Reynolds is a 57 y.o. 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   Chicken pox    Depression    hx of in past situational   Fibromyalgia    GERD (gastroesophageal reflux disease)    Headache    hx of migraines   History of kidney stones    1996   Hypertension    Pre-diabetes    Sleep apnea    Past Surgical History:  Procedure Laterality Date   ABDOMINAL HYSTERECTOMY     CHOLECYSTECTOMY     03-31-18 Derrell Lolling   CHOLECYSTECTOMY N/A 03/31/2018   Procedure: LAPAROSCOPIC CHOLECYSTECTOMY;  Surgeon: Axel Filler, MD;  Location: [facility];  Service: General;  Laterality: N/A;   COLONOSCOPY     endoplantar fasciotomy     GALLBLADDER SURGERY  03/31/2018   HIP ARTHROPLASTY Right 2023   KNEE ARTHROSCOPY     left knee 2012   LAPAROSCOPIC CHOLECYSTECTOMY     REPLACEMENT TOTAL KNEE BILATERAL Bilateral 09/27/2018   TONSILLECTOMY     1999   TOTAL KNEE ARTHROPLASTY Bilateral 09/27/2018   Procedure: TOTAL KNEE BILATERAL;  Surgeon: Gean Birchwood, MD;  Location: [facility];  Service: Orthopedics;  Laterality: Bilateral;   UPPER GASTROINTESTINAL ENDOSCOPY     Social History   Socioeconomic History   Marital status: Married    Spouse name: Not on file   Number of children: 2   Years of education: Not on file   Highest education level: Not on file  Occupational History   Occupation: Nurse    Employer: [employer]    Comment: CICU nurse  Tobacco Use   Smoking status: Never   Smokeless tobacco: Never   Vaping Use   Vaping Use: Never used  Substance and Sexual Activity   Alcohol use: Yes    Comment: occ   Drug use: No   Sexual activity: Not on file  Other Topics Concern   Not on file  Social History Narrative   Not on file   Social Determinants of Health   Financial Resource Strain: Not on file  Food Insecurity: Not on file  Transportation Needs: Not on file  Physical Activity: Not on file  Stress: Not on file  Social Connections: Not on file   Family History  Problem Relation Age of Onset   Heart disease Father    Prostate cancer Father    Diabetes Father    Irritable bowel syndrome Father    Kidney disease Father    Mitral valve prolapse Mother    Heart disease Mother    Breast cancer Mother    Pancreatic cancer Maternal Aunt    Liver cancer Paternal Uncle    Colon cancer Paternal Uncle    Esophageal cancer Neg Hx    Stomach cancer Neg Hx    Rectal cancer Neg Hx    Allergies  Allergen Reactions   Aspartame Other (See Comments)    Headaches.   Demerol [Meperidine] Nausea  And Vomiting   Prior to Admission medications   Medication Sig Start Date End Date Taking? Authorizing Provider  amLODipine (NORVASC) 10 MG tablet Take 1 tablet (10 mg total) by mouth daily. 11/18/22  Yes Copland, Gwenlyn Found, MD  amoxicillin (AMOXIL) 500 MG tablet Take 2,000 mg by mouth See admin instructions. Take 2000 mg by mouth 2 hours prior to dental procedures.   Yes [provider]  Ascorbic Acid (VITAMIN C) 1000 MG tablet Take 1,000 mg by mouth daily.   Yes [provider]  aspirin EC 81 MG tablet Take 1 tablet (81 mg total) by mouth 2 (two) times daily. Patient taking differently: Take 81 mg by mouth at bedtime. Pt takes once daily at night 09/27/18  Yes Allena Katz, PA-C  Calcium Carb-Cholecalciferol 600-800 MG-UNIT TABS Take 1 tablet by mouth daily.   Yes [provider]  Cholecalciferol (VITAMIN D) 125 MCG (5000 UT) CAPS Take 5,000 Units by mouth daily.    Yes [provider]  cyclobenzaprine (FLEXERIL) 10 MG tablet TAKE 1 TABLET BY MOUTH AT BEDTIME AS NEEDED FOR MUSCLE SPASMS 11/15/22  Yes Copland, Gwenlyn Found, MD  DULoxetine (CYMBALTA) 60 MG capsule Take 1 capsule (60 mg total) by mouth 2 (two) times daily. 11/15/22  Yes Copland, Gwenlyn Found, MD  gabapentin (NEURONTIN) 300 MG capsule Take 1 capsule (300 mg total) by mouth 3 (three) times daily. 11/18/22  Yes Copland, Gwenlyn Found, MD  hydrochlorothiazide (MICROZIDE) 12.5 MG capsule Take 1 capsule (12.5 mg total) by mouth daily. 09/27/22  Yes Copland, Gwenlyn Found, MD  icosapent Ethyl (VASCEPA) 1 g capsule TAKE 2 CAPSULES BY MOUTH 2 TIMES DAILY. 09/27/22  Yes Copland, Gwenlyn Found, MD  meloxicam (MOBIC) 15 MG tablet Take 1 tablet (15 mg total) by mouth daily. 11/18/22  Yes Copland, Gwenlyn Found, MD  nitrofurantoin, macrocrystal-monohydrate, (MACROBID) 100 MG capsule Take 1 capsule (100 mg total) by mouth 2 (two) times daily. Use for 5-7 days as needed for UTI 09/20/22  Yes Copland, Gwenlyn Found, MD  omeprazole (PRILOSEC) 20 MG capsule Take 1 capsule (20 mg total) by mouth daily. 11/18/22  Yes Copland, Gwenlyn Found, MD  ondansetron (ZOFRAN) 4 MG tablet Take 4 mg by mouth every 6 (six) hours as needed for nausea or vomiting. 09/16/22  Yes [provider]  oxyCODONE-acetaminophen (PERCOCET/ROXICET) 5-325 MG tablet Take 1-2 tablets by mouth every 6 (six) hours as needed for severe pain.   Yes [provider]  rosuvastatin (CRESTOR) 20 MG tablet Take 1 tablet (20 mg total) by mouth daily. 11/18/22  Yes Copland, Gwenlyn Found, MD  traMADol (ULTRAM) 50 MG tablet Take 1 tablet (50 mg total) by mouth every 12 (twelve) hours as needed. 01/13/22  Yes Copland, Gwenlyn Found, MD  ALPRAZolam (XANAX) 0.25 MG tablet Take 1 tablet (0.25 mg total) by mouth 2 (two) times daily as needed for anxiety. 04/16/20   Copland, Gwenlyn Found, MD  loratadine (CLARITIN) 10 MG tablet Take by mouth daily as needed for allergies. 05/14/21   [provider]     All other systems have been reviewed and were otherwise negative with the exception of those mentioned in the HPI and as above.  Physical Exam: Vitals:   02/24/23 0611  BP: (!) 136/93  Pulse: 69  Resp: 18  Temp: 98.5 F (36.9 C)  SpO2: 93%    Body mass index is 34.7 kg/m.  General: Alert, no acute distress Cardiovascular: No pedal edema Respiratory: No cyanosis, no use of  accessory musculature Skin: No lesions in the area of chief complaint Neurologic: Sensation intact distally Psychiatric: Patient is competent for consent with normal mood and affect Lymphatic: No axillary or cervical lymphadenopathy   Assessment/Plan: Left-sided L3 and L4 radiculopathy due to large migrated left L3/4 disc herniation. Plan for Procedure(s): LEFT-SIDED LUMBAR 3- LUMBAR 4 TRANSFORAMINAL LUMBAR INTERBODY FUSION AND DECOMPRESSION WITH INSTRUMENTATION AND ALLOGRAFT   Jackelyn Hoehn, MD 02/24/2023 6:34 AM

## 2023-02-25 ENCOUNTER — Encounter (HOSPITAL_COMMUNITY): Payer: Self-pay | Admitting: Orthopedic Surgery

## 2023-02-25 ENCOUNTER — Observation Stay (HOSPITAL_COMMUNITY): Payer: Managed Care, Other (non HMO)

## 2023-02-25 DIAGNOSIS — G43909 Migraine, unspecified, not intractable, without status migrainosus: Secondary | ICD-10-CM | POA: Diagnosis present

## 2023-02-25 DIAGNOSIS — Z87440 Personal history of urinary (tract) infections: Secondary | ICD-10-CM | POA: Diagnosis not present

## 2023-02-25 DIAGNOSIS — M5116 Intervertebral disc disorders with radiculopathy, lumbar region: Secondary | ICD-10-CM | POA: Diagnosis present

## 2023-02-25 DIAGNOSIS — R7303 Prediabetes: Secondary | ICD-10-CM | POA: Diagnosis present

## 2023-02-25 DIAGNOSIS — Z791 Long term (current) use of non-steroidal anti-inflammatories (NSAID): Secondary | ICD-10-CM | POA: Diagnosis not present

## 2023-02-25 DIAGNOSIS — Z79899 Other long term (current) drug therapy: Secondary | ICD-10-CM | POA: Diagnosis not present

## 2023-02-25 DIAGNOSIS — Z800 Family history of malignant neoplasm of digestive organs: Secondary | ICD-10-CM | POA: Diagnosis not present

## 2023-02-25 DIAGNOSIS — Z8042 Family history of malignant neoplasm of prostate: Secondary | ICD-10-CM | POA: Diagnosis not present

## 2023-02-25 DIAGNOSIS — Z96653 Presence of artificial knee joint, bilateral: Secondary | ICD-10-CM | POA: Diagnosis present

## 2023-02-25 DIAGNOSIS — Z803 Family history of malignant neoplasm of breast: Secondary | ICD-10-CM | POA: Diagnosis not present

## 2023-02-25 DIAGNOSIS — F32A Depression, unspecified: Secondary | ICD-10-CM | POA: Diagnosis present

## 2023-02-25 DIAGNOSIS — M1990 Unspecified osteoarthritis, unspecified site: Secondary | ICD-10-CM | POA: Diagnosis present

## 2023-02-25 DIAGNOSIS — Z96641 Presence of right artificial hip joint: Secondary | ICD-10-CM | POA: Diagnosis present

## 2023-02-25 DIAGNOSIS — Z8249 Family history of ischemic heart disease and other diseases of the circulatory system: Secondary | ICD-10-CM | POA: Diagnosis not present

## 2023-02-25 DIAGNOSIS — Z841 Family history of disorders of kidney and ureter: Secondary | ICD-10-CM | POA: Diagnosis not present

## 2023-02-25 DIAGNOSIS — M797 Fibromyalgia: Secondary | ICD-10-CM | POA: Diagnosis present

## 2023-02-25 DIAGNOSIS — Z888 Allergy status to other drugs, medicaments and biological substances status: Secondary | ICD-10-CM | POA: Diagnosis not present

## 2023-02-25 DIAGNOSIS — G4730 Sleep apnea, unspecified: Secondary | ICD-10-CM | POA: Diagnosis present

## 2023-02-25 DIAGNOSIS — M48061 Spinal stenosis, lumbar region without neurogenic claudication: Secondary | ICD-10-CM | POA: Diagnosis present

## 2023-02-25 DIAGNOSIS — M62838 Other muscle spasm: Secondary | ICD-10-CM | POA: Diagnosis present

## 2023-02-25 DIAGNOSIS — I10 Essential (primary) hypertension: Secondary | ICD-10-CM | POA: Diagnosis present

## 2023-02-25 DIAGNOSIS — R339 Retention of urine, unspecified: Secondary | ICD-10-CM | POA: Diagnosis not present

## 2023-02-25 DIAGNOSIS — K219 Gastro-esophageal reflux disease without esophagitis: Secondary | ICD-10-CM | POA: Diagnosis present

## 2023-02-25 DIAGNOSIS — Z833 Family history of diabetes mellitus: Secondary | ICD-10-CM | POA: Diagnosis not present

## 2023-02-25 LAB — URINALYSIS, ROUTINE W REFLEX MICROSCOPIC
Bacteria, UA: NONE SEEN
Bilirubin Urine: NEGATIVE
Glucose, UA: NEGATIVE mg/dL
Ketones, ur: NEGATIVE mg/dL
Leukocytes,Ua: NEGATIVE
Nitrite: NEGATIVE
Protein, ur: NEGATIVE mg/dL
Specific Gravity, Urine: 1.009 (ref 1.005–1.030)
pH: 5 (ref 5.0–8.0)

## 2023-02-25 MED ORDER — TAMSULOSIN HCL 0.4 MG PO CAPS
0.4000 mg | ORAL_CAPSULE | Freq: Every day | ORAL | Status: DC
  Administered 2023-02-25: 0.4 mg via ORAL
  Filled 2023-02-25: qty 1

## 2023-02-25 MED ORDER — TRAMADOL HCL 50 MG PO TABS
100.0000 mg | ORAL_TABLET | Freq: Four times a day (QID) | ORAL | Status: DC | PRN
  Administered 2023-02-25: 100 mg via ORAL
  Filled 2023-02-25: qty 2

## 2023-02-25 MED ORDER — HYDROCODONE-ACETAMINOPHEN 5-325 MG PO TABS: 1.0000 | ORAL_TABLET | Freq: Four times a day (QID) | ORAL | 0 refills | Status: DC | PRN

## 2023-02-25 MED ORDER — LIDOCAINE 5 % EX PTCH
1.0000 | MEDICATED_PATCH | CUTANEOUS | Status: DC
  Administered 2023-02-25: 1 via TRANSDERMAL
  Filled 2023-02-25: qty 1

## 2023-02-25 MED FILL — Thrombin For Soln Kit 20000 Unit: CUTANEOUS | Qty: 1 | Status: AC

## 2023-02-25 NOTE — Progress Notes (Addendum)
Patient voids and still have residual with bladder scan showing there's urine noted in the bladder Foley Catheter placed per order with clear yellow urine return. No c/o pain or discomfort from Patient. Will continue to monitor

## 2023-02-25 NOTE — Progress Notes (Addendum)
Patient alert and oriented, voiding adequately, skin clean, dry and intact without evidence of skin break down, or symptoms of complications - no redness or edema noted, only slight tenderness at site.  Patient states pain is manageable at time of discharge. MD to send medication to the pharmacy. Patient has an appointment with MD in 2 weeks

## 2023-02-25 NOTE — Evaluation (Signed)
Occupational Therapy Evaluation and Discharge Patient Details Name: Dawn Reynolds MRN: [id number] DOB: [birth year] Today's Date: 02/25/2023   History of Present Illness Pt is a 57 y/o female who presents s/p L3-L4 TLIF on 02/24/2023. PMH significant for Chicken pox, fibromyalgia, HA, HTN, pre-diabetes, endoplantar fasciotomy, gall bladder surgery 2019, R THA 2023, B TKA 2019.   Clinical Impression   At baseline PLOF, pt performed UB ADLs Independent/Mod I and LB ADLs Independent to Min assist varying with pain level. Pt participated well in skilled OT eval and now demonstrates ability to complete grooming, UB bathing, and UB dressing standing at sink with Min guard assist with Fair balance while following back precautions. Pt currently requiring Min assist with use of AE for LB bathing and LB dressing sit/stand. Pt requires Min guard for functional transfers and all steps of toileting tasks while following back precautions. OT educated pt in back precautions and compensatory techniques for ADLs and bed mobility with pt verbalizing and demonstrating understanding of all teaching through teach back. 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 Functional level currenlty limited by pain. Husband available to assist pt Independently at home. No equipment needs identified. No additional acute skilled OT services are needed at this time. No post acute OT follow up indicated at this time.      Recommendations for follow up therapy are one component of a multi-disciplinary discharge planning process, led by the attending physician.  Recommendations may be updated based on patient status, additional functional criteria and insurance authorization.   Assistance Recommended at Discharge Intermittent Supervision/Assistance  Patient can return home with the following A little help with walking  and/or transfers;A little help with bathing/dressing/bathroom;Assistance with cooking/housework;Assist for transportation;Help with stairs or ramp for entrance    Functional Status Assessment  Patient has not had a recent decline in their functional status  Equipment Recommendations  None recommended by OT    Recommendations for Other Services       Precautions / Restrictions Precautions Precautions: Fall;Back Precaution Booklet Issued: Yes (comment) Precaution Comments: Reviewed handout and pt was cued for precautions during bed mobility and UB ADLs. Required Braces or Orthoses: Spinal Brace Spinal Brace: Thoracolumbosacral orthotic;Applied in sitting position Restrictions Weight Bearing Restrictions: No      Mobility Bed Mobility Overal bed mobility: Needs Assistance Bed Mobility: Rolling, Sit to Sidelying Rolling: Min guard       Sit to sidelying: Mod assist General bed mobility comments: Pt required increased time and effort secondary to pain.    Transfers Overall transfer level: Needs assistance Equipment used: Rolling walker (2 wheels) Transfers: Sit to/from Stand Sit to Stand: Min guard           General transfer comment: VC's for hand placement on seated surface for safety as pt powered up to full stand. No assist required but hands on guarding provided throughout for safety.      Balance Overall balance assessment: Needs assistance Sitting-balance support: No upper extremity supported, Feet supported Sitting balance-Leahy Scale: Fair Sitting balance - Comments: Pain level affecting pt ability to participate in functional activities in sitting.   Standing balance support: Single extremity supported, During functional activity, No upper extremity supported (in dynamic standing) Standing balance-Leahy Scale: Fair                             ADL either  performed or assessed with clinical judgement   ADL Overall ADL's : Needs  assistance/impaired Eating/Feeding: Independent   Grooming: Wash/dry hands;Wash/dry face;Oral care;Applying deodorant;Brushing hair;Standing;Min guard   Upper Body Bathing: Min guard;Standing (Cueing for back precations)   Lower Body Bathing: Minimal assistance;Sit to/from stand;Adhering to back precautions;With adaptive equipment   Upper Body Dressing : Min guard;Cueing for UE precautions;Sitting;Cueing for compensatory techniques   Lower Body Dressing: Minimal assistance;Sit to/from stand;With adaptive equipment;Cueing for back precautions   Toilet Transfer: Min guard;Ambulation;BSC/3in1;Rolling walker (2 wheels) (BSC/3in1 placed over commode)   Toileting- Clothing Manipulation and Hygiene: Min guard;Cueing for compensatory techniques;Cueing for back precautions;Sit to/from stand       Functional mobility during ADLs: Min guard General ADL Comments: Pt presents with good understanding of precautions and compenstory techniques secondary to prior surgical procedures and career in nursing. Functional level currenlty limited by pain. Husband available to assist pt Independently at home.     Vision Baseline Vision/History: 1 Wears glasses Ability to See in Adequate Light: 0 Adequate Patient Visual Report: No change from baseline       Perception     Praxis      Pertinent Vitals/Pain Pain Assessment Pain Assessment: Faces Faces Pain Scale: Hurts whole lot (4/10 with static sitting/standing/supine, 8/10 with movement sitting/standing/supine) Pain Location: Incision site/low back Pain Descriptors / Indicators: Operative site guarding, Sore, Grimacing, Moaning Pain Intervention(s): Limited activity within patient's tolerance, Monitored during session, Repositioned     Hand Dominance Right   Extremity/Trunk Assessment Upper Extremity Assessment Upper Extremity Assessment: Overall WFL for tasks assessed   Lower Extremity Assessment Lower Extremity Assessment: Defer to PT  evaluation   Cervical / Trunk Assessment Cervical / Trunk Assessment: Back Surgery   Communication Communication Communication: No difficulties   Cognition Arousal/Alertness: Awake/alert Behavior During Therapy: WFL for tasks assessed/performed Overall Cognitive Status: Within Functional Limits for tasks assessed                                       General Comments       Exercises     Shoulder Instructions      Home Living Family/patient expects to be discharged to:: Private residence Living Arrangements: Spouse/significant other Available Help at Discharge: Family;Available PRN/intermittently Type of Home: House Home Access: Stairs to enter   Entrance Stairs-Rails: Right;Left;Can reach both Home Layout: One level     Bathroom Shower/Tub: Producer, television/film/video: Handicapped height Bathroom Accessibility: Yes How Accessible: Accessible via wheelchair;Accessible via walker Home Equipment: Shower seat - built Charity fundraiser (2 wheels);BSC/3in1;Grab bars - tub/shower;Adaptive equipment;Other (comment);Hand held shower head (adjustable bed) Adaptive Equipment: Reacher        Prior Functioning/Environment Prior Level of Function : Independent/Modified Independent             Mobility Comments: Previously Independent ADLs Comments: Previously Independent/Mod I with UB ADLs and Independent to Min assist with LB ADLs varying with pain level.        OT Problem List:        OT Treatment/Interventions:      OT Goals(Current goals can be found in the care plan section) Acute Rehab OT Goals Patient Stated Goal: Pt wants back pain to subside to allow for increased Independence with ADLs, funcitonal transfers, and functional mobility.  OT Frequency:      Co-evaluation  AM-PAC OT "6 Clicks" Daily Activity     Outcome Measure Help from another person eating meals?: None Help from another person taking care of personal  grooming?: A Little Help from another person toileting, which includes using toliet, bedpan, or urinal?: A Little Help from another person bathing (including washing, rinsing, drying)?: A Little Help from another person to put on and taking off regular upper body clothing?: A Little Help from another person to put on and taking off regular lower body clothing?: A Little 6 Click Score: 19   End of Session Equipment Utilized During Treatment: Gait belt;Rolling walker (2 wheels);Back brace Nurse Communication: Mobility status;Other (comment) (Pain level)  Activity Tolerance: Patient limited by pain Patient left: in bed;with call bell/phone within reach;with bed alarm set  OT Visit Diagnosis: Unsteadiness on feet (R26.81);Pain                Time: 1610-9604 OT Time Calculation (min): 40 min Charges:  OT General Charges $OT Visit: 1 Visit OT Evaluation $OT Eval Moderate Complexity: 1 Mod  [address] Coffee StreetMolson Coors Brewing., OTR/L, MA Acute Rehab [phone]   Lendon Colonel 02/25/2023, 12:20 PM

## 2023-02-25 NOTE — Evaluation (Signed)
Physical Therapy Evaluation Patient Details Name: Dawn Reynolds MRN: [id number] DOB: [birth year] Today's Date: 02/25/2023  History of Present Illness  Pt is a 57 y/o female who presents s/p L3-L4 TLIF on 02/24/2023. PMH significant for Chicken pox, fibromyalgia, HA, HTN, pre-diabetes, endoplantar fasciotomy, gall bladder surgery 2019, R THA 2023, B TKA 2019.   Clinical Impression  Pt admitted with above diagnosis. At the time of PT eval, pt was able to demonstrate transfers and ambulation with gross min guard assist and RW for support. Up to mod assist for stair negotiation. Pt was educated on precautions, brace application/wearing schedule, appropriate activity progression, and car transfer. Pt currently with functional limitations due to the deficits listed below (see PT Problem List). Pt will benefit from skilled PT to increase their independence and safety with mobility to allow discharge to the venue listed below.         Recommendations for follow up therapy are one component of a multi-disciplinary discharge planning process, led by the attending physician.  Recommendations may be updated based on patient status, additional functional criteria and insurance authorization.  Follow Up Recommendations       Assistance Recommended at Discharge PRN  Patient can return home with the following  A little help with walking and/or transfers;A little help with bathing/dressing/bathroom;Assistance with cooking/housework;Assist for transportation;Help with stairs or ramp for entrance    Equipment Recommendations None recommended by PT  Recommendations for Other Services       Functional Status Assessment Patient has had a recent decline in their functional status and demonstrates the ability to make significant improvements in function in a reasonable and predictable amount of time.     Precautions / Restrictions Precautions Precautions: Fall;Back Precaution Booklet Issued: Yes  (comment) Precaution Comments: Reviewed handout and pt was cued for precautions during bed mobility and UB ADLs. Required Braces or Orthoses: Spinal Brace Spinal Brace: Thoracolumbosacral orthotic;Applied in sitting position Restrictions Weight Bearing Restrictions: No      Mobility  Bed Mobility Overal bed mobility: Needs Assistance Bed Mobility: Rolling, Sidelying to Sit Rolling: Supervision Sidelying to sit: Supervision       General bed mobility comments: HOB slightly elevated and use of rails required for log roll to full sitting position. Pt required increased time and effort due to pain.    Transfers Overall transfer level: Needs assistance Equipment used: Rolling walker (2 wheels) Transfers: Sit to/from Stand Sit to Stand: Min guard           General transfer comment: VC's for hand placement on seated surface for safety as pt powered up to full stand. No assist required but hands on guarding provided throughout for safety.    Ambulation/Gait Ambulation/Gait assistance: Min guard, Supervision Gait Distance (Feet): 500 Feet Assistive device: Rolling walker (2 wheels) Gait Pattern/deviations: Step-through pattern, Decreased stride length Gait velocity: Decreased Gait velocity interpretation: <1.8 ft/sec, indicate of risk for recurrent falls   General Gait Details: Very slow and guarded due to pain. No assist required. Pt utilizing RW for support throughout for pain control.  Stairs Stairs: Yes Stairs assistance: Mod assist Stair Management: One rail Right, Step to pattern, Forwards (HHA on the L) Number of Stairs: 1 (x2) General stair comments: Increased pain and difficulty powering up to next step. Practiced 1 step up and down for safety.  Wheelchair Mobility    Modified Rankin (Stroke Patients Only)       Balance Overall balance assessment: Needs assistance Sitting-balance support: Feet supported, No upper extremity supported  Sitting balance-Leahy  Scale: Fair     Standing balance support: No upper extremity supported, During functional activity Standing balance-Leahy Scale: Fair                               Pertinent Vitals/Pain Pain Assessment Pain Assessment: Faces Faces Pain Scale: Hurts whole lot Pain Location: Incision site/low back Pain Descriptors / Indicators: Operative site guarding, Sore, Grimacing, Moaning Pain Intervention(s): Limited activity within patient's tolerance, Monitored during session, Repositioned    Home Living Family/patient expects to be discharged to:: Private residence Living Arrangements: Spouse/significant other Available Help at Discharge: Family;Available PRN/intermittently Type of Home: House Home Access: Stairs to enter Entrance Stairs-Rails: Right;Left;Can reach both     Home Layout: One level Home Equipment: Shower seat - built Charity fundraiser (2 wheels);BSC/3in1;Grab bars - tub/shower;Adaptive equipment;Other (comment);Hand held shower head (adjustable bed)      Prior Function Prior Level of Function : Independent/Modified Independent             Mobility Comments: Previously Independent ADLs Comments: Previously Independent/Mod I with UB ADLs and Independent to Min assist with LB ADLs varying with pain level.     Hand Dominance   Dominant Hand: Right    Extremity/Trunk Assessment   Upper Extremity Assessment Upper Extremity Assessment: Overall WFL for tasks assessed    Lower Extremity Assessment Lower Extremity Assessment: Defer to PT evaluation    Cervical / Trunk Assessment Cervical / Trunk Assessment: Back Surgery  Communication   Communication: No difficulties  Cognition Arousal/Alertness: Awake/alert Behavior During Therapy: WFL for tasks assessed/performed Overall Cognitive Status: Within Functional Limits for tasks assessed                                          General Comments      Exercises      Assessment/Plan    PT Assessment Patient needs continued PT services  PT Problem List Decreased strength;Decreased activity tolerance;Decreased mobility;Decreased balance;Decreased knowledge of use of DME;Decreased safety awareness;Decreased knowledge of precautions;Pain       PT Treatment Interventions DME instruction;Gait training;Stair training;Functional mobility training;Therapeutic activities;Therapeutic exercise;Balance training;Patient/family education    PT Goals (Current goals can be found in the Care Plan section)  Acute Rehab PT Goals Patient Stated Goal: Decrease pain PT Goal Formulation: With patient Time For Goal Achievement: 03/04/23 Potential to Achieve Goals: Good    Frequency Min 5X/week     Co-evaluation               AM-PAC PT "6 Clicks" Mobility  Outcome Measure Help needed turning from your back to your side while in a flat bed without using bedrails?: A Little Help needed moving from lying on your back to sitting on the side of a flat bed without using bedrails?: A Little Help needed moving to and from a bed to a chair (including a wheelchair)?: A Little Help needed standing up from a chair using your arms (e.g., wheelchair or bedside chair)?: A Little Help needed to walk in hospital room?: A Little Help needed climbing 3-5 steps with a railing? : A Lot 6 Click Score: 17    End of Session Equipment Utilized During Treatment: Gait belt;Back brace Activity Tolerance: Patient limited by pain Patient left: Other (comment) (In room with OT present) Nurse Communication: Mobility status PT Visit Diagnosis: Unsteadiness  on feet (R26.81);Pain Pain - part of body:  (back)    Time: 6578-4696 PT Time Calculation (min) (ACUTE ONLY): 25 min   Charges:   PT Evaluation $PT Eval Low Complexity: 1 Low PT Treatments $Gait Training: 8-22 mins        Conni Slipper, PT, DPT Acute Rehabilitation Services Secure Chat Preferred Office: [phone]    Marylynn Pearson 02/25/2023, 12:19 PM

## 2023-02-25 NOTE — Progress Notes (Signed)
Physical Therapy Treatment Patient Details Name: Dawn Reynolds MRN: [id number] DOB: 1965/11/16 Today's Date: 02/25/2023   History of Present Illness Pt is a 57 y/o female who presents s/p L3-L4 TLIF on 02/24/2023. PMH significant for Chicken pox, fibromyalgia, HA, HTN, pre-diabetes, endoplantar fasciotomy, gall bladder surgery 2019, R THA 2023, B TKA 2019.    PT Comments    Pt called department and left message for PT to return to room to answer questions. Pt with many questions regarding entering bed in quadruped and being in a prone position. Explained that the log roll technique is the safest way to enter/exit bed at this time, and it is not recommended to enter bed in quadruped or lay prone at this time in order to maintain optimal spinal alignment. PT and pt talked through positioning recommendations in bed and in the recliner, and PT assisted pt to reposition in bed with pillow between knees for comfort. Recommendations remain appropriate at this time. Will continue to follow.    Recommendations for follow up therapy are one component of a multi-disciplinary discharge planning process, led by the attending physician.  Recommendations may be updated based on patient status, additional functional criteria and insurance authorization.  Follow Up Recommendations       Assistance Recommended at Discharge PRN  Patient can return home with the following A little help with walking and/or transfers;A little help with bathing/dressing/bathroom;Assistance with cooking/housework;Assist for transportation;Help with stairs or ramp for entrance   Equipment Recommendations  None recommended by PT    Recommendations for Other Services       Precautions / Restrictions Precautions Precautions: Fall;Back Precaution Booklet Issued: Yes (comment) Precaution Comments: Reviewed precautions during functional mobility Required Braces or Orthoses: Spinal Brace Spinal Brace: Thoracolumbosacral  orthotic;Applied in sitting position Restrictions Weight Bearing Restrictions: No     Mobility  Bed Mobility Overal bed mobility: Needs Assistance Bed Mobility: Rolling, Sidelying to Sit Rolling: Supervision Sidelying to sit: Supervision       General bed mobility comments: HOB slightly elevated and use of rails required for log roll to full sitting position. Pt required increased time and effort due to pain.    Transfers Overall transfer level: Needs assistance Equipment used: Rolling walker (2 wheels) Transfers: Sit to/from Stand Sit to Stand: Min guard           General transfer comment: VC's for hand placement on seated surface for safety as pt powered up to full stand. No assist required but hands on guarding provided throughout for safety.    Ambulation/Gait Ambulation/Gait assistance: Min guard, Supervision Gait Distance (Feet): 500 Feet Assistive device: Rolling walker (2 wheels) Gait Pattern/deviations: Step-through pattern, Decreased stride length Gait velocity: Decreased Gait velocity interpretation: <1.8 ft/sec, indicate of risk for recurrent falls   General Gait Details: Very slow and guarded due to pain. No assist required. Pt utilizing RW for support throughout for pain control.   Stairs Stairs: Yes Stairs assistance: Mod assist Stair Management: One rail Right, Step to pattern, Forwards (HHA on the L) Number of Stairs: 1 (x2) General stair comments: Increased pain and difficulty powering up to next step. Practiced 1 step up and down for safety.   Wheelchair Mobility    Modified Rankin (Stroke Patients Only)       Balance Overall balance assessment: Needs assistance Sitting-balance support: Feet supported, No upper extremity supported Sitting balance-Leahy Scale: Fair     Standing balance support: No upper extremity supported, During functional activity Standing balance-Leahy Scale: Fair  Cognition Arousal/Alertness: Awake/alert Behavior During Therapy: WFL for tasks assessed/performed Overall Cognitive Status: Within Functional Limits for tasks assessed                                          Exercises      General Comments General comments (skin integrity, edema, etc.): Repositioned in bed in sidelying with pillow between knees for comfort      Pertinent Vitals/Pain Pain Assessment Pain Assessment: Faces Faces Pain Scale: Hurts little more Pain Location: low back, at rest in bed Pain Descriptors / Indicators: Operative site guarding, Sore Pain Intervention(s): Limited activity within patient's tolerance, Monitored during session, Repositioned    Home Living Family/patient expects to be discharged to:: Private residence Living Arrangements: Spouse/significant other Available Help at Discharge: Family;Available PRN/intermittently Type of Home: House Home Access: Stairs to enter Entrance Stairs-Rails: Right;Left;Can reach both     Home Layout: One level Home Equipment: Shower seat - built Charity fundraiser (2 wheels);BSC/3in1;Grab bars - tub/shower;Adaptive equipment;Other (comment);Hand held shower head (adjustable bed)      Prior Function            PT Goals (current goals can now be found in the care plan section) Acute Rehab PT Goals Patient Stated Goal: Decrease pain, return home PT Goal Formulation: With patient Time For Goal Achievement: 03/04/23 Potential to Achieve Goals: Good Progress towards PT goals: Progressing toward goals    Frequency    Min 5X/week      PT Plan Current plan remains appropriate    Co-evaluation              AM-PAC PT "6 Clicks" Mobility   Outcome Measure  Help needed turning from your back to your side while in a flat bed without using bedrails?: A Little Help needed moving from lying on your back to sitting on the side of a flat bed without using bedrails?: A Little Help needed  moving to and from a bed to a chair (including a wheelchair)?: A Little Help needed standing up from a chair using your arms (e.g., wheelchair or bedside chair)?: A Little Help needed to walk in hospital room?: A Little Help needed climbing 3-5 steps with a railing? : A Little 6 Click Score: 18    End of Session Equipment Utilized During Treatment: Gait belt;Back brace Activity Tolerance: Patient limited by pain Patient left: in bed;with call bell/phone within reach Nurse Communication: Mobility status (Pt asking about Lidocaine patch, asking for dressing to be changed) PT Visit Diagnosis: Unsteadiness on feet (R26.81);Pain Pain - part of body:  (back)     Time: 2536-6440 PT Time Calculation (min) (ACUTE ONLY): 13 min  Charges:  $Gait Training: 8-22 mins $Self Care/Home Management: 8-22                     Conni Slipper, PT, DPT Acute Rehabilitation Services Secure Chat Preferred Office: [phone]    Marylynn Pearson 02/25/2023, 1:50 PM

## 2023-02-25 NOTE — Anesthesia Postprocedure Evaluation (Signed)
Anesthesia Post Note  Patient: Dawn Reynolds  Procedure(s) Performed: LEFT-SIDED LUMBAR 3- LUMBAR 4 TRANSFORAMINAL LUMBAR INTERBODY FUSION AND DECOMPRESSION WITH INSTRUMENTATION AND ALLOGRAFT (Left)     Patient location during evaluation: PACU Anesthesia Type: General Level of consciousness: awake and alert Pain management: pain level controlled Vital Signs Assessment: post-procedure vital signs reviewed and stable Respiratory status: spontaneous breathing, nonlabored ventilation, respiratory function stable and patient connected to nasal cannula oxygen Cardiovascular status: blood pressure returned to baseline and stable Postop Assessment: no apparent nausea or vomiting Anesthetic complications: no   No notable events documented.              Shelton Silvas

## 2023-02-25 NOTE — Progress Notes (Signed)
Xray results of the spine were called to Dr. Yevette Edwards, no new orders. Rema Fendt, RN

## 2023-02-25 NOTE — Discharge Instructions (Signed)
If you have not heard from [facility] on discharge, please call the office at [phone] to set up follow-up appointment for catheter removal.

## 2023-02-25 NOTE — Progress Notes (Signed)
    Patient doing well  Patient denies leg pain Has not been able to void + LBP   Physical Exam: Vitals:   02/25/23 0438 02/25/23 0751  BP: 119/66 (!) 131/59  Pulse: 65 73  Resp: 20 20  Temp: 98.4 F (36.9 C) 99.5 F (37.5 C)  SpO2: 93% 94%    Dressing in place NVI, including full strength and sensation throughout lower extremities  POD #1 s/p L3/4 decompression and fusion, doing well with inability to void  - up with PT/OT, encourage ambulation - Ultram for pain, Robaxin for muscle spasms - may d/c home today, but need to follow bladder, which is likely related to anesthesia. Patient states she has voiding difficulties prior to surgery, and I suspect anesthesia has exacerbated this. She was given flomax this AM. Will attempt another voiding trial later this AM. May need to consult urology later today if not improved.

## 2023-02-25 NOTE — Consult Note (Cosign Needed Addendum)
Urology Consult   Physician requesting consult: Dr. Yevette Edwards, MD  Reason for consult: Urinary retention  History of Present Illness: Dawn Reynolds is a 57 y.o. 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 Yevette Edwards from the orthopedics team on 02/24/2023.  Postoperatively, she developed urinary retention and required multiple in and out caths overnight by the RN staff.  Residuals got as high as 1 L overnight.  A catheter was subsequently placed by the RN staff today due to failed trial of void.  Urology was consulted for management of urinary retention.  The catheter is draining clear yellow urine and urine output is adequate.  She was started on tamsulosin by the primary team.  The patient reports that she did have voiding symptoms prior to her surgery.  She notes that over the last month or so, she has started voiding less in volumes were smaller.  She also reports a history of recurrent UTIs and was eventually started on self start antibiotics (Macrobid) by her primary care physician, which she has not taken in some time.  She reports that she has never seen a urologist before.  Denies any recent history of hematuria or stones.   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   Chicken pox    Depression    hx of in past situational   Fibromyalgia    GERD (gastroesophageal reflux disease)    Headache    hx of migraines   History of kidney stones    1996   Hypertension    Pre-diabetes    Sleep apnea     Past Surgical History:  Procedure Laterality Date   ABDOMINAL HYSTERECTOMY     CHOLECYSTECTOMY     03-31-18 Derrell Lolling   CHOLECYSTECTOMY N/A 03/31/2018   Procedure: LAPAROSCOPIC CHOLECYSTECTOMY;  Surgeon: Axel Filler, MD;  Location: [facility];  Service: General;  Laterality: N/A;   COLONOSCOPY     endoplantar fasciotomy     GALLBLADDER SURGERY  03/31/2018   HIP ARTHROPLASTY Right 2023   KNEE ARTHROSCOPY     left knee 2012   LAPAROSCOPIC CHOLECYSTECTOMY     REPLACEMENT TOTAL KNEE BILATERAL Bilateral 09/27/2018   TONSILLECTOMY     1999   TOTAL KNEE ARTHROPLASTY Bilateral 09/27/2018   Procedure: TOTAL KNEE BILATERAL;  Surgeon: Gean Birchwood, MD;  Location: [facility];  Service: Orthopedics;  Laterality: Bilateral;   TRANSFORAMINAL LUMBAR INTERBODY FUSION (TLIF) WITH PEDICLE SCREW FIXATION 1 LEVEL Left 02/24/2023   Procedure: LEFT-SIDED LUMBAR 3- LUMBAR 4 TRANSFORAMINAL LUMBAR INTERBODY FUSION AND DECOMPRESSION WITH INSTRUMENTATION AND ALLOGRAFT;  Surgeon: Estill Bamberg, MD;  Location: [facility];  Service: Orthopedics;  Laterality: Left;   UPPER GASTROINTESTINAL ENDOSCOPY      Current Hospital Medications:  Home Meds:  Current Meds  Medication Sig   amLODipine (NORVASC) 10 MG tablet Take 1 tablet (10 mg total) by mouth daily.   amoxicillin (AMOXIL) 500 MG tablet Take 2,000 mg by mouth See admin instructions. Take 2000 mg by mouth 2 hours prior to dental procedures.   Ascorbic Acid (VITAMIN C) 1000 MG tablet Take 1,000 mg by mouth daily.   aspirin EC 81 MG tablet Take 1 tablet (81 mg total) by mouth 2 (two) times daily. (Patient  taking differently: Take 81 mg by mouth at bedtime. Pt takes once daily at night)   Calcium Carb-Cholecalciferol 600-800 MG-UNIT TABS Take 1 tablet by mouth daily.   Cholecalciferol (VITAMIN D) 125 MCG (5000 UT) CAPS Take 5,000 Units by mouth daily.   cyclobenzaprine (FLEXERIL) 10 MG tablet TAKE 1 TABLET BY MOUTH AT BEDTIME AS NEEDED FOR MUSCLE SPASMS   DULoxetine (CYMBALTA) 60 MG capsule Take 1 capsule (60 mg total) by mouth 2 (two) times daily.   gabapentin (NEURONTIN) 300 MG capsule Take 1 capsule (300 mg total) by mouth 3 (three) times daily.   hydrochlorothiazide (MICROZIDE)  12.5 MG capsule Take 1 capsule (12.5 mg total) by mouth daily.   icosapent Ethyl (VASCEPA) 1 g capsule TAKE 2 CAPSULES BY MOUTH 2 TIMES DAILY.   nitrofurantoin, macrocrystal-monohydrate, (MACROBID) 100 MG capsule Take 1 capsule (100 mg total) by mouth 2 (two) times daily. Use for 5-7 days as needed for UTI   omeprazole (PRILOSEC) 20 MG capsule Take 1 capsule (20 mg total) by mouth daily.   ondansetron (ZOFRAN) 4 MG tablet Take 4 mg by mouth every 6 (six) hours as needed for nausea or vomiting.   oxyCODONE-acetaminophen (PERCOCET/ROXICET) 5-325 MG tablet Take 1-2 tablets by mouth every 6 (six) hours as needed for severe pain.   rosuvastatin (CRESTOR) 20 MG tablet Take 1 tablet (20 mg total) by mouth daily.   traMADol (ULTRAM) 50 MG tablet Take 1 tablet (50 mg total) by mouth every 12 (twelve) hours as needed.   [DISCONTINUED] meloxicam (MOBIC) 15 MG tablet Take 1 tablet (15 mg total) by mouth daily.    Scheduled Meds:  amLODipine  10 mg Oral Daily   vitamin C  1,000 mg Oral Daily   calcium-vitamin D  1 tablet Oral Daily   cholecalciferol  5,000 Units Oral Daily   cyclobenzaprine  10 mg Oral QHS   docusate sodium  100 mg Oral BID   DULoxetine  60 mg Oral BID   gabapentin  300 mg Oral TID   hydrochlorothiazide  12.5 mg Oral Daily   icosapent Ethyl  2 g Oral BID   lidocaine  1 patch Transdermal Q24H   pantoprazole  80 mg Oral Daily   rosuvastatin  20 mg Oral Daily   sodium chloride flush  3 mL Intravenous Q12H   tamsulosin  0.4 mg Oral Daily   Continuous Infusions:  sodium chloride 250 mL (02/24/23 1514)   0.9 % NaCl with KCl 20 mEq / L     methocarbamol (ROBAXIN) IV     PRN Meds:.acetaminophen **OR** acetaminophen, ALPRAZolam, alum & mag hydroxide-simeth, bisacodyl, HYDROcodone-acetaminophen, menthol-cetylpyridinium **OR** phenol, methocarbamol **OR** methocarbamol (ROBAXIN) IV, morphine injection, ondansetron **OR** ondansetron (ZOFRAN) IV, ondansetron, senna-docusate, sodium chloride  flush, sodium phosphate, traMADol, zolpidem  Allergies:  Allergies  Allergen Reactions   Aspartame Other (See Comments)    Headaches.   Demerol [Meperidine] Nausea And Vomiting    Family History  Problem Relation Age of Onset   Heart disease Father    Prostate cancer Father    Diabetes Father    Irritable bowel syndrome Father    Kidney disease Father    Mitral valve prolapse Mother    Heart disease Mother    Breast cancer Mother    Pancreatic cancer Maternal Aunt    Liver cancer Paternal Uncle    Colon cancer Paternal Uncle    Esophageal cancer Neg Hx    Stomach cancer Neg Hx    Rectal cancer Neg Hx  Social History:  reports that she has never smoked. She has never used smokeless tobacco. She reports current alcohol use. She reports that she does not use drugs.  ROS: A complete review of systems was performed.  All systems are negative except for pertinent findings as noted.  Physical Exam:  Vital signs in last 24 hours: Temp:  [98.2 F (36.8 C)-99.8 F (37.7 C)] 98.4 F (36.9 C) (04/19 1635) Pulse Rate:  [65-74] 72 (04/19 1635) Resp:  [20] 20 (04/19 1635) BP: (96-131)/(44-66) 111/56 (04/19 1635) SpO2:  [91 %-96 %] 94 % (04/19 1635) Constitutional:  Alert and oriented, No acute distress Cardiovascular: Regular rate  Respiratory: Normal respiratory effort GU: Foley catheter in place to gravity drainage, clear yellow urine in catheter tubing Psychiatric: Normal mood and affect  Laboratory Data:  No results for input(s): "WBC", "HGB", "HCT", "PLT" in the last 72 hours.  Recent Labs    02/24/23 0630  NA 140  K 2.8*  CL 102  GLUCOSE 111*  BUN 12  CALCIUM 9.0  CREATININE 0.89     No results found for this or any previous visit (from the past 24 hour(s)). No results found for this or any previous visit (from the past 240 hour(s)).  Renal Function: Recent Labs    02/24/23 0630  CREATININE 0.89   Estimated Creatinine Clearance: 83.1 mL/min (by C-G  formula based on SCr of 0.89 mg/dL).  Radiologic Imaging: DG Lumbar Spine 2-3 Views  Result Date: 02/25/2023 CLINICAL DATA:  Severe back pain after lumbar spinal fusion EXAM: LUMBAR SPINE - 2VIEW COMPARISON:  Lumbar spine radiograph 02/24/23 FINDINGS: Postsurgical changes from L3-L4 posterior spinal fusion and disc spacer placement. No evidence of hardware complications. Vertebral body heights are maintained. There are markedly dilated loops of small bowel in the central abdomen. Findings are worrisome for either an ileus or small-bowel obstruction. The transverse descending colon normal in caliber Partially visualized right hip arthroplasty. IMPRESSION: Markedly dilated loops of small bowel in the central abdomen. Findings are worrisome for either an ileus or small-bowel obstruction. Recommend further evaluation with a CT of the abdomen and pelvis. These results will be called to the ordering clinician or representative by the Radiologist Assistant, and communication documented in the PACS or [employer]. Electronically Signed   By: Lorenza Cambridge M.D.   On: 02/25/2023 16:31   DG Lumbar Spine 2-3 Views  Result Date: 02/24/2023 CLINICAL DATA:  Fluoro guidance provided EXAM: LUMBAR SPINE - 2-3 VIEW FINDINGS: Dose: 42.0 mGy Fluoro time 43s IMPRESSION: C-arm fluoro guidance provided. Electronically Signed   By: Layla Maw M.D.   On: 02/24/2023 11:23   DG C-Arm 1-60 Min-No Report  Result Date: 02/24/2023 Fluoroscopy was utilized by the requesting physician.  No radiographic interpretation.   DG C-Arm 1-60 Min-No Report  Result Date: 02/24/2023 Fluoroscopy was utilized by the requesting physician.  No radiographic interpretation.   DG C-Arm 1-60 Min-No Report  Result Date: 02/24/2023 Fluoroscopy was utilized by the requesting physician.  No radiographic interpretation.   DG Lumbar Spine 1 View  Result Date: 02/24/2023 CLINICAL DATA:  [id number] Elective surgery [id number] EXAM: LUMBAR SPINE - 1  VIEW COMPARISON:  None Available. FINDINGS: Intraoperative lateral radiograph localization. Bracketed needles are at the level of L3-L4. IMPRESSION: Intraoperative localization at L3-L4. Electronically Signed   By: Caprice Renshaw M.D.   On: 02/24/2023 10:07    I independently reviewed the above imaging studies.  Impression/Recommendation 57 year old female with a history of recurrent urinary tract infections  status post L3-L4 decompression with vertebral fusion on 02/24/2023.  Urology consulted for management of postoperative urinary retention.  Patient currently afebrile and hemodynamically stable.  Initially, urinary retention was managed with in and out cathing per nursing with residuals as high as 1 L.  Urinary catheter placed today after failed trial of void.  Catheter is currently draining clear yellow urine and urine output is adequate.  Discussed findings with patient and husband at bedside.  Due to concern of bladder stretch injury during her trial of void, would recommend keeping the catheter to gravity drainage for bladder rest for at least 10 to 14 days.  Okay to continue tamsulosin at this time.  Patient is not currently followed by urologist.  We will have the patient follow-up with [facility] for trial of void.  I have placed our contact information in her discharge paperwork.  Please page the urology pager with any questions or concerns.  Neila Gear, MD [facility] 02/25/2023, 8:03 PM

## 2023-02-25 NOTE — Progress Notes (Addendum)
I did get updated AP and lateral views of the patient's lumbar spine, which have since been obtained.  These do reveal appropriate hardwarepositioning, including appropriate positioning of the patient's intervertebral spacer and bilateral pedicle screws.  The radiologist does report dilated loops of bowel.  This will be followed clinically.  With regards to the patient's ongoing urinary retention, she was able to spontaneously void 250 cc, and per her nurse, 400 cc of residual urine was noted in her bladder.  A urinary catheter was again placed.  Given her ongoing urinary retention, I have placed a consult to urology (Dr. Jerilee Field).  Additional recommendations will be at the discretion of urology.  In the meantime, she will continue with physical therapy and the restrictions that her and I discussed at length this morning.

## 2023-02-26 MED ORDER — METHOCARBAMOL 750 MG PO TABS: 750.0000 mg | ORAL_TABLET | Freq: Four times a day (QID) | ORAL | 0 refills | Status: DC | PRN

## 2023-02-26 NOTE — Discharge Summary (Signed)
Patient ID: Dawn Reynolds MRN: [id number] DOB/AGE: 1966-03-26 57 y.o.  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 female who was admitted 02/24/2023 for operative treatment ofRadiculopathy due to lumbar intervertebral disc disorder. Patient has severe unremitting pain that affects sleep, daily activities, and work/hobbies. After pre-op clearance the patient was taken to the operating room on 02/24/2023 and underwent  Procedure(s): LEFT-SIDED LUMBAR 3- LUMBAR 4 TRANSFORAMINAL LUMBAR INTERBODY FUSION AND DECOMPRESSION WITH INSTRUMENTATION AND ALLOGRAFT.    Patient was given perioperative antibiotics:  Anti-infectives (From admission, onward)    Start     Dose/Rate Route Frequency Ordered Stop   02/24/23 1600  ceFAZolin (ANCEF) IVPB 2g/100 mL premix        2 g 200 mL/hr over 30 Minutes Intravenous Every 8 hours 02/24/23 1324 02/25/23 1022   02/24/23 0600  ceFAZolin (ANCEF) IVPB 2g/100 mL premix        2 g 200 mL/hr over 30 Minutes Intravenous On call to O.R. 02/24/23 0550 02/24/23 0800        Patient was given sequential compression devices, early ambulation, and chemoprophylaxis to prevent DVT.  Patient benefited maximally from hospital stay and  there were no complications.    Recent vital signs: Patient Vitals for the past 24 hrs:  BP Temp Temp src Pulse Resp SpO2  02/25/23 1635 (!) 111/56 98.4 F (36.9 C) Oral 72 20 94 %  02/25/23 1137 (!) 96/44 99.8 F (37.7 C) Oral 69 20 96 %  02/25/23 0751 (!) 131/59 99.5 F (37.5 C) Oral 73 20 94 %     Recent laboratory studies:  Recent Labs    02/24/23 0630  NA 140  K 2.8*  CL 102  CO2 26  BUN 12  CREATININE 0.89  GLUCOSE 111*  CALCIUM 9.0     Discharge Medications:   Allergies as of 02/26/2023       Reactions   Aspartame Other (See Comments)   Headaches.   Demerol [meperidine] Nausea And Vomiting        Medication List     STOP taking these medications    oxyCODONE-acetaminophen 5-325 MG tablet Commonly known as: PERCOCET/ROXICET   traMADol 50 MG tablet Commonly known as: ULTRAM       TAKE these medications    ALPRAZolam 0.25 MG tablet Commonly known as: XANAX Take 1 tablet (0.25 mg total) by mouth 2 (two) times daily as needed for anxiety.   amLODipine 10 MG tablet Commonly known as: NORVASC Take 1 tablet (10 mg total) by mouth daily.   amoxicillin 500 MG tablet Commonly known as: AMOXIL Take 2,000 mg by mouth See admin instructions. Take 2000 mg  by mouth 2 hours prior to dental procedures.   aspirin EC 81 MG tablet Take 1 tablet (81 mg total) by mouth 2 (two) times daily. What changed:  when to take this additional instructions   Calcium Carb-Cholecalciferol 600-800 MG-UNIT Tabs Take 1 tablet by mouth daily.   cyclobenzaprine 10 MG tablet Commonly known as: FLEXERIL TAKE 1 TABLET BY MOUTH AT BEDTIME AS NEEDED FOR MUSCLE SPASMS   DULoxetine 60 MG capsule Commonly known as: CYMBALTA Take 1 capsule (60 mg total) by mouth 2 (two) times daily.   gabapentin 300 MG capsule Commonly known as: NEURONTIN Take 1 capsule (300 mg total) by mouth 3 (three) times daily.   hydrochlorothiazide 12.5 MG capsule Commonly known as: MICROZIDE Take 1  capsule (12.5 mg total) by mouth daily.   HYDROcodone-acetaminophen 5-325 MG tablet Commonly known as: NORCO/VICODIN Take 1-2 tablets by mouth every 6 (six) hours as needed (for pain).   icosapent Ethyl 1 g capsule Commonly known as: VASCEPA TAKE 2 CAPSULES BY MOUTH 2 TIMES DAILY.   loratadine 10 MG tablet Commonly known as: CLARITIN Take by mouth daily as needed for allergies.   methocarbamol 750 MG tablet Commonly known as: Robaxin-750 Take 1 tablet (750 mg total) by mouth every 6 (six) hours as needed for muscle spasms.   nitrofurantoin (macrocrystal-monohydrate) 100 MG capsule Commonly known as: Macrobid Take 1 capsule (100 mg total) by mouth 2 (two) times daily. Use for 5-7 days as needed for UTI   omeprazole 20 MG capsule Commonly known as: PRILOSEC Take 1 capsule (20 mg total) by mouth daily.   ondansetron 4 MG tablet Commonly known as: ZOFRAN Take 4 mg by mouth every 6 (six) hours as needed for nausea or vomiting.   rosuvastatin 20 MG tablet Commonly known as: CRESTOR Take 1 tablet (20 mg total) by mouth daily.   vitamin C 1000 MG tablet Take 1,000 mg by mouth daily.   Vitamin D 125 MCG (5000 UT) Caps Take 5,000 Units by mouth daily.        Diagnostic Studies: DG Lumbar Spine 2-3 Views  Result Date: 02/25/2023 CLINICAL DATA:  Severe back pain after lumbar spinal fusion EXAM: LUMBAR SPINE - 2VIEW COMPARISON:  Lumbar spine radiograph 02/24/23 FINDINGS: Postsurgical changes from L3-L4 posterior spinal fusion and disc spacer placement. No evidence of hardware complications. Vertebral body heights are maintained. There are markedly dilated loops of small bowel in the central abdomen. Findings are worrisome for either an ileus or small-bowel obstruction. The transverse descending colon normal in caliber Partially visualized right hip arthroplasty. IMPRESSION: Markedly dilated loops of small bowel in the central abdomen. Findings are worrisome for either an ileus or  small-bowel obstruction. Recommend further evaluation with a CT of the abdomen and pelvis. These results will be called to the ordering clinician or representative by the Radiologist Assistant, and communication documented in the PACS or [employer]. Electronically Signed   By: Lorenza Cambridge M.D.   On: 02/25/2023 16:31   DG Lumbar Spine 2-3 Views  Result Date: 02/24/2023 CLINICAL DATA:  Fluoro guidance provided EXAM: LUMBAR SPINE - 2-3 VIEW FINDINGS: Dose: 42.0 mGy Fluoro time 43s IMPRESSION: C-arm fluoro guidance provided. Electronically Signed   By: Layla Maw M.D.   On: 02/24/2023 11:23   DG C-Arm 1-60 Min-No Report  Result Date: 02/24/2023 Fluoroscopy was utilized by the requesting physician.  No radiographic interpretation.   DG C-Arm 1-60 Min-No Report  Result Date: 02/24/2023 Fluoroscopy was utilized by the requesting physician.  No radiographic interpretation.   DG C-Arm 1-60 Min-No Report  Result Date: 02/24/2023 Fluoroscopy was utilized by the requesting physician.  No radiographic interpretation.   DG Lumbar Spine 1 View  Result Date: 02/24/2023 CLINICAL DATA:  [id number] Elective surgery [id number] EXAM: LUMBAR SPINE - 1 VIEW COMPARISON:  None Available. FINDINGS: Intraoperative lateral radiograph localization. Bracketed needles are at the level of L3-L4. IMPRESSION: Intraoperative localization at L3-L4. Electronically Signed   By: Caprice Renshaw M.D.   On: 02/24/2023 10:07    Disposition: Discharge disposition: 01-Home or Self Care       Discharge Instructions     Call MD / Call 911   Complete by: As directed    If you experience chest pain or shortness of breath, CALL 911 and be transported to the hospital emergency room.  If you develope a fever above 101 F, pus (white drainage) or increased drainage or redness at the wound, or calf pain, call your surgeon's office.   Call MD for:  difficulty breathing, headache or visual disturbances   Complete by: As directed     Call MD for:  persistant nausea and vomiting   Complete by: As directed    Call MD for:  severe uncontrolled pain   Complete by: As directed    Call MD for:  temperature >100.4   Complete by: As directed    Constipation Prevention   Complete by: As directed    Drink plenty of fluids.  Prune juice may be helpful.  You may use a stool softener, such as Colace (over the counter) 100 mg twice a day.  Use MiraLax (over the counter) for constipation as needed.   Diet - low sodium heart healthy   Complete by: As directed    Driving Restrictions   Complete by: As directed    Increase activity slowly   Complete by: As directed    Increase activity slowly as tolerated   Complete by: As directed    Lifting restrictions   Complete by: As directed    Other Restrictions   Complete by: As directed    Post-operative opioid taper instructions:   Complete by: As directed    POST-OPERATIVE OPIOID TAPER INSTRUCTIONS: It is important to wean off of your opioid medication as soon as possible. If you do not need pain medication after your surgery it is ok to stop day one. Opioids include: Codeine, Hydrocodone(Norco, Vicodin), Oxycodone(Percocet, oxycontin) and hydromorphone amongst others.  Long term and even short term use of opiods can cause: Increased pain response Dependence Constipation Depression Respiratory depression And more.  Withdrawal symptoms can include Flu like symptoms Nausea, vomiting And more Techniques to manage these symptoms Hydrate well Eat regular healthy meals Stay active Use relaxation techniques(deep breathing, meditating, yoga) Do Not substitute Alcohol to help with tapering If you have been on opioids for less than two weeks and do not have pain than it is ok to stop all together.  Plan to wean off of opioids This plan should start within one week post op of your joint replacement. Maintain the same interval or time between taking each dose and first decrease the  dose.  Cut the total daily intake of opioids by one tablet each day Next start to increase the time between doses. The last dose that should be eliminated is the evening dose.      Sexual Activity Restrictions   Complete by: As directed         Follow-up Information  Estill Bamberg, MD. Call.   Specialty: Orthopedic Surgery Why: As needed, If symptoms worsen Contact information: [address] [place] [address] [phone]                  Signed: Dannielle Burn 02/26/2023, 4:41 AM

## 2023-02-28 ENCOUNTER — Telehealth: Payer: Self-pay

## 2023-02-28 ENCOUNTER — Ambulatory Visit: Payer: Managed Care, Other (non HMO) | Admitting: Family Medicine

## 2023-02-28 ENCOUNTER — Encounter: Payer: Self-pay | Admitting: Family Medicine

## 2023-02-28 VITALS — BP 98/58 | HR 67 | Resp 18

## 2023-02-28 DIAGNOSIS — H8113 Benign paroxysmal vertigo, bilateral: Secondary | ICD-10-CM

## 2023-02-28 DIAGNOSIS — K567 Ileus, unspecified: Secondary | ICD-10-CM | POA: Diagnosis not present

## 2023-02-28 DIAGNOSIS — E876 Hypokalemia: Secondary | ICD-10-CM

## 2023-02-28 LAB — BASIC METABOLIC PANEL
BUN: 15 mg/dL (ref 6–23)
CO2: 27 mEq/L (ref 19–32)
Calcium: 8.8 mg/dL (ref 8.4–10.5)
Chloride: 104 mEq/L (ref 96–112)
Creatinine, Ser: 0.7 mg/dL (ref 0.40–1.20)
GFR: 96.51 mL/min (ref >60.00)
Glucose, Bld: 94 mg/dL (ref 70–99)
Potassium: 4.2 mEq/L (ref 3.5–5.1)
Sodium: 141 mEq/L (ref 135–145)

## 2023-02-28 MED ORDER — TRAMADOL HCL 50 MG PO TABS: 50.0000 mg | ORAL_TABLET | Freq: Two times a day (BID) | ORAL | 0 refills | Status: AC | PRN

## 2023-02-28 NOTE — Transitions of Care (Post Inpatient/ED Visit) (Signed)
   02/28/2023  Name: Dawn Reynolds MRN: [id number] DOB: 17-Dec-1965  Today's TOC FU Call Status: Today's TOC FU Call Status:: Unsuccessul Call (1st Attempt) Unsuccessful Call (1st Attempt) Date: 02/28/23  Attempted to reach the patient regarding the most recent Inpatient/ED visit.  Follow Up Plan: No further outreach attempts will be made at this time. We have been unable to contact the patient. Patient already seen in office Signature Karena Addison, LPN [facility] Nurse Health Advisor Direct Dial [phone]

## 2023-02-28 NOTE — Patient Instructions (Addendum)
It was good to see you again today- I am sorry that you are having such a hard time We will check your K level today  Please keep me posted about your vertigo symptoms - I think you have BPPV Please keep me up to date about your BP- no BP meds for now!

## 2023-02-28 NOTE — Progress Notes (Signed)
[facility] [address], [address] [place], [place] [address] [phone]  Date:  02/28/2023   Name:  Dawn Reynolds   DOB:  03/01/1966   MRN:  [id number]  PCP:  Pearline Cables, MD    Chief Complaint: post surgery concerns (1. Potassium of 2.8 at hospital. 2. Friday night/Saturday morning: pt started feeling a spinning sensation after she turned her head wring. 3. She has not been on her BP meds in 3 days since her Bps have been low. 4. Might need a refill on Macrobid- but she does see Urology. )   History of Present Illness:  Dawn Reynolds is a 57 y.o. very pleasant female patient who presents with the following:  Pt seen today to discuss recent spine surgery- last seen by myself in November  History of hypertension, dyslipidemia, prediabetes, obesity and neuropathy  Lab Results  Component Value Date   HGBA1C 6.3 09/27/2022   Admit date: 02/24/2023 Discharge date: 02/26/2023   Admission Diagnoses:  Principal Problem:   Radiculopathy due to lumbar intervertebral disc disorder Surgeries: Procedure(s): LEFT-SIDED LUMBAR 3- LUMBAR 4 TRANSFORAMINAL LUMBAR INTERBODY FUSION AND DECOMPRESSION WITH INSTRUMENTATION AND ALLOGRAFT on 02/24/2023   Hospital Course: Dawn Reynolds is an 57 y.o. female who was admitted 02/24/2023 for operative treatment ofRadiculopathy due to lumbar intervertebral disc disorder. Patient has severe unremitting pain that affects sleep, daily activities, and work/hobbies. After pre-op clearance the patient was taken to the operating room on 02/24/2023 and underwent  Procedure(s): LEFT-SIDED LUMBAR 3- LUMBAR 4 TRANSFORAMINAL LUMBAR INTERBODY FUSION AND DECOMPRESSION WITH INSTRUMENTATION AND ALLOGRAFT.   Patient was given perioperative antibiotics:   She notes her BP has been low since she went home-she has been holding her 2 blood pressure medications, amlodipine and hydrochlorothiazide Also her K was low- it was  2.8 at time of surgery and not rechecked as of yet  She did take some potassium that she had at home -she is taking about 100 mill equivalents total over the last 3 days.  She did not take any today She took one dose of left over oxycodone yesterday but nothing since  911 came out to see her yesterday because she was having too much pain, patient states they evaluated her and she was allowed to stay home She has started back on her meloxicam - this does seem to be helping her a lot which is great  Today is Monday Early Saturday am she moved her head and noted vertigo sx which are new to her As long as she holds her head still she is not having these sx  Vertigo resolved within a few seconds of holding her head still.  No new tinnitus, no hearing loss or vision change No other new neurologic symptoms such as numbness or weakness of any body part  Her husband is her main caregiver at this time-he is here with her today  Patient notes she had urinary retention following her surgery, she still has a Foley catheter in place.  She was told she would need to wear it for about 2 weeks, she has a urology appointment scheduled for May 1  Patient also notes she had an x-ray done in postop which showed possible ileus- LUMBAR SPINE - 2VIEW   COMPARISON:  Lumbar spine radiograph 02/24/23   FINDINGS: Postsurgical changes from L3-L4 posterior spinal fusion and disc spacer placement. No evidence of hardware complications. Vertebral body heights are maintained.   There  are markedly dilated loops of small bowel in the central abdomen. Findings are worrisome for either an ileus or small-bowel obstruction. The transverse descending colon normal in caliber   Partially visualized right hip arthroplasty.   IMPRESSION: Markedly dilated loops of small bowel in the central abdomen. Findings are worrisome for either an ileus or small-bowel obstruction. Recommend further evaluation with a CT of the abdomen and  pelvis.   These results will be called to the ordering clinician or representative by the Radiologist Assistant, and communication documented in the PACS or [facility] Dashboard.   ----------------------------------------------------------------  Patient reports she took notice of this on reviewing her chart, she put herself on liquids only for couple of days.  She is now eating again, she has been listening to her bowel sounds and they seem normal.  She is having bowel movements.  She is not vomiting I offered to do another x-ray for her, at this time she declines noting that her symptoms resolved  BP Readings from Last 3 Encounters:  02/28/23 (!) 98/58  02/25/23 (!) 111/56  02/15/23 (!) 130/57     Patient Active Problem List   Diagnosis Date Noted   Radiculopathy due to lumbar intervertebral disc disorder 02/24/2023   S/P TKR (total knee replacement), bilateral 09/27/2018   Bilateral primary osteoarthritis of knee 09/26/2018   Obesity (BMI 35.0-39.9 without comorbidity) 04/06/2018   High triglycerides 04/06/2018   Osteoarthritis of left knee 12/21/2017   Chest pain 07/09/2017   Fibromyalgia 07/09/2017   Hypertension 07/09/2017   Abnormal weight gain 01/31/2014   Empty sella syndrome 01/31/2014   Prediabetes 01/31/2014   Neuropathy, peripheral 12/25/2013    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   Chicken pox    Depression    hx of in past situational   Fibromyalgia    GERD (gastroesophageal reflux disease)    Headache    hx of migraines   History of kidney stones    1996   Hypertension    Pre-diabetes    Sleep apnea     Past Surgical History:  Procedure Laterality Date   ABDOMINAL HYSTERECTOMY     CHOLECYSTECTOMY     03-31-18 Derrell Lolling   CHOLECYSTECTOMY N/A 03/31/2018   Procedure: LAPAROSCOPIC CHOLECYSTECTOMY;  Surgeon: Axel Filler, MD;  Location: [facility];  Service: General;  Laterality:  N/A;   COLONOSCOPY     endoplantar fasciotomy     GALLBLADDER SURGERY  03/31/2018   HIP ARTHROPLASTY Right 2023   KNEE ARTHROSCOPY     left knee 2012   LAPAROSCOPIC CHOLECYSTECTOMY     REPLACEMENT TOTAL KNEE BILATERAL Bilateral 09/27/2018   TONSILLECTOMY     1999   TOTAL KNEE ARTHROPLASTY Bilateral 09/27/2018   Procedure: TOTAL KNEE BILATERAL;  Surgeon: Gean Birchwood, MD;  Location: [facility];  Service: Orthopedics;  Laterality: Bilateral;   TRANSFORAMINAL LUMBAR INTERBODY FUSION (TLIF) WITH PEDICLE SCREW FIXATION 1 LEVEL Left 02/24/2023   Procedure: LEFT-SIDED LUMBAR 3- LUMBAR 4 TRANSFORAMINAL LUMBAR INTERBODY FUSION AND DECOMPRESSION WITH INSTRUMENTATION AND ALLOGRAFT;  Surgeon: Estill Bamberg, MD;  Location: [facility];  Service: Orthopedics;  Laterality: Left;   UPPER GASTROINTESTINAL ENDOSCOPY      Social History   Tobacco Use   Smoking status: Never   Smokeless tobacco: Never  Vaping Use   Vaping Use: Never used  Substance Use Topics   Alcohol use: Yes  Comment: occ   Drug use: No    Family History  Problem Relation Age of Onset   Heart disease Father    Prostate cancer Father    Diabetes Father    Irritable bowel syndrome Father    Kidney disease Father    Mitral valve prolapse Mother    Heart disease Mother    Breast cancer Mother    Pancreatic cancer Maternal Aunt    Liver cancer Paternal Uncle    Colon cancer Paternal Uncle    Esophageal cancer Neg Hx    Stomach cancer Neg Hx    Rectal cancer Neg Hx     Allergies  Allergen Reactions   Aspartame Other (See Comments)    Headaches.   Demerol [Meperidine] Nausea And Vomiting    Medication list has been reviewed and updated.  Current Outpatient Medications on File Prior to Visit  Medication Sig Dispense Refill   ALPRAZolam (XANAX) 0.25 MG tablet Take 1 tablet (0.25 mg total) by mouth 2 (two) times daily as needed for anxiety. 30 tablet 1   amLODipine (NORVASC) 10 MG tablet Take 1 tablet (10 mg total) by mouth  daily. 90 tablet 3   amoxicillin (AMOXIL) 500 MG tablet Take 2,000 mg by mouth See admin instructions. Take 2000 mg by mouth 2 hours prior to dental procedures.     Ascorbic Acid (VITAMIN C) 1000 MG tablet Take 1,000 mg by mouth daily.     aspirin EC 81 MG tablet Take 1 tablet (81 mg total) by mouth 2 (two) times daily. (Patient taking differently: Take 81 mg by mouth at bedtime. Pt takes once daily at night) 60 tablet 0   Calcium Carb-Cholecalciferol 600-800 MG-UNIT TABS Take 1 tablet by mouth daily.     Cholecalciferol (VITAMIN D) 125 MCG (5000 UT) CAPS Take 5,000 Units by mouth daily.     cyclobenzaprine (FLEXERIL) 10 MG tablet TAKE 1 TABLET BY MOUTH AT BEDTIME AS NEEDED FOR MUSCLE SPASMS 30 tablet 2   DULoxetine (CYMBALTA) 60 MG capsule Take 1 capsule (60 mg total) by mouth 2 (two) times daily. 180 capsule 1   gabapentin (NEURONTIN) 300 MG capsule Take 1 capsule (300 mg total) by mouth 3 (three) times daily. 270 capsule 1   hydrochlorothiazide (MICROZIDE) 12.5 MG capsule Take 1 capsule (12.5 mg total) by mouth daily. 90 capsule 3   HYDROcodone-acetaminophen (NORCO/VICODIN) 5-325 MG tablet Take 1-2 tablets by mouth every 6 (six) hours as needed (for pain). 40 tablet 0   icosapent Ethyl (VASCEPA) 1 g capsule TAKE 2 CAPSULES BY MOUTH 2 TIMES DAILY. 360 capsule 1   loratadine (CLARITIN) 10 MG tablet Take by mouth daily as needed for allergies.     methocarbamol (ROBAXIN-750) 750 MG tablet Take 1 tablet (750 mg total) by mouth every 6 (six) hours as needed for muscle spasms. 40 tablet 0   nitrofurantoin, macrocrystal-monohydrate, (MACROBID) 100 MG capsule Take 1 capsule (100 mg total) by mouth 2 (two) times daily. Use for 5-7 days as needed for UTI 30 capsule 0   omeprazole (PRILOSEC) 20 MG capsule Take 1 capsule (20 mg total) by mouth daily. 90 capsule 1   ondansetron (ZOFRAN) 4 MG tablet Take 4 mg by mouth every 6 (six) hours as needed for nausea or vomiting.     rosuvastatin (CRESTOR) 20 MG tablet  Take 1 tablet (20 mg total) by mouth daily. 90 tablet 3   No current facility-administered medications on file prior to visit.    Review of  Systems:  As per HPI- otherwise negative.   Physical Examination: Vitals:   02/28/23 1123  BP: (!) 98/58  Pulse: 67  Resp: 18  SpO2: 95%   Vitals:   There is no height or weight on file to calculate BMI. Ideal Body Weight:    GEN: no acute distress.  Obese, appears uncomfortable as would be expected from recent back surgery HEENT: Atraumatic, Normocephalic.  Ears and Nose: No external deformity. CV: RRR, No M/G/R. No JVD. No thrill. No extra heart sounds. PULM: CTA B, no wheezes, crackles, rhonchi. No retractions. No resp. distress. No accessory muscle use. ABD: S, NT, ND.  Bowel sounds are present.  Abdominal exam is otherwise limited due to presence of back brace EXTR: No c/c/e PSYCH: Normally interactive. Conversant.  Wearing back brace Did not attempt Dix-Hallpike testing due to recent back surgery.  Note normal upper extremity strength bilaterally.  Normal sensation in all limbs.  Patient has not noted any lower extremity weakness Normal facial movement Foley catheter with leg bag is in place  Assessment and Plan: Hypokalemia - Plan: Basic metabolic panel, CANCELED: Basic metabolic panel  Benign paroxysmal positional vertigo due to bilateral vestibular disorder  Ileus  Patient seen today for follow-up.  As above, she recently underwent lumbar spine surgery Her postop course was complicated by urinary retention and a likely ileus.  She still has her Foley catheter in place, she has plans to see urology to have this removed Her ileus symptoms have resolved, she declines further imaging at this time Her blood pressure has been low since after her surgery.  I offered to have her seen in the ER, IV fluids might be helpful.  She declines at this time but plans to drink more fluids and increase salt intake at home.  She will keep me  closely posted about her blood pressure, we can add back her medications eventually as they become necessary We will also follow-up on her hypokalemia today  Signed Abbe Amsterdam, MD  Received BMP- message to pt Hypokalemia resolved  Results for orders placed or performed in visit on 02/28/23  Basic metabolic panel  Result Value Ref Range   Sodium 141 135 - 145 mEq/L   Potassium 4.2 3.5 - 5.1 mEq/L   Chloride 104 96 - 112 mEq/L   CO2 27 19 - 32 mEq/L   Glucose, Bld 94 70 - 99 mg/dL   BUN 15 6 - 23 mg/dL   Creatinine, Ser 1.61 0.40 - 1.20 mg/dL   GFR 09.60 >45.40 mL/min   Calcium 8.8 8.4 - 10.5 mg/dL

## 2023-03-30 ENCOUNTER — Encounter: Payer: Self-pay | Admitting: Family Medicine

## 2023-03-30 ENCOUNTER — Other Ambulatory Visit: Payer: Self-pay | Admitting: Family Medicine

## 2023-03-30 DIAGNOSIS — F419 Anxiety disorder, unspecified: Secondary | ICD-10-CM

## 2023-03-30 MED ORDER — AMOXICILLIN 500 MG PO TABS: 2000.0000 mg | ORAL_TABLET | ORAL | 1 refills | Status: DC

## 2023-03-30 MED ORDER — ALPRAZOLAM 0.25 MG PO TABS
0.2500 mg | ORAL_TABLET | Freq: Two times a day (BID) | ORAL | 0 refills | Status: DC | PRN
Start: 2023-03-30 — End: 2023-07-25

## 2023-03-30 NOTE — Telephone Encounter (Signed)
Okay for Rx?  

## 2023-03-31 ENCOUNTER — Other Ambulatory Visit: Payer: Self-pay | Admitting: Family Medicine

## 2023-03-31 DIAGNOSIS — M797 Fibromyalgia: Secondary | ICD-10-CM

## 2023-05-19 ENCOUNTER — Encounter: Payer: Self-pay | Admitting: Internal Medicine

## 2023-07-02 ENCOUNTER — Encounter: Payer: Self-pay | Admitting: Family Medicine

## 2023-07-02 DIAGNOSIS — I10 Essential (primary) hypertension: Secondary | ICD-10-CM

## 2023-07-02 MED ORDER — HYDROCHLOROTHIAZIDE 12.5 MG PO CAPS: 12.5000 mg | ORAL_CAPSULE | Freq: Every day | ORAL | 3 refills | Status: DC

## 2023-07-03 ENCOUNTER — Other Ambulatory Visit: Payer: Self-pay | Admitting: Family Medicine

## 2023-07-03 DIAGNOSIS — M797 Fibromyalgia: Secondary | ICD-10-CM

## 2023-07-24 ENCOUNTER — Encounter: Payer: Self-pay | Admitting: Family Medicine

## 2023-07-24 DIAGNOSIS — F419 Anxiety disorder, unspecified: Secondary | ICD-10-CM

## 2023-07-25 MED ORDER — ALPRAZOLAM 0.25 MG PO TABS
0.2500 mg | ORAL_TABLET | Freq: Two times a day (BID) | ORAL | 1 refills | Status: DC | PRN
Start: 2023-07-25 — End: 2024-10-01

## 2023-07-25 MED ORDER — TIRZEPATIDE 2.5 MG/0.5ML ~~LOC~~ SOAJ
2.5000 mg | SUBCUTANEOUS | 1 refills | Status: DC
Start: 2023-07-25 — End: 2023-08-10

## 2023-07-25 NOTE — Addendum Note (Signed)
Addended by: Abbe Amsterdam C on: 07/25/2023 04:39 PM   Modules accepted: Orders

## 2023-07-26 ENCOUNTER — Telehealth: Payer: Self-pay

## 2023-07-26 NOTE — Telephone Encounter (Signed)
Received PA for [facility].   Odetta Bordner (Key: [id number]) PA was archived since pt is using a $25 coupon card.

## 2023-08-04 NOTE — Telephone Encounter (Signed)
Spoke with the pharmacy- coupons still require PA's.   PA initiated: Donique Brizzi (Key: [id number])   Your information has been submitted and will be reviewed by [employer]. You may close this dialog, return to your dashboard, and perform other tasks. An electronic determination will be received in CoverMyMeds within 72-120 hours. You can see the latest determination by locating this request on your dashboard or by reopening this request. You will receive a fax copy of the determination. If Rosann Auerbach has not responded in 120 hours, contact [employer] at [phone].

## 2023-08-04 NOTE — Telephone Encounter (Addendum)
Spoke with the pharmacy- coupons still require PA's.    PA initiated: Sheng Althoff (Key: [id number])

## 2023-08-05 NOTE — Telephone Encounter (Signed)
This request is denied because the medication requested is not currently FDA approved for weight  loss. Please resubmit for a medication that is FDA approved for weight loss. However, please note  that weight loss medications are subject to benefit verification and medical necessity criteria, and  approval is not guaranteed. Dawn Reynolds is considered medical.

## 2023-08-05 NOTE — Telephone Encounter (Signed)
This request is denied because the medication requested is not currently FDA approved for weight  loss. Please resubmit for a medication that is FDA approved for weight loss. However, please note  that weight loss medications are subject to benefit verification and medical necessity criteria, and  approval is not guaranteed. Greggory Keen is considered medical.

## 2023-08-09 NOTE — Patient Instructions (Incomplete)
Good to see you today- I will be in touch with your urine culture asap Start on macrobid twice a day for one week We will check A1c today  Recommend shingles series if not done!

## 2023-08-09 NOTE — Progress Notes (Addendum)
[facility] [address], [address] [place], [place] [address] [phone]  Date:  08/10/2023   Name:  Dawn Reynolds   DOB:  Sep 22, 1966   MRN:  [id number]  PCP:  Pearline Cables, MD    Chief Complaint: Flank Pain (X1 weeks, pt states having left side pain and strong odor. No burning. )   History of Present Illness:  Dawn Reynolds is a 57 y.o. very pleasant female patient who presents with the following:  Pt seen today with concern of possible UTI- sx for about one week She tried to flush it out with more water, cranberry She just notes that her urine smells strong No blood in her urine  No fever, no chills, no vomiting or abd pain Last seen by myself in April - at that time she recently had a lumbar fusion History of hypertension, dyslipidemia, prediabetes, obesity and neuropathy   Lab Results  Component Value Date   HGBA1C 6.3 09/27/2022   She did have urinary retention after her surgery and had to use an indwelling foley for just 2 weeks  Flu shot Colon cancer screening Patient Active Problem List   Diagnosis Date Noted   Radiculopathy due to lumbar intervertebral disc disorder 02/24/2023   S/P TKR (total knee replacement), bilateral 09/27/2018   Bilateral primary osteoarthritis of knee 09/26/2018   Obesity (BMI 35.0-39.9 without comorbidity) 04/06/2018   High triglycerides 04/06/2018   Osteoarthritis of left knee 12/21/2017   Chest pain 07/09/2017   Fibromyalgia 07/09/2017   Hypertension 07/09/2017   Abnormal weight gain 01/31/2014   Empty sella syndrome (HCC) 01/31/2014   Prediabetes 01/31/2014   Neuropathy, peripheral 12/25/2013    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   Chicken pox    Depression    hx of in past situational   Fibromyalgia    GERD (gastroesophageal reflux disease)    Headache    hx of migraines   History of  kidney stones    1996   Hypertension    Pre-diabetes    Sleep apnea     Past Surgical History:  Procedure Laterality Date   ABDOMINAL HYSTERECTOMY     CHOLECYSTECTOMY     03-31-18 Derrell Lolling   CHOLECYSTECTOMY N/A 03/31/2018   Procedure: LAPAROSCOPIC CHOLECYSTECTOMY;  Surgeon: Axel Filler, MD;  Location: [facility];  Service: General;  Laterality: N/A;   COLONOSCOPY     endoplantar fasciotomy     GALLBLADDER SURGERY  03/31/2018   HIP ARTHROPLASTY Right 2023   KNEE ARTHROSCOPY     left knee 2012   LAPAROSCOPIC CHOLECYSTECTOMY     REPLACEMENT TOTAL KNEE BILATERAL Bilateral 09/27/2018   TONSILLECTOMY     1999   TOTAL KNEE ARTHROPLASTY Bilateral 09/27/2018   Procedure: TOTAL KNEE BILATERAL;  Surgeon: Gean Birchwood, MD;  Location: [facility];  Service: Orthopedics;  Laterality: Bilateral;   TRANSFORAMINAL LUMBAR INTERBODY FUSION (TLIF) WITH PEDICLE SCREW FIXATION 1 LEVEL Left 02/24/2023   Procedure: LEFT-SIDED LUMBAR 3- LUMBAR 4 TRANSFORAMINAL LUMBAR INTERBODY FUSION AND DECOMPRESSION WITH INSTRUMENTATION AND ALLOGRAFT;  Surgeon: Estill Bamberg, MD;  Location: [facility];  Service: Orthopedics;  Laterality: Left;   UPPER GASTROINTESTINAL ENDOSCOPY      Social History   Tobacco Use   Smoking status: Never   Smokeless tobacco: Never  Vaping Use   Vaping status: Never  Used  Substance Use Topics   Alcohol use: Yes    Comment: occ   Drug use: No    Family History  Problem Relation Age of Onset   Heart disease Father    Prostate cancer Father    Diabetes Father    Irritable bowel syndrome Father    Kidney disease Father    Mitral valve prolapse Mother    Heart disease Mother    Breast cancer Mother    Pancreatic cancer Maternal Aunt    Liver cancer Paternal Uncle    Colon cancer Paternal Uncle    Esophageal cancer Neg Hx    Stomach cancer Neg Hx    Rectal cancer Neg Hx     Allergies  Allergen Reactions   Aspartame Other (See Comments)    Headaches.   Demerol [Meperidine]  Nausea And Vomiting    Medication list has been reviewed and updated.  Current Outpatient Medications on File Prior to Visit  Medication Sig Dispense Refill   ALPRAZolam (XANAX) 0.25 MG tablet Take 1 tablet (0.25 mg total) by mouth 2 (two) times daily as needed for anxiety. 30 tablet 1   amLODipine (NORVASC) 10 MG tablet Take 1 tablet (10 mg total) by mouth daily. 90 tablet 3   amoxicillin (AMOXIL) 500 MG tablet Take 4 tablets (2,000 mg total) by mouth See admin instructions. Take 2000 mg by mouth 2 hours prior to dental procedures. 12 tablet 1   Ascorbic Acid (VITAMIN C) 1000 MG tablet Take 1,000 mg by mouth daily.     aspirin EC 81 MG tablet Take 1 tablet (81 mg total) by mouth 2 (two) times daily. (Patient taking differently: Take 81 mg by mouth at bedtime. Pt takes once daily at night) 60 tablet 0   Calcium Carb-Cholecalciferol 600-800 MG-UNIT TABS Take 1 tablet by mouth daily.     Cholecalciferol (VITAMIN D) 125 MCG (5000 UT) CAPS Take 5,000 Units by mouth daily.     cyclobenzaprine (FLEXERIL) 10 MG tablet TAKE 1 TABLET BY MOUTH AT BEDTIME AS NEEDED FOR MUSCLE SPASMS. 30 tablet 2   DULoxetine (CYMBALTA) 60 MG capsule Take 1 capsule (60 mg total) by mouth 2 (two) times daily. 180 capsule 1   gabapentin (NEURONTIN) 300 MG capsule Take 1 capsule (300 mg total) by mouth 3 (three) times daily. 270 capsule 1   hydrochlorothiazide (MICROZIDE) 12.5 MG capsule Take 1 capsule (12.5 mg total) by mouth daily. 90 capsule 3   icosapent Ethyl (VASCEPA) 1 g capsule TAKE 2 CAPSULES BY MOUTH 2 TIMES DAILY. 360 capsule 1   loratadine (CLARITIN) 10 MG tablet Take by mouth daily as needed for allergies.     omeprazole (PRILOSEC) 20 MG capsule Take 1 capsule (20 mg total) by mouth daily. 90 capsule 1   rosuvastatin (CRESTOR) 20 MG tablet Take 1 tablet (20 mg total) by mouth daily. 90 tablet 3   traMADol (ULTRAM) 50 MG tablet Take 1 tablet (50 mg total) by mouth every 12 (twelve) hours as needed. 30 tablet 0    No current facility-administered medications on file prior to visit.    Review of Systems:  As per HPI- otherwise negative.   Physical Examination: Vitals:   08/10/23 1417  BP: 122/84  Pulse: 68  Resp: 18  Temp: 97.8 F (36.6 C)  SpO2: 98%   Vitals:   08/10/23 1417  Weight: 275 lb 12.8 oz (125.1 kg)  Height: 5\' 6"  (1.676 m)   Body mass index is 44.52 kg/m. Ideal  Body Weight: Weight in (lb) to have BMI = 25: 154.6  GEN: no acute distress.  Obese, looks well HEENT: Atraumatic, Normocephalic.  Ears and Nose: No external deformity. CV: RRR, No M/G/R. No JVD. No thrill. No extra heart sounds. PULM: CTA B, no wheezes, crackles, rhonchi. No retractions. No resp. distress. No accessory muscle use. ABD: S, NT, ND, +BS. No rebound. No HSM.  No CVA tenderness, abdomen is benign EXTR: No c/c/e PSYCH: Normally interactive. Conversant.   Lab Results  Component Value Date   HGBA1C 6.3 09/27/2022   Results for orders placed or performed in visit on 08/10/23  POCT Urinalysis Dipstick (Automated)  Result Value Ref Range   Color, UA Amber    Clarity, UA hazy    Glucose, UA Negative Negative   Bilirubin, UA negative    Ketones, UA negative    Spec Grav, UA 1.015 1.010 - 1.025   Blood, UA negative    pH, UA 6.0 5.0 - 8.0   Protein, UA Negative Negative   Urobilinogen, UA 0.2 0.2 or 1.0 E.U./dL   Nitrite, UA positive    Leukocytes, UA Small (1+) (A) Negative    Assessment and Plan: Urinary frequency - Plan: nitrofurantoin, macrocrystal-monohydrate, (MACROBID) 100 MG capsule, CANCELED: Urine Culture, CANCELED: POCT urinalysis dipstick  Abnormal urine odor - Plan: POCT Urinalysis Dipstick (Automated), Urine Culture  Pre-diabetes - Plan: Hemoglobin A1c  Patient seen today with concern about UTI.  Her UA is suspicious, culture is pending.  Will start on Macrobid, she will let me know if not improving in the next 1 to 2 days- sooner if worse  A1c pending to follow-up  prediabetes  Signed Abbe Amsterdam, MD  Addendum 10/4, received urine culture Grew Klebsiella which is intermediate to nitrofurantoin-will switch to Augmentin Results for orders placed or performed in visit on 08/10/23  Urine Culture   Specimen: Urine  Result Value Ref Range   MICRO NUMBER: [id number]    SPECIMEN QUALITY: Adequate    Sample Source URINE    STATUS: FINAL    ISOLATE 1: Klebsiella pneumoniae (A)       Susceptibility   Klebsiella pneumoniae - URINE CULTURE, REFLEX    AMOX/CLAVULANIC <=2 Sensitive     AMPICILLIN 16 Resistant     AMPICILLIN/SULBACTAM 4 Sensitive     CEFAZOLIN* <=4 Not Reportable      * For infections other than uncomplicated UTI caused by E. coli, K. pneumoniae or P. mirabilis: Cefazolin is resistant if MIC > or = 8 mcg/mL. (Distinguishing susceptible versus intermediate for isolates with MIC < or = 4 mcg/mL requires additional testing.) For uncomplicated UTI caused by E. coli, K. pneumoniae or P. mirabilis: Cefazolin is susceptible if MIC <32 mcg/mL and predicts susceptible to the oral agents cefaclor, cefdinir, cefpodoxime, cefprozil, cefuroxime, cephalexin and loracarbef.     CEFTAZIDIME <=1 Sensitive     CEFEPIME <=1 Sensitive     CEFTRIAXONE <=1 Sensitive     CIPROFLOXACIN <=0.25 Sensitive     LEVOFLOXACIN <=0.12 Sensitive     GENTAMICIN <=1 Sensitive     IMIPENEM <=0.25 Sensitive     NITROFURANTOIN 64 Intermediate     PIP/TAZO <=4 Sensitive     TOBRAMYCIN <=1 Sensitive     TRIMETH/SULFA* <=20 Sensitive      * For infections other than uncomplicated UTI caused by E. coli, K. pneumoniae or P. mirabilis: Cefazolin is resistant if MIC > or = 8 mcg/mL. (Distinguishing susceptible versus intermediate for isolates with MIC < or =  4 mcg/mL requires additional testing.) For uncomplicated UTI caused by E. coli, K. pneumoniae or P. mirabilis: Cefazolin is susceptible if MIC <32 mcg/mL and predicts susceptible to the oral agents cefaclor,  cefdinir, cefpodoxime, cefprozil, cefuroxime, cephalexin and loracarbef. Legend: S = Susceptible  I = Intermediate R = Resistant  NS = Not susceptible SDD = Susceptible Dose Dependent * = Not Tested  NR = Not Reported **NN = See Therapy Comments   Hemoglobin A1c  Result Value Ref Range   Hgb A1c MFr Bld 6.4 4.6 - 6.5 %  POCT Urinalysis Dipstick (Automated)  Result Value Ref Range   Color, UA Amber    Clarity, UA hazy    Glucose, UA Negative Negative   Bilirubin, UA negative    Ketones, UA negative    Spec Grav, UA 1.015 1.010 - 1.025   Blood, UA negative    pH, UA 6.0 5.0 - 8.0   Protein, UA Negative Negative   Urobilinogen, UA 0.2 0.2 or 1.0 E.U./dL   Nitrite, UA positive    Leukocytes, UA Small (1+) (A) Negative

## 2023-08-10 ENCOUNTER — Other Ambulatory Visit: Payer: Self-pay | Admitting: Family Medicine

## 2023-08-10 ENCOUNTER — Ambulatory Visit: Payer: Managed Care, Other (non HMO) | Admitting: Family Medicine

## 2023-08-10 VITALS — BP 122/84 | HR 68 | Temp 97.8°F | Resp 18 | Ht 66.0 in | Wt 275.8 lb

## 2023-08-10 DIAGNOSIS — R8290 Unspecified abnormal findings in urine: Secondary | ICD-10-CM | POA: Diagnosis not present

## 2023-08-10 DIAGNOSIS — R350 Frequency of micturition: Secondary | ICD-10-CM

## 2023-08-10 DIAGNOSIS — R7303 Prediabetes: Secondary | ICD-10-CM

## 2023-08-10 LAB — POC URINALSYSI DIPSTICK (AUTOMATED)
Bilirubin, UA: NEGATIVE
Blood, UA: NEGATIVE
Glucose, UA: NEGATIVE
Ketones, UA: NEGATIVE
Nitrite, UA: POSITIVE
Protein, UA: NEGATIVE
Spec Grav, UA: 1.015 (ref 1.010–1.025)
Urobilinogen, UA: 0.2 U/dL
pH, UA: 6 (ref 5.0–8.0)

## 2023-08-10 MED ORDER — NITROFURANTOIN MONOHYD MACRO 100 MG PO CAPS
100.0000 mg | ORAL_CAPSULE | Freq: Two times a day (BID) | ORAL | 0 refills | Status: DC
Start: 2023-08-10 — End: 2023-08-12

## 2023-08-11 ENCOUNTER — Encounter: Payer: Self-pay | Admitting: Family Medicine

## 2023-08-11 LAB — HEMOGLOBIN A1C: Hgb A1c MFr Bld: 6.4 % (ref 4.6–6.5)

## 2023-08-12 ENCOUNTER — Encounter: Payer: Self-pay | Admitting: Family Medicine

## 2023-08-12 DIAGNOSIS — R350 Frequency of micturition: Secondary | ICD-10-CM

## 2023-08-12 LAB — URINE CULTURE
MICRO NUMBER:: 15542377
SPECIMEN QUALITY:: ADEQUATE

## 2023-08-12 MED ORDER — AMOXICILLIN-POT CLAVULANATE 500-125 MG PO TABS
1.0000 | ORAL_TABLET | Freq: Two times a day (BID) | ORAL | 0 refills | Status: DC
Start: 2023-08-12 — End: 2023-11-18

## 2023-08-12 NOTE — Addendum Note (Signed)
Addended by: Abbe Amsterdam C on: 08/12/2023 08:20 PM   Modules accepted: Orders

## 2023-09-12 ENCOUNTER — Other Ambulatory Visit: Payer: Self-pay | Admitting: Family Medicine

## 2023-09-12 DIAGNOSIS — Z1231 Encounter for screening mammogram for malignant neoplasm of breast: Secondary | ICD-10-CM

## 2023-09-16 ENCOUNTER — Other Ambulatory Visit: Payer: Managed Care, Other (non HMO)

## 2023-09-16 DIAGNOSIS — R350 Frequency of micturition: Secondary | ICD-10-CM

## 2023-09-17 LAB — URINE CULTURE
MICRO NUMBER:: 15706072
SPECIMEN QUALITY:: ADEQUATE

## 2023-09-18 ENCOUNTER — Encounter: Payer: Self-pay | Admitting: Family Medicine

## 2023-09-26 ENCOUNTER — Encounter: Payer: Managed Care, Other (non HMO) | Admitting: Family Medicine

## 2023-09-28 ENCOUNTER — Other Ambulatory Visit: Payer: Self-pay | Admitting: Family Medicine

## 2023-09-28 DIAGNOSIS — M797 Fibromyalgia: Secondary | ICD-10-CM

## 2023-09-30 ENCOUNTER — Ambulatory Visit: Payer: Managed Care, Other (non HMO)

## 2023-09-30 NOTE — Progress Notes (Unsigned)
[facility] [address], [address] [place], [place] [address] [phone]  Date:  10/03/2023   Name:  Dawn Reynolds   DOB:  February 21, 1966   MRN:  [id number]  PCP:  Pearline Cables, MD    Chief Complaint: No chief complaint on file.   History of Present Illness:  Dawn Reynolds is a 57 y.o. very pleasant female patient who presents with the following:  Pt seen today for CPE- History of hypertension, dyslipidemia, prediabetes, obesity and neuropathy  Last visit with myself was in October for a UTI  She had a lumbar fusion in spring of this year   Flu Mammo- scheduled for next month  Pap- s/p hyst Colon- 2019 Shingrix  Amlodipine Hydrochlorothiazide 12.5 Vascepa Crestor Asa Cymbalta Gabapentin  Labs:update today She had an elevated TSH earlier this year that has not been rechecked, will check this for her today  Lab Results  Component Value Date   TSH 6.38 (H) 09/27/2022    Lab Results  Component Value Date   HGBA1C 6.4 08/10/2023      Patient Active Problem List   Diagnosis Date Noted   Radiculopathy due to lumbar intervertebral disc disorder 02/24/2023   S/P TKR (total knee replacement), bilateral 09/27/2018   Bilateral primary osteoarthritis of knee 09/26/2018   Obesity (BMI 35.0-39.9 without comorbidity) 04/06/2018   High triglycerides 04/06/2018   Osteoarthritis of left knee 12/21/2017   Chest pain 07/09/2017   Fibromyalgia 07/09/2017   Hypertension 07/09/2017   Abnormal weight gain 01/31/2014   Empty sella syndrome (HCC) 01/31/2014   Prediabetes 01/31/2014   Neuropathy, peripheral 12/25/2013    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   Chicken pox    Depression    hx of in past situational   Fibromyalgia    GERD (gastroesophageal reflux disease)    Headache    hx of migraines   History of kidney stones    1996    Hypertension    Pre-diabetes    Sleep apnea     Past Surgical History:  Procedure Laterality Date   ABDOMINAL HYSTERECTOMY     CHOLECYSTECTOMY     03-31-18 Derrell Lolling   CHOLECYSTECTOMY N/A 03/31/2018   Procedure: LAPAROSCOPIC CHOLECYSTECTOMY;  Surgeon: Axel Filler, MD;  Location: [facility];  Service: General;  Laterality: N/A;   COLONOSCOPY     endoplantar fasciotomy     GALLBLADDER SURGERY  03/31/2018   HIP ARTHROPLASTY Right 2023   KNEE ARTHROSCOPY     left knee 2012   LAPAROSCOPIC CHOLECYSTECTOMY     REPLACEMENT TOTAL KNEE BILATERAL Bilateral 09/27/2018   TONSILLECTOMY     1999   TOTAL KNEE ARTHROPLASTY Bilateral 09/27/2018   Procedure: TOTAL KNEE BILATERAL;  Surgeon: Gean Birchwood, MD;  Location: [facility];  Service: Orthopedics;  Laterality: Bilateral;   TRANSFORAMINAL LUMBAR INTERBODY FUSION (TLIF) WITH PEDICLE SCREW FIXATION 1 LEVEL Left 02/24/2023   Procedure: LEFT-SIDED LUMBAR 3- LUMBAR 4 TRANSFORAMINAL LUMBAR INTERBODY FUSION AND DECOMPRESSION WITH INSTRUMENTATION AND ALLOGRAFT;  Surgeon: Estill Bamberg, MD;  Location: [facility];  Service: Orthopedics;  Laterality: Left;   UPPER GASTROINTESTINAL ENDOSCOPY      Social History   Tobacco Use   Smoking status: Never   Smokeless tobacco: Never  Vaping Use   Vaping status: Never Used  Substance Use Topics   Alcohol  use: Yes    Comment: occ   Drug use: No    Family History  Problem Relation Age of Onset   Heart disease Father    Prostate cancer Father    Diabetes Father    Irritable bowel syndrome Father    Kidney disease Father    Mitral valve prolapse Mother    Heart disease Mother    Breast cancer Mother    Pancreatic cancer Maternal Aunt    Liver cancer Paternal Uncle    Colon cancer Paternal Uncle    Esophageal cancer Neg Hx    Stomach cancer Neg Hx    Rectal cancer Neg Hx     Allergies  Allergen Reactions   Aspartame Other (See Comments)    Headaches.   Demerol [Meperidine] Nausea And Vomiting     Medication list has been reviewed and updated.  Current Outpatient Medications on File Prior to Visit  Medication Sig Dispense Refill   ALPRAZolam (XANAX) 0.25 MG tablet Take 1 tablet (0.25 mg total) by mouth 2 (two) times daily as needed for anxiety. 30 tablet 1   amLODipine (NORVASC) 10 MG tablet Take 1 tablet (10 mg total) by mouth daily. 90 tablet 3   amoxicillin (AMOXIL) 500 MG tablet Take 4 tablets (2,000 mg total) by mouth See admin instructions. Take 2000 mg by mouth 2 hours prior to dental procedures. 12 tablet 1   amoxicillin-clavulanate (AUGMENTIN) 500-125 MG tablet Take 1 tablet by mouth 2 (two) times daily. 10 tablet 0   Ascorbic Acid (VITAMIN C) 1000 MG tablet Take 1,000 mg by mouth daily.     aspirin EC 81 MG tablet Take 1 tablet (81 mg total) by mouth 2 (two) times daily. (Patient taking differently: Take 81 mg by mouth at bedtime. Pt takes once daily at night) 60 tablet 0   Calcium Carb-Cholecalciferol 600-800 MG-UNIT TABS Take 1 tablet by mouth daily.     Cholecalciferol (VITAMIN D) 125 MCG (5000 UT) CAPS Take 5,000 Units by mouth daily.     cyclobenzaprine (FLEXERIL) 10 MG tablet TAKE 1 TABLET BY MOUTH AT BEDTIME AS NEEDED FOR MUSCLE SPASMS. 30 tablet 2   DULoxetine (CYMBALTA) 60 MG capsule Take 1 capsule (60 mg total) by mouth 2 (two) times daily. 180 capsule 1   gabapentin (NEURONTIN) 300 MG capsule Take 1 capsule (300 mg total) by mouth 3 (three) times daily. 270 capsule 1   hydrochlorothiazide (MICROZIDE) 12.5 MG capsule Take 1 capsule (12.5 mg total) by mouth daily. 90 capsule 3   icosapent Ethyl (VASCEPA) 1 g capsule TAKE 2 CAPSULES BY MOUTH 2 TIMES DAILY. 360 capsule 1   loratadine (CLARITIN) 10 MG tablet Take by mouth daily as needed for allergies.     omeprazole (PRILOSEC) 20 MG capsule TAKE 1 CAPSULE BY MOUTH EVERY DAY 90 capsule 1   rosuvastatin (CRESTOR) 20 MG tablet Take 1 tablet (20 mg total) by mouth daily. 90 tablet 3   traMADol (ULTRAM) 50 MG tablet Take  1 tablet (50 mg total) by mouth every 12 (twelve) hours as needed. 30 tablet 0   No current facility-administered medications on file prior to visit.    Review of Systems:  As per HPI- otherwise negative.   Physical Examination: There were no vitals filed for this visit. There were no vitals filed for this visit. There is no height or weight on file to calculate BMI. Ideal Body Weight:    GEN: no acute distress. HEENT: Atraumatic, Normocephalic.  Ears and Nose:  No external deformity. CV: RRR, No M/G/R. No JVD. No thrill. No extra heart sounds. PULM: CTA B, no wheezes, crackles, rhonchi. No retractions. No resp. distress. No accessory muscle use. ABD: S, NT, ND, +BS. No rebound. No HSM. EXTR: No c/c/e PSYCH: Normally interactive. Conversant.    Assessment and Plan: *** Physical exam today.  Encouraged healthy diet and exercise routine Will plan further follow- up pending labs.  Signed Abbe Amsterdam, MD

## 2023-10-03 ENCOUNTER — Ambulatory Visit (INDEPENDENT_AMBULATORY_CARE_PROVIDER_SITE_OTHER): Payer: Managed Care, Other (non HMO) | Admitting: Family Medicine

## 2023-10-03 VITALS — BP 122/82 | HR 75 | Temp 98.2°F | Resp 18 | Ht 66.0 in | Wt 275.2 lb

## 2023-10-03 DIAGNOSIS — M797 Fibromyalgia: Secondary | ICD-10-CM | POA: Diagnosis not present

## 2023-10-03 DIAGNOSIS — I10 Essential (primary) hypertension: Secondary | ICD-10-CM | POA: Diagnosis not present

## 2023-10-03 DIAGNOSIS — Z0000 Encounter for general adult medical examination without abnormal findings: Secondary | ICD-10-CM | POA: Diagnosis not present

## 2023-10-03 DIAGNOSIS — E781 Pure hyperglyceridemia: Secondary | ICD-10-CM

## 2023-10-03 DIAGNOSIS — R7303 Prediabetes: Secondary | ICD-10-CM

## 2023-10-03 DIAGNOSIS — Z1329 Encounter for screening for other suspected endocrine disorder: Secondary | ICD-10-CM

## 2023-10-03 LAB — COMPREHENSIVE METABOLIC PANEL
ALT: 59 U/L — ABNORMAL HIGH (ref 0–35)
AST: 89 U/L — ABNORMAL HIGH (ref 0–37)
Albumin: 4.3 g/dL (ref 3.5–5.2)
Alkaline Phosphatase: 69 U/L (ref 39–117)
BUN: 17 mg/dL (ref 6–23)
CO2: 26 meq/L (ref 19–32)
Calcium: 9.1 mg/dL (ref 8.4–10.5)
Chloride: 105 meq/L (ref 96–112)
Creatinine, Ser: 0.79 mg/dL (ref 0.40–1.20)
GFR: 83.12 mL/min (ref >60.00)
Glucose, Bld: 105 mg/dL — ABNORMAL HIGH (ref 70–99)
Potassium: 3.4 meq/L — ABNORMAL LOW (ref 3.5–5.1)
Sodium: 143 meq/L (ref 135–145)
Total Bilirubin: 0.9 mg/dL (ref 0.2–1.2)
Total Protein: 6.8 g/dL (ref 6.0–8.3)

## 2023-10-03 LAB — LIPID PANEL
Cholesterol: 108 mg/dL (ref 0–200)
HDL: 45.8 mg/dL (ref >39.00)
LDL Cholesterol: 27 mg/dL (ref 0–99)
NonHDL: 62.58
Total CHOL/HDL Ratio: 2
Triglycerides: 180 mg/dL — ABNORMAL HIGH (ref 0.0–149.0)
VLDL: 36 mg/dL (ref 0.0–40.0)

## 2023-10-03 LAB — TSH: TSH: 2.5 u[IU]/mL (ref 0.35–5.50)

## 2023-10-03 MED ORDER — ROSUVASTATIN CALCIUM 20 MG PO TABS
20.0000 mg | ORAL_TABLET | Freq: Every day | ORAL | 3 refills | Status: AC
Start: 2023-10-03 — End: ?

## 2023-10-03 MED ORDER — AMLODIPINE BESYLATE 10 MG PO TABS: 10.0000 mg | ORAL_TABLET | Freq: Every day | ORAL | 3 refills | Status: DC

## 2023-10-03 MED ORDER — DULOXETINE HCL 60 MG PO CPEP: 60.0000 mg | ORAL_CAPSULE | Freq: Two times a day (BID) | ORAL | 3 refills | Status: DC

## 2023-10-03 MED ORDER — GABAPENTIN 300 MG PO CAPS: 300.0000 mg | ORAL_CAPSULE | Freq: Three times a day (TID) | ORAL | 1 refills | Status: DC

## 2023-10-03 NOTE — Patient Instructions (Signed)
It was good to see you again today, I will be in touch with your lab work soon as possible  Please let me know with integrative doctor has to say  I will work on getting your and your husband's colonoscopy reports for your chart  Recommend getting your shingles vaccine once you get through Thanksgiving

## 2023-10-04 ENCOUNTER — Encounter: Payer: Self-pay | Admitting: Family Medicine

## 2023-10-04 LAB — CBC
HCT: 44.7 % (ref 36.0–46.0)
Hemoglobin: 14.7 g/dL (ref 12.0–15.0)
MCHC: 32.9 g/dL (ref 30.0–36.0)
MCV: 87.5 fL (ref 78.0–100.0)
Platelets: 264 10*3/uL (ref 150.0–400.0)
RBC: 5.11 Mil/uL (ref 3.87–5.11)
RDW: 16 % — ABNORMAL HIGH (ref 11.5–15.5)
WBC: 8.7 10*3/uL (ref 4.0–10.5)

## 2023-10-05 ENCOUNTER — Encounter: Payer: Self-pay | Admitting: Family Medicine

## 2023-10-05 ENCOUNTER — Other Ambulatory Visit: Payer: Self-pay | Admitting: Family Medicine

## 2023-10-05 DIAGNOSIS — R7401 Elevation of levels of liver transaminase levels: Secondary | ICD-10-CM

## 2023-10-05 DIAGNOSIS — E781 Pure hyperglyceridemia: Secondary | ICD-10-CM

## 2023-10-05 MED ORDER — ICOSAPENT ETHYL 1 G PO CAPS
ORAL_CAPSULE | ORAL | 1 refills | Status: DC
Start: 2023-10-05 — End: 2024-03-28

## 2023-10-05 NOTE — Telephone Encounter (Signed)
Rx has a note that reads: "Inpatient use is restricted to continuation of therapy from prior to admission. "  Okay to fill?

## 2023-10-09 ENCOUNTER — Encounter: Payer: Self-pay | Admitting: Family Medicine

## 2023-10-09 DIAGNOSIS — R7401 Elevation of levels of liver transaminase levels: Secondary | ICD-10-CM

## 2023-10-11 ENCOUNTER — Encounter: Payer: Self-pay | Admitting: Family Medicine

## 2023-10-11 DIAGNOSIS — R300 Dysuria: Secondary | ICD-10-CM

## 2023-10-13 ENCOUNTER — Ambulatory Visit (HOSPITAL_BASED_OUTPATIENT_CLINIC_OR_DEPARTMENT_OTHER)
Admission: RE | Admit: 2023-10-13 | Discharge: 2023-10-13 | Disposition: A | Discharge status: Home / Self Care | Payer: Managed Care, Other (non HMO) | Source: Ambulatory Visit | Attending: Family Medicine | Admitting: Family Medicine

## 2023-10-13 DIAGNOSIS — R7401 Elevation of levels of liver transaminase levels: Secondary | ICD-10-CM | POA: Insufficient documentation

## 2023-10-16 ENCOUNTER — Encounter: Payer: Self-pay | Admitting: Family Medicine

## 2023-10-30 ENCOUNTER — Other Ambulatory Visit: Payer: Self-pay | Admitting: Family Medicine

## 2023-11-04 ENCOUNTER — Ambulatory Visit: Payer: Managed Care, Other (non HMO)

## 2023-11-18 ENCOUNTER — Ambulatory Visit: Payer: Managed Care, Other (non HMO) | Admitting: Family Medicine

## 2023-11-18 ENCOUNTER — Encounter: Payer: Self-pay | Admitting: Family Medicine

## 2023-11-18 VITALS — BP 128/66 | HR 65 | Temp 97.7°F | Ht 66.0 in | Wt 268.0 lb

## 2023-11-18 DIAGNOSIS — J069 Acute upper respiratory infection, unspecified: Secondary | ICD-10-CM

## 2023-11-18 DIAGNOSIS — R051 Acute cough: Secondary | ICD-10-CM

## 2023-11-18 LAB — POC COVID19 BINAXNOW: SARS Coronavirus 2 Ag: NEGATIVE

## 2023-11-18 LAB — POCT INFLUENZA A/B
Influenza A, POC: NEGATIVE
Influenza B, POC: NEGATIVE

## 2023-11-18 MED ORDER — HYDROCOD POLI-CHLORPHE POLI ER 10-8 MG/5ML PO SUER: 5.0000 mL | Freq: Two times a day (BID) | ORAL | 0 refills | Status: AC | PRN

## 2023-11-18 MED ORDER — AMOXICILLIN-POT CLAVULANATE 875-125 MG PO TABS: 1.0000 | ORAL_TABLET | Freq: Two times a day (BID) | ORAL | 0 refills | Status: DC

## 2023-11-18 MED ORDER — LIDOCAINE VISCOUS HCL 2 % MT SOLN: 15.0000 mL | OROMUCOSAL | 0 refills | Status: DC | PRN

## 2023-11-18 NOTE — Progress Notes (Signed)
 Acute Office Visit  Subjective:     Patient ID: Dawn Reynolds, female    DOB: 1966-02-04, 58 y.o.   MRN: [id number]  Chief Complaint  Patient presents with   Cough   Sore Throat    Patient is in today for URI symptoms.   Discussed the use of AI scribe software for clinical note transcription with the patient, who gave verbal consent to proceed.  History of Present Illness   The patient, a nurse by profession, presents with a three to four day history of a sore throat, cough, headache, and general malaise. They deny any fever or breathing difficulties. The patient reports a potential exposure to COVID-19 due to their work environment, which includes frequent visits to facilities and patient homes, one of which has had a COVID-19 outbreak. However, they have not had any direct contact with COVID-19 positive patients.  The patient's cough is described as sounding productive, but they deny any expectoration. The headache is thought to be sinus-related. They have been self-medicating with Mucinex and ibuprofen . The patient describes their throat as feeling like razor blades. Despite these symptoms, they express a desire to return to work as soon as possible.       All review of systems negative except what is listed in the HPI      Objective:    BP 128/66   Pulse 65   Temp 97.7 F (36.5 C) (Oral)   Ht 5' 6 (1.676 m)   Wt 268 lb (121.6 kg)   LMP 06/09/2015 (Approximate)   SpO2 95%   BMI 43.26 kg/m    Physical Exam Vitals reviewed.  Constitutional:      Appearance: She is well-developed.  HENT:     Head: Normocephalic and atraumatic.     Right Ear: A middle ear effusion is present.     Left Ear: A middle ear effusion is present.     Nose: Congestion and rhinorrhea present.     Mouth/Throat:     Mouth: Mucous membranes are moist.     Pharynx: Oropharynx is clear. Posterior oropharyngeal erythema present.     Tonsils: No tonsillar exudate or tonsillar abscesses.   Eyes:     Conjunctiva/sclera: Conjunctivae normal.  Cardiovascular:     Rate and Rhythm: Normal rate and regular rhythm.  Pulmonary:     Effort: Pulmonary effort is normal.     Breath sounds: Normal breath sounds. No wheezing, rhonchi or rales.  Musculoskeletal:     Cervical back: Normal range of motion and neck supple.  Lymphadenopathy:     Cervical: No cervical adenopathy.  Skin:    General: Skin is warm and dry.  Neurological:     Mental Status: She is alert.         Results for orders placed or performed in visit on 11/18/23  POC COVID-19 BinaxNow  Result Value Ref Range   SARS Coronavirus 2 Ag Negative Negative  POCT Influenza A/B  Result Value Ref Range   Influenza A, POC Negative Negative   Influenza B, POC Negative Negative        Assessment & Plan:   Problem List Items Addressed This Visit   None Visit Diagnoses       Acute cough    -  Primary   Relevant Medications   chlorpheniramine-HYDROcodone  (TUSSIONEX) 10-8 MG/5ML   Other Relevant Orders   POC COVID-19 BinaxNow (Completed)   POCT Influenza A/B (Completed)     Upper respiratory tract infection, unspecified type  Relevant Medications   amoxicillin -clavulanate (AUGMENTIN ) 875-125 MG tablet   chlorpheniramine-HYDROcodone  (TUSSIONEX) 10-8 MG/5ML   lidocaine  (XYLOCAINE ) 2 % solution      Likely Viral Upper Respiratory Infection  Continue supportive measures including rest, hydration, humidifier use, steam showers, warm compresses to sinuses, warm liquids with lemon and honey, and over-the-counter cough, cold, and analgesics as needed. If symptoms persist 8-10 days, become severe, or return after a few days of feeling better, then you can start the watch-and-wait Augmentin  I am sending you. Also sending in cough syrup and lidocaine  for your sore throat.      Meds ordered this encounter  Medications   amoxicillin -clavulanate (AUGMENTIN ) 875-125 MG tablet    Sig: Take 1 tablet by mouth 2  (two) times daily.    Dispense:  20 tablet    Refill:  0    Supervising Provider:   DOMENICA BLACKBIRD A [[id number]]   chlorpheniramine-HYDROcodone  (TUSSIONEX) 10-8 MG/5ML    Sig: Take 5 mLs by mouth every 12 (twelve) hours as needed for up to 5 days.    Dispense:  50 mL    Refill:  0    Supervising Provider:   DOMENICA BLACKBIRD A [[id number]]   lidocaine  (XYLOCAINE ) 2 % solution    Sig: Use as directed 15 mLs in the mouth or throat every 4 (four) hours as needed for mouth pain.    Dispense:  100 mL    Refill:  0    Supervising Provider:   DOMENICA BLACKBIRD A [[id number]]    Return if symptoms worsen or fail to improve.  Waddell KATHEE Mon, NP

## 2023-11-18 NOTE — Patient Instructions (Signed)
 Likely Viral Upper Respiratory Infection  Continue supportive measures including rest, hydration, humidifier use, steam showers, warm compresses to sinuses, warm liquids with lemon and honey, and over-the-counter cough, cold, and analgesics as needed. If symptoms persist 8-10 days, become severe, or return after a few days of feeling better, then you can start the watch-and-wait Augmentin  I am sending you. Also sending in cough syrup and lidocaine  for your sore throat.     Over the counter medications that may be helpful for symptoms:  Guaifenesin 1200 mg extended release tabs twice daily, with plenty of water  For cough and congestion Brand name: Mucinex   Pseudoephedrine 30 mg, one or two tabs every 4 to 6 hours For sinus congestion Brand name: Sudafed You must get this from the pharmacy counter.  Oxymetazoline nasal spray each morning, one spray in each nostril, for NO MORE THAN 3 days  For nasal and sinus congestion Brand name: Afrin Saline nasal spray or Saline Nasal Irrigation (Netti Pot, etc) 3-5 times a day For nasal and sinus congestion Brand names: Ocean or AYR Fluticasone nasal spray OR Mometasone nasal spray OR Triamcinolone Acetonide nasal spray - follow directions on the packaging For nasal and sinus congestion Brand name: Flonase, Nasonex, Nasacort Warm salt water  gargles  For sore throat Every few hours as needed Alternate ibuprofen  400-600 mg and acetaminophen  1000 mg every 6 hours For fever, body aches, headache Brand names: Motrin  or Advil  and Tylenol  Dextromethorphan 12-hour cough version 30 mg every 12 hours  For cough Brand name: Delsym Stop all other cold medications for now (Nyquil, Dayquil, Tylenol  Cold, Theraflu, etc) and other non-prescription cough/cold preparations. Many of these have the same ingredients listed above and could cause an overdose of medication.   Herbal treatments that have been shown to be helpful in some patients include: Vitamin C   1000 mg per day Zinc 100 mg per day Quercetin 25-500 mg twice a day Melatonin 5-10mg  at bedtime Honey Green Tea  General Instructions Allow your body to rest Drink PLENTY of fluids Typically, we are the most contagious 1-2 days before symptoms start through the first 2-3 days of most severe symptoms. Per CDC guidelines, you can return to school/work when symptoms have started to improve and you have been fever-free for 24 hours. However, recommend you continue extra precautions for the following 5 days (frequent hand hygiene, masking, covering coughs/sneezes, minimize exposure to immunocompromised individuals, etc).  If you develop severe shortness of breath, uncontrolled fevers, coughing up blood, confusion, chest pain, or signs of dehydration (such as significantly decreased urine amounts or dizziness with standing) please go to the nearest ER.

## 2024-01-10 ENCOUNTER — Encounter: Payer: Self-pay | Admitting: Family Medicine

## 2024-01-11 NOTE — Telephone Encounter (Signed)
 Looks like Card placed the referral for [employer]. Okay for this referral for Mikaiya?

## 2024-01-25 ENCOUNTER — Other Ambulatory Visit: Payer: Self-pay | Admitting: Family Medicine

## 2024-01-25 DIAGNOSIS — Z5181 Encounter for therapeutic drug level monitoring: Secondary | ICD-10-CM

## 2024-01-28 ENCOUNTER — Other Ambulatory Visit: Payer: Self-pay | Admitting: Family Medicine

## 2024-02-06 ENCOUNTER — Ambulatory Visit
Admission: RE | Admit: 2024-02-06 | Discharge: 2024-02-06 | Disposition: A | Discharge status: Home / Self Care | Source: Ambulatory Visit | Attending: Emergency Medicine | Admitting: Emergency Medicine

## 2024-02-06 VITALS — BP 128/84 | HR 69 | Temp 98.8°F | Resp 18

## 2024-02-06 DIAGNOSIS — B349 Viral infection, unspecified: Secondary | ICD-10-CM | POA: Diagnosis not present

## 2024-02-06 LAB — POC COVID19/FLU A&B COMBO
Covid Antigen, POC: NEGATIVE
Influenza A Antigen, POC: NEGATIVE
Influenza B Antigen, POC: NEGATIVE

## 2024-02-06 LAB — POCT RAPID STREP A (OFFICE): Rapid Strep A Screen: NEGATIVE

## 2024-02-06 NOTE — Discharge Instructions (Addendum)
 The strep test is negative.  The COVID and flu tests are negative.   Take Tylenol or ibuprofen as needed for fever or discomfort.  Take plain Mucinex as needed for congestion.  Rest and keep yourself hydrated.    Follow-up with your primary care provider if your symptoms are not improving.

## 2024-02-06 NOTE — ED Triage Notes (Addendum)
 Patient to Urgent Care with complaints of sore throat/ fevers/ headaches/ fatigue.  Reports symptoms started yesterday. Exposed to strep at work.  Taking tylenol.

## 2024-02-06 NOTE — ED Provider Notes (Signed)
 Dawn Reynolds    CSN: [id number] Arrival date & time: 02/06/24  1552      History   Chief Complaint Chief Complaint  Patient presents with   Sore Throat    Mild fever, sore throat, headache - Entered by patient    HPI Dawn Reynolds is a 58 y.o. female.  Patient presents with 1 day history of fever, fatigue, headache, sore throat, mild cough, diarrhea.  1 episode of diarrhea.  No vomiting.  She has been treating her symptoms with Tylenol.  No shortness of breath.  She reports exposure to strep.    The history is provided by the patient and medical records.    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   Chicken pox    Depression    hx of in past situational   Fibromyalgia    GERD (gastroesophageal reflux disease)    Headache    hx of migraines   History of kidney stones    1996   Hypertension    Pre-diabetes    Sleep apnea     Patient Active Problem List   Diagnosis Date Noted   Radiculopathy due to lumbar intervertebral disc disorder 02/24/2023   S/P TKR (total knee replacement), bilateral 09/27/2018   Bilateral primary osteoarthritis of knee 09/26/2018   Obesity (BMI 35.0-39.9 without comorbidity) 04/06/2018   High triglycerides 04/06/2018   Osteoarthritis of left knee 12/21/2017   Chest pain 07/09/2017   Fibromyalgia 07/09/2017   Hypertension 07/09/2017   Abnormal weight gain 01/31/2014   Empty sella syndrome (HCC) 01/31/2014   Prediabetes 01/31/2014   Neuropathy, peripheral 12/25/2013    Past Surgical History:  Procedure Laterality Date   ABDOMINAL HYSTERECTOMY     CHOLECYSTECTOMY     03-31-18 Derrell Lolling   CHOLECYSTECTOMY N/A 03/31/2018   Procedure: LAPAROSCOPIC CHOLECYSTECTOMY;  Surgeon: Axel Filler, MD;  Location: [facility];  Service: General;  Laterality: N/A;   COLONOSCOPY     endoplantar fasciotomy     GALLBLADDER SURGERY  03/31/2018   HIP ARTHROPLASTY Right 2023   KNEE  ARTHROSCOPY     left knee 2012   LAPAROSCOPIC CHOLECYSTECTOMY     REPLACEMENT TOTAL KNEE BILATERAL Bilateral 09/27/2018   TONSILLECTOMY     1999   TOTAL KNEE ARTHROPLASTY Bilateral 09/27/2018   Procedure: TOTAL KNEE BILATERAL;  Surgeon: Gean Birchwood, MD;  Location: [facility];  Service: Orthopedics;  Laterality: Bilateral;   TRANSFORAMINAL LUMBAR INTERBODY FUSION (TLIF) WITH PEDICLE SCREW FIXATION 1 LEVEL Left 02/24/2023   Procedure: LEFT-SIDED LUMBAR 3- LUMBAR 4 TRANSFORAMINAL LUMBAR INTERBODY FUSION AND DECOMPRESSION WITH INSTRUMENTATION AND ALLOGRAFT;  Surgeon: Estill Bamberg, MD;  Location: [facility];  Service: Orthopedics;  Laterality: Left;   UPPER GASTROINTESTINAL ENDOSCOPY      OB History   No obstetric history on file.      Home Medications    Prior to Admission medications   Medication Sig Start Date End Date Taking? Authorizing Provider  ALPRAZolam (XANAX) 0.25 MG tablet Take 1 tablet (0.25 mg total) by mouth 2 (two) times daily as needed for anxiety. 07/25/23   Copland, Gwenlyn Found, MD  amLODipine (NORVASC) 10 MG tablet Take 1 tablet (10 mg total) by mouth daily. 10/03/23   Copland, Gwenlyn Found, MD  amoxicillin (AMOXIL) 500 MG tablet Take 4 tablets (2,000 mg total) by mouth See admin instructions. Take 2000 mg by mouth 2 hours  prior to dental procedures. Patient not taking: Reported on 11/18/2023 03/30/23   Copland, Gwenlyn Found, MD  amoxicillin-clavulanate (AUGMENTIN) 875-125 MG tablet Take 1 tablet by mouth 2 (two) times daily. 11/18/23   Clayborne Dana, NP  Ascorbic Acid (VITAMIN C) 1000 MG tablet Take 1,000 mg by mouth daily.    [provider]  aspirin EC 81 MG tablet Take 1 tablet (81 mg total) by mouth 2 (two) times daily. Patient taking differently: Take 81 mg by mouth at bedtime. Pt takes once daily at night 09/27/18   Allena Katz, PA-C  Calcium Carb-Cholecalciferol 600-800 MG-UNIT TABS Take 1 tablet by mouth daily.    [provider]  Cholecalciferol (VITAMIN  D) 125 MCG (5000 UT) CAPS Take 5,000 Units by mouth daily.    [provider]  cyclobenzaprine (FLEXERIL) 10 MG tablet TAKE 1 TABLET BY MOUTH AT BEDTIME AS NEEDED FOR MUSCLE SPASMS. 09/28/23   Copland, Gwenlyn Found, MD  DULoxetine (CYMBALTA) 60 MG capsule Take 1 capsule (60 mg total) by mouth 2 (two) times daily. 10/03/23   Copland, Gwenlyn Found, MD  gabapentin (NEURONTIN) 300 MG capsule Take 1 capsule (300 mg total) by mouth 3 (three) times daily. 10/03/23   Copland, Gwenlyn Found, MD  hydrochlorothiazide (MICROZIDE) 12.5 MG capsule Take 1 capsule (12.5 mg total) by mouth daily. 07/02/23   Copland, Gwenlyn Found, MD  icosapent Ethyl (VASCEPA) 1 g capsule TAKE 2 CAPSULES BY MOUTH 2 TIMES DAILY. 10/05/23   Copland, Gwenlyn Found, MD  lidocaine (XYLOCAINE) 2 % solution Use as directed 15 mLs in the mouth or throat every 4 (four) hours as needed for mouth pain. 11/18/23   Clayborne Dana, NP  loratadine (CLARITIN) 10 MG tablet Take by mouth daily as needed for allergies. 05/14/21   [provider]  meloxicam (MOBIC) 15 MG tablet Take 1 tablet (15 mg total) by mouth daily. Use as needed for pain 10/31/23   Copland, Gwenlyn Found, MD  omeprazole (PRILOSEC) 20 MG capsule TAKE 1 CAPSULE BY MOUTH EVERY DAY 01/30/24   Copland, Gwenlyn Found, MD  potassium chloride SA (KLOR-CON M) 20 MEQ tablet TAKE 1 TABLET (20 MEQ TOTAL) BY MOUTH DAILY. TAKE IF USING A DOUBLE DOSE OF DIURETIC FOR SWELLING 01/25/24   Copland, Gwenlyn Found, MD  rosuvastatin (CRESTOR) 20 MG tablet Take 1 tablet (20 mg total) by mouth daily. 10/03/23   Copland, Gwenlyn Found, MD  traMADol (ULTRAM) 50 MG tablet Take 1 tablet (50 mg total) by mouth every 12 (twelve) hours as needed. 02/28/23 02/28/24  Copland, Gwenlyn Found, MD    Family History Family History  Problem Relation Age of Onset   Heart disease Father    Prostate cancer Father    Diabetes Father    Irritable bowel syndrome Father    Kidney disease Father    Mitral valve prolapse Mother    Heart disease  Mother    Breast cancer Mother    Pancreatic cancer Maternal Aunt    Liver cancer Paternal Uncle    Colon cancer Paternal Uncle    Esophageal cancer Neg Hx    Stomach cancer Neg Hx    Rectal cancer Neg Hx     Social History Social History   Tobacco Use   Smoking status: Never   Smokeless tobacco: Never  Vaping Use   Vaping status: Never Used  Substance Use Topics   Alcohol use: Yes    Comment: occ   Drug use: No  Allergies   Aspartame and Demerol [meperidine]   Review of Systems Review of Systems  Constitutional:  Positive for fatigue and fever. Negative for chills.  HENT:  Positive for sore throat. Negative for ear pain.   Respiratory:  Positive for cough. Negative for shortness of breath.   Gastrointestinal:  Positive for diarrhea. Negative for vomiting.  Neurological:  Positive for headaches.     Physical Exam Triage Vital Signs ED Triage Vitals [02/06/24 1612]  Encounter Vitals Group     BP 128/84     Systolic BP Percentile      Diastolic BP Percentile      Pulse Rate 69     Resp 18     Temp 98.8 F (37.1 C)     Temp src      SpO2 95 %     Weight      Height      Head Circumference      Peak Flow      Pain Score      Pain Loc      Pain Education      Exclude from Growth Chart    No data found.  Updated Vital Signs BP 128/84   Pulse 69   Temp 98.8 F (37.1 C)   Resp 18   LMP 06/09/2015 (Approximate)   SpO2 95%   Visual Acuity Right Eye Distance:   Left Eye Distance:   Bilateral Distance:    Right Eye Near:   Left Eye Near:    Bilateral Near:     Physical Exam Constitutional:      General: She is not in acute distress. HENT:     Right Ear: Tympanic membrane normal.     Left Ear: Tympanic membrane normal.     Nose: Nose normal.     Mouth/Throat:     Mouth: Mucous membranes are moist.     Pharynx: Oropharynx is clear.  Cardiovascular:     Rate and Rhythm: Normal rate and regular rhythm.     Heart sounds: Normal heart  sounds.  Pulmonary:     Effort: Pulmonary effort is normal. No respiratory distress.     Breath sounds: Normal breath sounds.  Abdominal:     General: Bowel sounds are normal.     Palpations: Abdomen is soft.     Tenderness: There is no abdominal tenderness. There is no guarding or rebound.  Neurological:     Mental Status: She is alert.      UC Treatments / Results  Labs (all labs ordered are listed, but only abnormal results are displayed) Labs Reviewed  POC COVID19/FLU A&B COMBO  POCT RAPID STREP A (OFFICE)    EKG   Radiology No results found.  Procedures Procedures (including critical care time)  Medications Ordered in UC Medications - No data to display  Initial Impression / Assessment and Plan / UC Course  I have reviewed the triage vital signs and the nursing notes.  Pertinent labs & imaging results that were available during my care of the patient were reviewed by me and considered in my medical decision making (see chart for details).    Viral illness.  Rapid strep negative.  Rapid COVID and flu negative.  Discussed symptomatic treatment including Tylenol or ibuprofen as needed for fever or discomfort, plain Mucinex as needed for congestion, rest, hydration.  Instructed patient to follow-up with her PCP if not improving.  ED precautions given.  Patient agrees to plan of care.  Final  Clinical Impressions(s) / UC Diagnoses   Final diagnoses:  Viral illness     Discharge Instructions      The strep test is negative.  The COVID and flu tests are negative.   Take Tylenol or ibuprofen as needed for fever or discomfort.  Take plain Mucinex as needed for congestion.  Rest and keep yourself hydrated.    Follow-up with your primary care provider if your symptoms are not improving.           ED Prescriptions   None    PDMP not reviewed this encounter.   Mickie Bail, NP 02/06/24 [phone]

## 2024-02-28 ENCOUNTER — Ambulatory Visit
Admission: RE | Admit: 2024-02-28 | Discharge: 2024-02-28 | Disposition: A | Discharge status: Home / Self Care | Source: Ambulatory Visit | Attending: Family Medicine | Admitting: Family Medicine

## 2024-02-28 DIAGNOSIS — Z1231 Encounter for screening mammogram for malignant neoplasm of breast: Secondary | ICD-10-CM

## 2024-02-29 ENCOUNTER — Encounter: Payer: Self-pay | Admitting: Family Medicine

## 2024-03-04 NOTE — Progress Notes (Addendum)
 [facility] [address], [address] [place], [place] [address] [phone]  Date:  03/07/2024   Name:  Dawn Reynolds   DOB:  1966-03-30   MRN:  [id number]  PCP:  Watt Harlene BROCKS, MD    Chief Complaint: Weight Management Screening   History of Present Illness:  Dawn Reynolds is a 58 y.o. very pleasant female patient who presents with the following:  Pt seen today to discuss concerns about her weight Last seen by myself in November  History of hypertension, dyslipidemia, prediabetes, obesity and neuropathy, lumbar fusion 2024 She sent me the following message recently: You had mentioned wanting to repeat a liver panel this spring. I am really struggling with my weight and am concerned about diabetes. You had previously sent in a RX for Mounjaro . My insurance does not cover it so I've had to really debate the expense. I'm ready to spend the money but I'm nervous. I think it would be prudent to get a CMP as well as pancreatic enzymes and a current Hg A1C in addition to the liver panel before starting Mounjaro . Do you agree? I would certainly want all this to be monitored as I take it. I know it starts at a low dose and increases with time. I just want to do this the right way. Are you willing to monitor my health on this drug? Please let me know your thoughts.   Colon cancer screening - done in [facility] 2 years ago per her report   She notes sx from her overweight- she would like to lose weight for better mobility, exercise tolerance, and joint health She has been working hard on weight loss on her own: Diet - eating more lean meats, vegetables, decreased eating out, decreased fried foods, eating smaller meals (still waiting on gastroenterology appointment to see if I may have gastroparesis), drinking water  instead of tea   Exercise - just trying to walk regularly. Hurt my back at work so I've been off again.  Been going  to PT three times per week. Trying to walk at least 4x/week (would like to walk at least per day as my back continues to heal)  We discussed contraindication to GLP-1 in the past- she does not have No history of pancreatitis   Patient Active Problem List   Diagnosis Date Noted   Radiculopathy due to lumbar intervertebral disc disorder 02/24/2023   S/P TKR (total knee replacement), bilateral 09/27/2018   Bilateral primary osteoarthritis of knee 09/26/2018   Obesity (BMI 35.0-39.9 without comorbidity) 04/06/2018   High triglycerides 04/06/2018   Osteoarthritis of left knee 12/21/2017   Chest pain 07/09/2017   Fibromyalgia 07/09/2017   Hypertension 07/09/2017   Abnormal weight gain 01/31/2014   Empty sella syndrome (HCC) 01/31/2014   Prediabetes 01/31/2014   Neuropathy, peripheral 12/25/2013    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   Chicken pox    Depression    hx of in past situational   Fibromyalgia    GERD (gastroesophageal reflux disease)    Headache    hx of migraines   History of kidney stones    1996   Hypertension    Pre-diabetes    Sleep apnea     Past Surgical History:  Procedure Laterality Date   ABDOMINAL HYSTERECTOMY  CHOLECYSTECTOMY     03-31-18 Rubin   CHOLECYSTECTOMY N/A 03/31/2018   Procedure: LAPAROSCOPIC CHOLECYSTECTOMY;  Surgeon: Rubin Calamity, MD;  Location: [facility];  Service: General;  Laterality: N/A;   COLONOSCOPY     endoplantar fasciotomy     GALLBLADDER SURGERY  03/31/2018   HIP ARTHROPLASTY Right 2023   KNEE ARTHROSCOPY     left knee 2012   LAPAROSCOPIC CHOLECYSTECTOMY     REPLACEMENT TOTAL KNEE BILATERAL Bilateral 09/27/2018   TONSILLECTOMY     1999   TOTAL KNEE ARTHROPLASTY Bilateral 09/27/2018   Procedure: TOTAL KNEE BILATERAL;  Surgeon: Liam Lerner, MD;  Location: [facility];  Service: Orthopedics;  Laterality: Bilateral;   TRANSFORAMINAL LUMBAR  INTERBODY FUSION (TLIF) WITH PEDICLE SCREW FIXATION 1 LEVEL Left 02/24/2023   Procedure: LEFT-SIDED LUMBAR 3- LUMBAR 4 TRANSFORAMINAL LUMBAR INTERBODY FUSION AND DECOMPRESSION WITH INSTRUMENTATION AND ALLOGRAFT;  Surgeon: Beuford Anes, MD;  Location: [facility];  Service: Orthopedics;  Laterality: Left;   UPPER GASTROINTESTINAL ENDOSCOPY      Social History   Tobacco Use   Smoking status: Never   Smokeless tobacco: Never  Vaping Use   Vaping status: Never Used  Substance Use Topics   Alcohol use: Yes    Comment: occ   Drug use: No    Family History  Problem Relation Age of Onset   Heart disease Father    Prostate cancer Father    Diabetes Father    Irritable bowel syndrome Father    Kidney disease Father    Mitral valve prolapse Mother    Heart disease Mother    Breast cancer Mother    Pancreatic cancer Maternal Aunt    Liver cancer Paternal Uncle    Colon cancer Paternal Uncle    Esophageal cancer Neg Hx    Stomach cancer Neg Hx    Rectal cancer Neg Hx     Allergies  Allergen Reactions   Aspartame Other (See Comments)    Headaches.   Demerol  [Meperidine ] Nausea And Vomiting    Medication list has been reviewed and updated.  Current Outpatient Medications on File Prior to Visit  Medication Sig Dispense Refill   ALPRAZolam  (XANAX ) 0.25 MG tablet Take 1 tablet (0.25 mg total) by mouth 2 (two) times daily as needed for anxiety. 30 tablet 1   amLODipine  (NORVASC ) 10 MG tablet Take 1 tablet (10 mg total) by mouth daily. 90 tablet 3   Ascorbic Acid  (VITAMIN C ) 1000 MG tablet Take 1,000 mg by mouth daily.     aspirin  EC 81 MG tablet Take 1 tablet (81 mg total) by mouth 2 (two) times daily. (Patient taking differently: Take 81 mg by mouth at bedtime. Pt takes once daily at night) 60 tablet 0   Calcium  Carb-Cholecalciferol  600-800 MG-UNIT TABS Take 1 tablet by mouth daily.     Cholecalciferol  (VITAMIN D ) 125 MCG (5000 UT) CAPS Take 5,000 Units by mouth daily.      cyclobenzaprine  (FLEXERIL ) 10 MG tablet TAKE 1 TABLET BY MOUTH AT BEDTIME AS NEEDED FOR MUSCLE SPASMS. 30 tablet 2   DULoxetine  (CYMBALTA ) 60 MG capsule Take 1 capsule (60 mg total) by mouth 2 (two) times daily. 180 capsule 3   gabapentin  (NEURONTIN ) 300 MG capsule Take 1 capsule (300 mg total) by mouth 3 (three) times daily. 270 capsule 1   hydrochlorothiazide  (MICROZIDE ) 12.5 MG capsule Take 1 capsule (12.5 mg total) by mouth daily. 90 capsule 3   icosapent  Ethyl (VASCEPA ) 1 g capsule TAKE 2 CAPSULES BY  MOUTH 2 TIMES DAILY. 360 capsule 1   loratadine (CLARITIN) 10 MG tablet Take by mouth daily as needed for allergies.     meloxicam  (MOBIC ) 15 MG tablet Take 1 tablet (15 mg total) by mouth daily. Use as needed for pain 90 tablet 2   omeprazole  (PRILOSEC) 20 MG capsule TAKE 1 CAPSULE BY MOUTH EVERY DAY 90 capsule 1   potassium chloride  SA (KLOR-CON  M) 20 MEQ tablet TAKE 1 TABLET (20 MEQ TOTAL) BY MOUTH DAILY. TAKE IF USING A DOUBLE DOSE OF DIURETIC FOR SWELLING 90 tablet 1   rosuvastatin  (CRESTOR ) 20 MG tablet Take 1 tablet (20 mg total) by mouth daily. 90 tablet 3   lidocaine  (XYLOCAINE ) 2 % solution Use as directed 15 mLs in the mouth or throat every 4 (four) hours as needed for mouth pain. (Patient not taking: Reported on 03/07/2024) 100 mL 0   No current facility-administered medications on file prior to visit.    Review of Systems:  As per HPI- otherwise negative.   Physical Examination: Vitals:   03/07/24 1511  BP: 138/86  Pulse: 84  Resp: 18  Temp: 97.9 F (36.6 C)  SpO2: 95%   Vitals:   03/07/24 1511  Weight: 275 lb (124.7 kg)  Height: 5' 6 (1.676 m)   Body mass index is 44.39 kg/m. Ideal Body Weight: Weight in (lb) to have BMI = 25: 154.6  GEN: no acute distress.  Obese, looks well  HEENT: Atraumatic, Normocephalic.  Ears and Nose: No external deformity. CV: RRR, No M/G/R. No JVD. No thrill. No extra heart sounds. PULM: CTA B, no wheezes, crackles, rhonchi. No  retractions. No resp. distress. No accessory muscle use. ABD: S, NT, ND, +BS. No rebound. No HSM. EXTR: No c/c/e PSYCH: Normally interactive. Conversant.    Assessment and Plan: Morbid obesity (HCC)  Transaminitis - Plan: Comprehensive metabolic panel with GFR, Lipase  Prediabetes - Plan: Hemoglobin A1c, tirzepatide  (MOUNJARO ) 2.5 MG/0.5ML Pen  Patient seen today with concern of obesity.  She would like to try a GLP-1 drug for weight loss and does not have any contraindication.  However, insurance coverage and payment is a concern.  We were able to give her a sample box of Mounjaro  2.5 so she can try it and see how well she tolerates.  She will let me know how this goes.  We can then potentially move up her dosage as needed and tolerated  I did some lab work for her as above, we will be in touch with these results  She will keep me posted  Signed Harlene Schroeder, MD  Received labs as below, 5/1.  Message to patient  Results for orders placed or performed in visit on 03/07/24  Comprehensive metabolic panel with GFR   Collection Time: 03/07/24  3:44 PM  Result Value Ref Range   Sodium 142 135 - 145 mEq/L   Potassium 3.7 3.5 - 5.1 mEq/L   Chloride 105 96 - 112 mEq/L   CO2 25 19 - 32 mEq/L   Glucose, Bld 143 (H) 70 - 99 mg/dL   BUN 21 6 - 23 mg/dL   Creatinine, Ser 9.00 0.40 - 1.20 mg/dL   Total Bilirubin 1.0 0.2 - 1.2 mg/dL   Alkaline Phosphatase 70 39 - 117 U/L   AST 105 (H) 0 - 37 U/L   ALT 70 (H) 0 - 35 U/L   Total Protein 7.1 6.0 - 8.3 g/dL   Albumin 4.3 3.5 - 5.2 g/dL   GFR  63.21 >60.00 mL/min   Calcium  9.5 8.4 - 10.5 mg/dL  Hemoglobin J8r   Collection Time: 03/07/24  3:44 PM  Result Value Ref Range   Hgb A1c MFr Bld 6.7 (H) 4.6 - 6.5 %  Lipase   Collection Time: 03/07/24  3:44 PM  Result Value Ref Range   Lipase 41.0 11.0 - 59.0 U/L

## 2024-03-07 ENCOUNTER — Ambulatory Visit: Admitting: Family Medicine

## 2024-03-07 DIAGNOSIS — R7303 Prediabetes: Secondary | ICD-10-CM | POA: Diagnosis not present

## 2024-03-07 DIAGNOSIS — R7401 Elevation of levels of liver transaminase levels: Secondary | ICD-10-CM

## 2024-03-07 MED ORDER — TIRZEPATIDE 2.5 MG/0.5ML ~~LOC~~ SOAJ
2.5000 mg | SUBCUTANEOUS | Status: DC
Start: 2024-03-07 — End: 2024-04-04

## 2024-03-07 NOTE — Patient Instructions (Addendum)
 Good to see you today, I will be in touch with your labs as soon as possible.  If you like certainly okay to wait for your lab results prior to taking your first shot.  We prescribed Mounjaro  2.5 mg weekly today.  Please let me know how this goes and when you need more medication.  I suspect we may want to go up to 5 mg at your next fill  You might check out the [employer] program as a possible cost savings option- Zepbound  is the weight loss version of Mounjaro 

## 2024-03-08 ENCOUNTER — Encounter: Payer: Self-pay | Admitting: Family Medicine

## 2024-03-08 LAB — COMPREHENSIVE METABOLIC PANEL WITH GFR
ALT: 70 U/L — ABNORMAL HIGH (ref 0–35)
AST: 105 U/L — ABNORMAL HIGH (ref 0–37)
Albumin: 4.3 g/dL (ref 3.5–5.2)
Alkaline Phosphatase: 70 U/L (ref 39–117)
BUN: 21 mg/dL (ref 6–23)
CO2: 25 meq/L (ref 19–32)
Calcium: 9.5 mg/dL (ref 8.4–10.5)
Chloride: 105 meq/L (ref 96–112)
Creatinine, Ser: 0.99 mg/dL (ref 0.40–1.20)
GFR: 63.21 mL/min (ref >60.00)
Glucose, Bld: 143 mg/dL — ABNORMAL HIGH (ref 70–99)
Potassium: 3.7 meq/L (ref 3.5–5.1)
Sodium: 142 meq/L (ref 135–145)
Total Bilirubin: 1 mg/dL (ref 0.2–1.2)
Total Protein: 7.1 g/dL (ref 6.0–8.3)

## 2024-03-08 LAB — LIPASE: Lipase: 41 U/L (ref 11.0–59.0)

## 2024-03-08 LAB — HEMOGLOBIN A1C: Hgb A1c MFr Bld: 6.7 % — ABNORMAL HIGH (ref 4.6–6.5)

## 2024-03-08 NOTE — Addendum Note (Signed)
 Addended by: Gates Kasal C on: 03/08/2024 08:56 PM   Modules accepted: Orders

## 2024-03-12 ENCOUNTER — Other Ambulatory Visit

## 2024-03-15 ENCOUNTER — Other Ambulatory Visit (HOSPITAL_COMMUNITY): Payer: Self-pay

## 2024-03-15 ENCOUNTER — Telehealth: Payer: Self-pay

## 2024-03-15 NOTE — Telephone Encounter (Signed)
 Ozempic/Mounjaro  is approved exclusively as an adjunct to diet and exercise to improve glycemic control in adults with type 2 diabetes mellitus. A review of patient's medical chart reveals no documented diagnosis of type 2 diabetes or an A1C indicative of diabetes. Therefore, they do not currently meet the criteria for prior authorization of this medication. If clinically appropriate, alternative options such as Saxenda, Zepbound , or Frederik Jansky may be considered for this patient. Please advise

## 2024-03-21 ENCOUNTER — Encounter: Payer: Self-pay | Admitting: Family Medicine

## 2024-03-21 ENCOUNTER — Other Ambulatory Visit (INDEPENDENT_AMBULATORY_CARE_PROVIDER_SITE_OTHER)

## 2024-03-21 DIAGNOSIS — R7401 Elevation of levels of liver transaminase levels: Secondary | ICD-10-CM

## 2024-03-21 DIAGNOSIS — R300 Dysuria: Secondary | ICD-10-CM

## 2024-03-21 DIAGNOSIS — K219 Gastro-esophageal reflux disease without esophagitis: Secondary | ICD-10-CM

## 2024-03-21 DIAGNOSIS — R7303 Prediabetes: Secondary | ICD-10-CM

## 2024-03-21 LAB — HEPATIC FUNCTION PANEL
ALT: 56 U/L — ABNORMAL HIGH (ref 0–35)
AST: 82 U/L — ABNORMAL HIGH (ref 0–37)
Albumin: 4.4 g/dL (ref 3.5–5.2)
Alkaline Phosphatase: 68 U/L (ref 39–117)
Bilirubin, Direct: 0.3 mg/dL (ref 0.0–0.3)
Total Bilirubin: 1.3 mg/dL — ABNORMAL HIGH (ref 0.2–1.2)
Total Protein: 6.9 g/dL (ref 6.0–8.3)

## 2024-03-21 LAB — HEMOGLOBIN A1C: Hgb A1c MFr Bld: 6.6 % — ABNORMAL HIGH (ref 4.6–6.5)

## 2024-03-22 ENCOUNTER — Ambulatory Visit: Payer: Self-pay | Admitting: Family Medicine

## 2024-03-22 LAB — HEPATITIS PANEL, ACUTE
Hep A IgM: NONREACTIVE
Hep B C IgM: NONREACTIVE
Hepatitis B Surface Ag: NONREACTIVE
Hepatitis C Ab: NONREACTIVE

## 2024-03-22 LAB — URINE CULTURE
MICRO NUMBER:: 16454576
SPECIMEN QUALITY:: ADEQUATE

## 2024-03-22 NOTE — Addendum Note (Signed)
 Addended by: Kaylee Partridge on: 03/22/2024 08:43 AM   Modules accepted: Orders

## 2024-03-28 ENCOUNTER — Other Ambulatory Visit: Payer: Self-pay | Admitting: Family Medicine

## 2024-03-28 DIAGNOSIS — E781 Pure hyperglyceridemia: Secondary | ICD-10-CM

## 2024-04-03 ENCOUNTER — Other Ambulatory Visit: Payer: Self-pay | Admitting: Orthopedic Surgery

## 2024-04-03 DIAGNOSIS — M5416 Radiculopathy, lumbar region: Secondary | ICD-10-CM

## 2024-04-04 ENCOUNTER — Encounter: Payer: Self-pay | Admitting: Family Medicine

## 2024-04-04 DIAGNOSIS — E118 Type 2 diabetes mellitus with unspecified complications: Secondary | ICD-10-CM

## 2024-04-04 MED ORDER — TIRZEPATIDE 5 MG/0.5ML ~~LOC~~ SOAJ
5.0000 mg | SUBCUTANEOUS | 1 refills | Status: DC
Start: 2024-04-04 — End: 2024-04-09

## 2024-04-05 ENCOUNTER — Other Ambulatory Visit (HOSPITAL_COMMUNITY): Payer: Self-pay

## 2024-04-05 ENCOUNTER — Telehealth: Payer: Self-pay

## 2024-04-05 DIAGNOSIS — E118 Type 2 diabetes mellitus with unspecified complications: Secondary | ICD-10-CM

## 2024-04-05 NOTE — Telephone Encounter (Signed)
 Pharmacy Patient Advocate Encounter  Received notification from [employer] that Prior Authorization for MOUNJARO  5MG /0.5ML PEN has been DENIED.  Full denial letter will be uploaded to the media tab. See denial reason below.   PA #/Case ID/Reference #: [id number]

## 2024-04-05 NOTE — Telephone Encounter (Signed)
 Pharmacy Patient Advocate Encounter   Received notification from CoverMyMeds that prior authorization for Mounjaro  5 is required/requested.   Insurance verification completed.   The patient is insured through [employer] .   Per test claim: PA required; PA submitted to above mentioned insurance via CoverMyMeds Key/confirmation #/EOC [id number] Status is pending

## 2024-04-06 ENCOUNTER — Other Ambulatory Visit (HOSPITAL_COMMUNITY): Payer: Self-pay

## 2024-04-09 MED ORDER — TIRZEPATIDE 5 MG/0.5ML ~~LOC~~ SOAJ: 5.0000 mg | SUBCUTANEOUS | 1 refills | Status: DC

## 2024-04-09 NOTE — Addendum Note (Signed)
 Addended by: Gates Kasal C on: 04/09/2024 01:37 PM   Modules accepted: Orders

## 2024-04-11 ENCOUNTER — Ambulatory Visit
Admission: RE | Admit: 2024-04-11 | Discharge: 2024-04-11 | Disposition: A | Discharge status: Home / Self Care | Payer: Worker's Compensation | Source: Ambulatory Visit | Attending: Orthopedic Surgery | Admitting: Orthopedic Surgery

## 2024-04-11 DIAGNOSIS — M5416 Radiculopathy, lumbar region: Secondary | ICD-10-CM

## 2024-04-20 ENCOUNTER — Other Ambulatory Visit: Payer: Self-pay | Admitting: Orthopedic Surgery

## 2024-04-20 ENCOUNTER — Other Ambulatory Visit: Payer: Self-pay | Admitting: Family Medicine

## 2024-04-20 DIAGNOSIS — M533 Sacrococcygeal disorders, not elsewhere classified: Secondary | ICD-10-CM

## 2024-04-23 ENCOUNTER — Encounter: Payer: Self-pay | Admitting: Physician Assistant

## 2024-04-30 ENCOUNTER — Ambulatory Visit
Admission: RE | Admit: 2024-04-30 | Discharge: 2024-04-30 | Disposition: A | Discharge status: Home / Self Care | Payer: Worker's Compensation | Source: Ambulatory Visit | Attending: Orthopedic Surgery | Admitting: Orthopedic Surgery

## 2024-04-30 DIAGNOSIS — M533 Sacrococcygeal disorders, not elsewhere classified: Secondary | ICD-10-CM

## 2024-04-30 MED ORDER — METHYLPREDNISOLONE ACETATE 40 MG/ML INJ SUSP (RADIOLOG
80.0000 mg | Freq: Once | INTRAMUSCULAR | Status: AC
  Administered 2024-04-30: 80 mg via INTRA_ARTICULAR

## 2024-05-03 ENCOUNTER — Telehealth: Payer: Self-pay

## 2024-05-03 ENCOUNTER — Other Ambulatory Visit (HOSPITAL_COMMUNITY): Payer: Self-pay

## 2024-05-03 NOTE — Telephone Encounter (Signed)
 Patient has a current A1C of 6.8 which shows diabetes.  Insurance companies are becoming increasingly stricter about requiring thorough documentation of lifestyle modifications in the patient's chart at each visit. This includes detailed records of diet recommendations (caloric intake, etc), exercise plans (amount of time/wk, etc), and an emphasis on the patient's commitment to continuing these efforts while on medication.  Without this additional documentation in the chart notes, a prior authorization will most likely be denied.   Per patent insurance we need a diagnosis of diabetes with chart notes regarding treatment plan. Please advise

## 2024-05-04 NOTE — Telephone Encounter (Signed)
 Pharmacy Patient Advocate Encounter   Received notification from Pt Calls Messages that prior authorization for Mounjaro  2.5 is required/requested.   Insurance verification completed.   The patient is insured through [employer] .   Per test claim: PA required; PA submitted to above mentioned insurance via CoverMyMeds Key/confirmation #/EOC [id number] Status is pending

## 2024-05-07 ENCOUNTER — Other Ambulatory Visit (HOSPITAL_COMMUNITY): Payer: Self-pay

## 2024-05-07 NOTE — Telephone Encounter (Signed)
 Pharmacy Patient Advocate Encounter  Received notification from [employer] that Prior Authorization for Mounjaro  has been APPROVED from 04/20/24 to 05/04/25. Ran test claim, Copay is $25.00. This test claim was processed through [facility]- copay amounts may vary at other pharmacies due to pharmacy/plan contracts, or as the patient moves through the different stages of their insurance plan.   PA #/Case ID/Reference #: [id number]

## 2024-05-13 NOTE — Progress Notes (Unsigned)
 [facility] [address] [place], [place] [address] [phone]  Date:  05/16/2024   Name:  Dawn Reynolds   DOB:  13-Apr-1966   MRN:  [id number]  PCP:  Watt Harlene BROCKS, MD    Chief Complaint: No chief complaint on file.   History of Present Illness:  Dawn Reynolds is a 58 y.o. very pleasant female patient who presents with the following:  Patient seen today with concern of possible adverse effect of GLP-1  We got her started on Mounjaro  after our visit at the end of April-we increased from 2.5 to 5 mg about a month ago  Patient Active Problem List   Diagnosis Date Noted   Radiculopathy due to lumbar intervertebral disc disorder 02/24/2023   S/P TKR (total knee replacement), bilateral 09/27/2018   Bilateral primary osteoarthritis of knee 09/26/2018   Obesity (BMI 35.0-39.9 without comorbidity) 04/06/2018   High triglycerides 04/06/2018   Osteoarthritis of left knee 12/21/2017   Chest pain 07/09/2017   Fibromyalgia 07/09/2017   Hypertension 07/09/2017   Abnormal weight gain 01/31/2014   Empty sella syndrome (HCC) 01/31/2014   Controlled diabetes mellitus type 2 with complications (HCC) 01/31/2014   Neuropathy, peripheral 12/25/2013    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   Chicken pox    Depression    hx of in past situational   Fibromyalgia    GERD (gastroesophageal reflux disease)    Headache    hx of migraines   History of kidney stones    1996   Hypertension    Pre-diabetes    Sleep apnea     Past Surgical History:  Procedure Laterality Date   ABDOMINAL HYSTERECTOMY     CHOLECYSTECTOMY     03-31-18 Rubin   CHOLECYSTECTOMY N/A 03/31/2018   Procedure: LAPAROSCOPIC CHOLECYSTECTOMY;  Surgeon: Rubin Calamity, MD;  Location: [facility];  Service: General;  Laterality: N/A;   COLONOSCOPY     endoplantar fasciotomy      GALLBLADDER SURGERY  03/31/2018   HIP ARTHROPLASTY Right 2023   KNEE ARTHROSCOPY     left knee 2012   LAPAROSCOPIC CHOLECYSTECTOMY     REPLACEMENT TOTAL KNEE BILATERAL Bilateral 09/27/2018   TONSILLECTOMY     1999   TOTAL KNEE ARTHROPLASTY Bilateral 09/27/2018   Procedure: TOTAL KNEE BILATERAL;  Surgeon: Liam Lerner, MD;  Location: [facility];  Service: Orthopedics;  Laterality: Bilateral;   TRANSFORAMINAL LUMBAR INTERBODY FUSION (TLIF) WITH PEDICLE SCREW FIXATION 1 LEVEL Left 02/24/2023   Procedure: LEFT-SIDED LUMBAR 3- LUMBAR 4 TRANSFORAMINAL LUMBAR INTERBODY FUSION AND DECOMPRESSION WITH INSTRUMENTATION AND ALLOGRAFT;  Surgeon: Beuford Anes, MD;  Location: [facility];  Service: Orthopedics;  Laterality: Left;   UPPER GASTROINTESTINAL ENDOSCOPY      Social History   Tobacco Use   Smoking status: Never   Smokeless tobacco: Never  Vaping Use   Vaping status: Never Used  Substance Use Topics   Alcohol use: Yes    Comment: occ   Drug use: No    Family History  Problem Relation Age of Onset   Heart disease Father    Prostate cancer Father    Diabetes Father    Irritable bowel syndrome Father    Kidney disease Father    Mitral valve prolapse Mother    Heart disease Mother    Breast  cancer Mother    Pancreatic cancer Maternal Aunt    Liver cancer Paternal Uncle    Colon cancer Paternal Uncle    Esophageal cancer Neg Hx    Stomach cancer Neg Hx    Rectal cancer Neg Hx     Allergies  Allergen Reactions   Aspartame Other (See Comments)    Headaches.   Demerol  [Meperidine ] Nausea And Vomiting    Medication list has been reviewed and updated.  Current Outpatient Medications on File Prior to Visit  Medication Sig Dispense Refill   ALPRAZolam  (XANAX ) 0.25 MG tablet Take 1 tablet (0.25 mg total) by mouth 2 (two) times daily as needed for anxiety. 30 tablet 1   amLODipine  (NORVASC ) 10 MG tablet Take 1 tablet (10 mg total) by mouth daily. 90 tablet 3   Ascorbic Acid  (VITAMIN C )  1000 MG tablet Take 1,000 mg by mouth daily.     aspirin  EC 81 MG tablet Take 1 tablet (81 mg total) by mouth 2 (two) times daily. (Patient taking differently: Take 81 mg by mouth at bedtime. Pt takes once daily at night) 60 tablet 0   Calcium  Carb-Cholecalciferol  600-800 MG-UNIT TABS Take 1 tablet by mouth daily.     Cholecalciferol  (VITAMIN D ) 125 MCG (5000 UT) CAPS Take 5,000 Units by mouth daily.     cyclobenzaprine  (FLEXERIL ) 10 MG tablet TAKE 1 TABLET BY MOUTH AT BEDTIME AS NEEDED FOR MUSCLE SPASMS. 30 tablet 2   DULoxetine  (CYMBALTA ) 60 MG capsule Take 1 capsule (60 mg total) by mouth 2 (two) times daily. 180 capsule 3   gabapentin  (NEURONTIN ) 300 MG capsule Take 1 capsule (300 mg total) by mouth 3 (three) times daily. 270 capsule 0   hydrochlorothiazide  (MICROZIDE ) 12.5 MG capsule Take 1 capsule (12.5 mg total) by mouth daily. 90 capsule 3   icosapent  Ethyl (VASCEPA ) 1 g capsule Take 2 capsules (2 g total) by mouth 2 (two) times daily. 360 capsule 1   lidocaine  (XYLOCAINE ) 2 % solution Use as directed 15 mLs in the mouth or throat every 4 (four) hours as needed for mouth pain. (Patient not taking: Reported on 03/07/2024) 100 mL 0   loratadine (CLARITIN) 10 MG tablet Take by mouth daily as needed for allergies.     meloxicam  (MOBIC ) 15 MG tablet Take 1 tablet (15 mg total) by mouth daily. Use as needed for pain 90 tablet 2   omeprazole  (PRILOSEC) 20 MG capsule TAKE 1 CAPSULE BY MOUTH EVERY DAY 90 capsule 1   potassium chloride  SA (KLOR-CON  M) 20 MEQ tablet TAKE 1 TABLET (20 MEQ TOTAL) BY MOUTH DAILY. TAKE IF USING A DOUBLE DOSE OF DIURETIC FOR SWELLING 90 tablet 1   rosuvastatin  (CRESTOR ) 20 MG tablet Take 1 tablet (20 mg total) by mouth daily. 90 tablet 3   tirzepatide  (MOUNJARO ) 5 MG/0.5ML Pen Inject 5 mg into the skin once a week. 2 mL 1   No current facility-administered medications on file prior to visit.    Review of Systems:  As per HPI- otherwise negative.   Physical  Examination: There were no vitals filed for this visit. There were no vitals filed for this visit. There is no height or weight on file to calculate BMI. Ideal Body Weight:    GEN: no acute distress. HEENT: Atraumatic, Normocephalic.  Ears and Nose: No external deformity. CV: RRR, No M/G/R. No JVD. No thrill. No extra heart sounds. PULM: CTA B, no wheezes, crackles, rhonchi. No retractions. No resp. distress. No accessory muscle  use. ABD: S, NT, ND, +BS. No rebound. No HSM. EXTR: No c/c/e PSYCH: Normally interactive. Conversant.    Assessment and Plan: ***  Signed Harlene Schroeder, MD

## 2024-05-16 ENCOUNTER — Ambulatory Visit (HOSPITAL_BASED_OUTPATIENT_CLINIC_OR_DEPARTMENT_OTHER)
Admission: RE | Admit: 2024-05-16 | Discharge: 2024-05-16 | Disposition: A | Discharge status: Home / Self Care | Source: Ambulatory Visit | Attending: Family Medicine | Admitting: Family Medicine

## 2024-05-16 ENCOUNTER — Ambulatory Visit: Admitting: Family Medicine

## 2024-05-16 VITALS — BP 128/70 | HR 86 | Temp 97.9°F | Ht 66.0 in | Wt 248.0 lb

## 2024-05-16 DIAGNOSIS — E118 Type 2 diabetes mellitus with unspecified complications: Secondary | ICD-10-CM

## 2024-05-16 DIAGNOSIS — R140 Abdominal distension (gaseous): Secondary | ICD-10-CM

## 2024-05-16 DIAGNOSIS — R197 Diarrhea, unspecified: Secondary | ICD-10-CM | POA: Diagnosis present

## 2024-05-16 DIAGNOSIS — Z7985 Long-term (current) use of injectable non-insulin antidiabetic drugs: Secondary | ICD-10-CM

## 2024-05-16 MED ORDER — WEGOVY 0.25 MG/0.5ML ~~LOC~~ SOAJ: 0.2500 mg | SUBCUTANEOUS | Status: DC

## 2024-05-16 NOTE — Patient Instructions (Signed)
 It was good to see you today, I am sorry you are not feeling well  I am afraid the Mounjaro  is probably too strong for you.  Lets have you stop taking this and give it a week or two so you can feel better.  Assuming you are feeling better you can then start on semaglutide  0.25 mg weekly.  I gave you a sample month to try, if you do well we can increase to 0.5 mg after a couple of weeks  I will be in touch with your labs and x-rays as soon as possible, please let me know if you are not feeling better or if you start getting worse

## 2024-05-17 ENCOUNTER — Encounter: Payer: Self-pay | Admitting: Family Medicine

## 2024-05-17 LAB — LIPID PANEL
Cholesterol: 97 mg/dL (ref 0–200)
HDL: 48.8 mg/dL (ref >39.00)
LDL Cholesterol: 20 mg/dL (ref 0–99)
NonHDL: 48.6
Total CHOL/HDL Ratio: 2
Triglycerides: 141 mg/dL (ref 0.0–149.0)
VLDL: 28.2 mg/dL (ref 0.0–40.0)

## 2024-05-17 LAB — COMPREHENSIVE METABOLIC PANEL WITH GFR
ALT: 52 U/L — ABNORMAL HIGH (ref 0–35)
AST: 43 U/L — ABNORMAL HIGH (ref 0–37)
Albumin: 4.5 g/dL (ref 3.5–5.2)
Alkaline Phosphatase: 63 U/L (ref 39–117)
BUN: 18 mg/dL (ref 6–23)
CO2: 28 meq/L (ref 19–32)
Calcium: 9.6 mg/dL (ref 8.4–10.5)
Chloride: 103 meq/L (ref 96–112)
Creatinine, Ser: 0.98 mg/dL (ref 0.40–1.20)
GFR: 63.9 mL/min (ref >60.00)
Glucose, Bld: 119 mg/dL — ABNORMAL HIGH (ref 70–99)
Potassium: 3.6 meq/L (ref 3.5–5.1)
Sodium: 143 meq/L (ref 135–145)
Total Bilirubin: 1.3 mg/dL — ABNORMAL HIGH (ref 0.2–1.2)
Total Protein: 7 g/dL (ref 6.0–8.3)

## 2024-05-17 LAB — CBC
HCT: 48.3 % — ABNORMAL HIGH (ref 36.0–46.0)
Hemoglobin: 16 g/dL — ABNORMAL HIGH (ref 12.0–15.0)
MCHC: 33 g/dL (ref 30.0–36.0)
MCV: 87.2 fl (ref 78.0–100.0)
Platelets: 278 K/uL (ref 150.0–400.0)
RBC: 5.55 Mil/uL — ABNORMAL HIGH (ref 3.87–5.11)
RDW: 15.4 % (ref 11.5–15.5)
WBC: 10.5 K/uL (ref 4.0–10.5)

## 2024-05-17 LAB — LIPASE: Lipase: 68 U/L — ABNORMAL HIGH (ref 11.0–59.0)

## 2024-05-19 ENCOUNTER — Encounter: Payer: Self-pay | Admitting: Family Medicine

## 2024-05-19 DIAGNOSIS — E118 Type 2 diabetes mellitus with unspecified complications: Secondary | ICD-10-CM

## 2024-06-11 MED ORDER — TIRZEPATIDE 2.5 MG/0.5ML ~~LOC~~ SOAJ: 2.5000 mg | SUBCUTANEOUS | 1 refills | Status: DC

## 2024-06-12 NOTE — Progress Notes (Unsigned)
 Ellouise Console, PA-C [address] [place], [place]  [address] Phone: [phone]   Gastroenterology Consultation  Referring Provider:     Watt Harlene BROCKS, MD Primary Care Physician:  Watt Harlene BROCKS, MD Primary Gastroenterologist:  Ellouise Console, PA-C / Norleen Kiang, MD  Reason for Consultation:     Chronic GERD, IBS, multiple GI symptoms        HPI:   Dawn Reynolds is a 58 y.o. y/o female referred for consultation & management  by Copland, Harlene BROCKS, MD.    Current Symptoms: Patient has multiple GI symptoms including generalized upper and lower abdominal pain, heartburn, belching, bloating, gas, decreased appetite, nausea, vomiting, constipation, and diarrhea.  History of irritable bowel syndrome and GERD.  She was started on Mounjaro  3 months ago which made her GI symptoms worse (especially on higher dose).  Diagnosed with GERD and IBS many years ago.  She was taken off Mounjaro .  Restarted low-dose Mounjaro  1 week ago and is tolerating that better.  She denies alarm symptoms such as rectal bleeding or anemia.  Currently takes Prilosec 20 Mg once daily. Abdominal x-ray 05/20/2023 showed constipation but no bowel obstruction.  Prior Cholecystectomy in 2019.  05/2021 EGD done in [place]: Medium hiatal hernia.  Normal esophagus, stomach and duodenum.  Biopsies negative for H. pylori.  She reports having last colonoscopy in 2023 done in [place] by Dr. Ashley.  Reportedly normal.  Patient has chronically elevated LFTs attributed to hepatic steatosis.  Most recent labs 05/16/2024 showed AST 43, ALT 52, total bilirubin 1.3, alk phos 63.  Normal Hgb 16.0.  Negative acute viral hepatitis A/B/C labs.    10/2023 RUQ abdominal ultrasound with elastography: Hepatic steatosis, prior cholecystectomy, low median K PA 2.3.  No fibrosis.  07/2018 EGD by Dr. Kiang: Small hiatal hernia, otherwise normal.  No evidence of Barrett's.  No biopsies.  07/2018 colonoscopy by Dr. Kiang: 1 small 6 mm  sessile serrated polyp removed.  Diverticulosis.  Difficult procedure due to extremely redundant colon and body habitus.  10-year repeat colonoscopy was recommended (due 07/2028).  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   Chicken pox    Depression    hx of in past situational   DM (diabetes mellitus) (HCC)    Fibromyalgia    Gallstones    GERD (gastroesophageal reflux disease)    Headache    hx of migraines   History of kidney stones    1996   HLD (hyperlipidemia)    Hypertension    IBS (irritable bowel syndrome)    Obesity    Pre-diabetes    Sleep apnea    CPAP but not using   Status post dilation of esophageal narrowing     Past Surgical History:  Procedure Laterality Date   ABDOMINAL HYSTERECTOMY     CHOLECYSTECTOMY N/A 03/31/2018   Procedure: LAPAROSCOPIC CHOLECYSTECTOMY;  Surgeon: Rubin Calamity, MD;  Location: [facility];  Service: General;  Laterality: N/A;   COLONOSCOPY     endoplantar fasciotomy Right    GALLBLADDER SURGERY  03/31/2018   HIP ARTHROPLASTY Right 2023   KNEE ARTHROSCOPY Left    left knee 2012   REPLACEMENT TOTAL KNEE BILATERAL Bilateral 09/27/2018   TONSILLECTOMY     1999   TOTAL KNEE ARTHROPLASTY Bilateral  09/27/2018   Procedure: TOTAL KNEE BILATERAL;  Surgeon: Liam Lerner, MD;  Location: [facility];  Service: Orthopedics;  Laterality: Bilateral;   TRANSFORAMINAL LUMBAR INTERBODY FUSION (TLIF) WITH PEDICLE SCREW FIXATION 1 LEVEL Left 02/24/2023   Procedure: LEFT-SIDED LUMBAR 3- LUMBAR 4 TRANSFORAMINAL LUMBAR INTERBODY FUSION AND DECOMPRESSION WITH INSTRUMENTATION AND ALLOGRAFT;  Surgeon: Beuford Anes, MD;  Location: [facility];  Service: Orthopedics;  Laterality: Left;   UPPER GASTROINTESTINAL ENDOSCOPY      Prior to Admission medications   Medication Sig Start Date End Date Taking? Authorizing Provider  ALPRAZolam  (XANAX ) 0.25 MG tablet  Take 1 tablet (0.25 mg total) by mouth 2 (two) times daily as needed for anxiety. 07/25/23   Copland, Harlene BROCKS, MD  amLODipine  (NORVASC ) 10 MG tablet Take 1 tablet (10 mg total) by mouth daily. 10/03/23   Copland, Harlene BROCKS, MD  Ascorbic Acid  (VITAMIN C ) 1000 MG tablet Take 1,000 mg by mouth daily.    [provider]  aspirin  EC 81 MG tablet Take 1 tablet (81 mg total) by mouth 2 (two) times daily. Patient taking differently: Take 81 mg by mouth at bedtime. Pt takes once daily at night 09/27/18   Orlando Camellia POUR, PA-C  Calcium  Carb-Cholecalciferol  600-800 MG-UNIT TABS Take 1 tablet by mouth daily.    [provider]  Cholecalciferol  (VITAMIN D ) 125 MCG (5000 UT) CAPS Take 5,000 Units by mouth daily.    [provider]  cyclobenzaprine  (FLEXERIL ) 10 MG tablet TAKE 1 TABLET BY MOUTH AT BEDTIME AS NEEDED FOR MUSCLE SPASMS. 09/28/23   Copland, Harlene BROCKS, MD  DULoxetine  (CYMBALTA ) 60 MG capsule Take 1 capsule (60 mg total) by mouth 2 (two) times daily. 10/03/23   Copland, Harlene BROCKS, MD  gabapentin  (NEURONTIN ) 300 MG capsule Take 1 capsule (300 mg total) by mouth 3 (three) times daily. 04/20/24   Copland, Harlene BROCKS, MD  hydrochlorothiazide  (MICROZIDE ) 12.5 MG capsule Take 1 capsule (12.5 mg total) by mouth daily. 07/02/23   Copland, Harlene BROCKS, MD  icosapent  Ethyl (VASCEPA ) 1 g capsule Take 2 capsules (2 g total) by mouth 2 (two) times daily. 03/28/24   Copland, Harlene BROCKS, MD  lidocaine  (XYLOCAINE ) 2 % solution Use as directed 15 mLs in the mouth or throat every 4 (four) hours as needed for mouth pain. Patient not taking: Reported on 03/07/2024 11/18/23   Almarie Birmingham B, NP  loratadine (CLARITIN) 10 MG tablet Take by mouth daily as needed for allergies. 05/14/21   [provider]  meloxicam  (MOBIC ) 15 MG tablet Take 1 tablet (15 mg total) by mouth daily. Use as needed for pain 10/31/23   Copland, Harlene BROCKS, MD  omeprazole  (PRILOSEC) 20 MG capsule TAKE 1 CAPSULE BY MOUTH EVERY  DAY 01/30/24   Copland, Jessica C, MD  potassium chloride  SA (KLOR-CON  M) 20 MEQ tablet TAKE 1 TABLET (20 MEQ TOTAL) BY MOUTH DAILY. TAKE IF USING A DOUBLE DOSE OF DIURETIC FOR SWELLING 01/25/24   Copland, Harlene BROCKS, MD  rosuvastatin  (CRESTOR ) 20 MG tablet Take 1 tablet (20 mg total) by mouth daily. 10/03/23   Copland, Harlene BROCKS, MD  tirzepatide  (MOUNJARO ) 2.5 MG/0.5ML Pen Inject 2.5 mg into the skin once a week. 06/11/24   Copland, Harlene BROCKS, MD    Family History  Problem Relation Age of Onset   Mitral valve prolapse Mother    Heart disease Mother    Breast cancer Mother    Heart disease Father    Prostate cancer Father    Diabetes  Father    Irritable bowel syndrome Father    Kidney disease Father    Celiac disease Brother    Crohn's disease Brother    Celiac disease Brother    Colon cancer Maternal Grandmother    Myasthenia gravis Maternal Grandmother    Hypertension Maternal Grandmother    Other Maternal Grandfather        bleeding disorder   Osteoporosis Maternal Grandfather    Stomach cancer Paternal Grandmother    Pancreatic cancer Maternal Aunt    Liver cancer Paternal Uncle    Colon cancer Paternal Uncle    Other Daughter        Dysautonomia orthostatic intolerance   Esophageal cancer Neg Hx    Rectal cancer Neg Hx      Social History   Tobacco Use   Smoking status: Never   Smokeless tobacco: Never  Vaping Use   Vaping status: Never Used  Substance Use Topics   Alcohol use: Yes    Comment: occ   Drug use: No    Allergies as of 06/13/2024 - Review Complete 06/13/2024  Allergen Reaction Noted   Aspartame Other (See Comments) 03/15/2018   Demerol  [meperidine ] Nausea And Vomiting 12/12/2016    Review of Systems:    All systems reviewed and negative except where noted in HPI.   Physical Exam:  BP 110/78 (BP Location: Left Arm, Patient Position: Sitting, Cuff Size: Large)   Pulse 76   Ht 5' 4.75 (1.645 m) Comment: height measured without shoes  Wt 253 lb 4  oz (114.9 kg)   LMP 06/09/2015 (Approximate)   BMI 42.47 kg/m  Patient's last menstrual period was 06/09/2015 (approximate).  General:   Alert,  Well-developed, well-nourished, obese, pleasant and cooperative in NAD Lungs:  Respirations even and unlabored.  Clear throughout to auscultation.   No wheezes, crackles, or rhonchi. No acute distress. Heart:  Regular rate and rhythm; no murmurs, clicks, rubs, or gallops. Abdomen:  Normal bowel sounds.  No bruits.  Soft, and obese without masses, hepatosplenomegaly or hernias noted.  Mild to moderate generalized tenderness throughout entire abdomen.  No guarding or rebound tenderness.    Neurologic:  Alert and oriented x3;  grossly normal neurologically. Psych:  Alert and cooperative. Normal mood and affect.  Imaging Studies: DG Abd Acute W/Chest Result Date: 05/19/2024 CLINICAL DATA:  Diarrhea.  Concern for constipation or obstruction. EXAM: DG ABDOMEN ACUTE WITH 1 VIEW CHEST COMPARISON:  Chest radiograph 12/21/2018 FINDINGS: The cardiomediastinal contours are unchanged. Stable aortic tortuosity. The lungs are clear. There is no free intra-abdominal air. No dilated bowel loops to suggest obstruction. Moderate stool within the ascending and proximal transverse colon, small volume of formed stool in the left colon. Redundant colon in the left upper quadrant. No abnormal rectal distention. No radiopaque calculi. No acute osseous abnormalities are seen. Lumbar fusion hardware. Right hip arthroplasty IMPRESSION: 1. No evidence of acute cardiopulmonary disease. 2. No bowel obstruction. Moderate stool in the ascending and proximal transverse colon. Electronically Signed   By: Andrea Gasman M.D.   On: 05/19/2024 14:32    Labs: CBC    Component Value Date/Time   WBC 10.5 05/16/2024 1552   RBC 5.55 (H) 05/16/2024 1552   HGB 16.0 (H) 05/16/2024 1552   HCT 48.3 (H) 05/16/2024 1552   PLT 278.0 05/16/2024 1552   MCV 87.2 05/16/2024 1552    CMP      Component Value Date/Time   NA 143 05/16/2024 1552   NA 141 05/21/2019 1116  K 3.6 05/16/2024 1552   CL 103 05/16/2024 1552   CO2 28 05/16/2024 1552   GLUCOSE 119 (H) 05/16/2024 1552   BUN 18 05/16/2024 1552   BUN 17 06/02/2020 0000   CREATININE 0.98 05/16/2024 1552   CALCIUM  9.6 05/16/2024 1552   PROT 7.0 05/16/2024 1552   PROT 6.9 05/21/2019 1116   ALBUMIN 4.5 05/16/2024 1552   ALBUMIN 4.7 05/21/2019 1116   AST 43 (H) 05/16/2024 1552   ALT 52 (H) 05/16/2024 1552   ALKPHOS 63 05/16/2024 1552   BILITOT 1.3 (H) 05/16/2024 1552   BILITOT 1.2 05/21/2019 1116   GFRNONAA >60 02/24/2023 0630   GFRAA 86 05/21/2019 1116    Assessment and Plan:   Dawn Reynolds is a 58 y.o. y/o female has been referred for:  1.  Chronic GERD - Stop Prilosec 20 Mg daily. - Start Rx pantoprazole  40 Mg once daily, #90, 3 refills. - GERD diet.  2.  Irritable bowel syndrome, constipation predominant - Gave samples of Linzess 145 mcg QD for 1 week.  If this dose works, then we will call in prescription. - If Linzess 145 does not work well, then we can increase or decrease dose. - Consider trial of dicyclomine in the future if abdominal pain persists. - Start OTC fiber supplement daily.  3.  Hepatic steatosis - Recommend a low-fat diet, regular exercise, and weight loss.  4.  Chronic nausea and vomiting - Continue low-dose Mounjaro .  If she is not able to tolerate low-dose, then she will need to discontinue GLP-1.  She could not tolerate the higher dose.  She has had previous cholecystectomy.  Follow up 6 weeks with TG.  Ellouise Console, PA-C

## 2024-06-13 ENCOUNTER — Ambulatory Visit: Payer: Self-pay | Admitting: Physician Assistant

## 2024-06-13 ENCOUNTER — Encounter: Payer: Self-pay | Admitting: Physician Assistant

## 2024-06-13 VITALS — BP 110/78 | HR 76 | Ht 64.75 in | Wt 253.2 lb

## 2024-06-13 DIAGNOSIS — R112 Nausea with vomiting, unspecified: Secondary | ICD-10-CM

## 2024-06-13 DIAGNOSIS — K581 Irritable bowel syndrome with constipation: Secondary | ICD-10-CM

## 2024-06-13 DIAGNOSIS — K760 Fatty (change of) liver, not elsewhere classified: Secondary | ICD-10-CM | POA: Diagnosis not present

## 2024-06-13 DIAGNOSIS — Z860100 Personal history of colon polyps, unspecified: Secondary | ICD-10-CM

## 2024-06-13 DIAGNOSIS — K219 Gastro-esophageal reflux disease without esophagitis: Secondary | ICD-10-CM | POA: Diagnosis not present

## 2024-06-13 MED ORDER — PANTOPRAZOLE SODIUM 40 MG PO TBEC: 40.0000 mg | DELAYED_RELEASE_TABLET | Freq: Every day | ORAL | 3 refills | Status: AC

## 2024-06-13 NOTE — Progress Notes (Signed)
 Noted

## 2024-06-13 NOTE — Patient Instructions (Addendum)
 For Acid Reflux / Belching: - Stop Prilosec (Omeprazole ) - Start Pantoprazole  40 mg once daily every morning, 30 minutes before breakfast. - You can also take OTC Pepcid 20mg  once daily every evening. - Recommend Lifestyle Modifications to prevent Acid Reflux.  Rec. Avoid coffee, sodas, peppermint, garlic, onions, alcohol, citrus fruits, chocolate, tomatoes, fatty and spicey foods.  Avoid eating 2-3 hours before bedtime.    For Constipation / IBS: - Umb:Djfeozd of Linzess 145 mcg QD for 1 week. Depending on your response, we can increase Linzess to 290mcg or lower the dose to 72mcg daily. Please let me know which dose works best, and then we can send in a prescription.    Please follow up sooner if symptoms increase or worsen  Due to recent changes in healthcare laws, you may see the results of your imaging and laboratory studies on MyChart before your provider has had a chance to review them.  We understand that in some cases there may be results that are confusing or concerning to you. Not all laboratory results come back in the same time frame and the provider may be waiting for multiple results in order to interpret others.  Please give us  48 hours in order for your provider to thoroughly review all the results before contacting the office for clarification of your results.   Thank you for trusting me with your gastrointestinal care!   Ellouise Console, PA-C _______________________________________________________  If your blood pressure at your visit was 140/90 or greater, please contact your primary care physician to follow up on this.  _______________________________________________________  If you are age 23 or older, your body mass index should be between 23-30. Your Body mass index is 42.47 kg/m. If this is out of the aforementioned range listed, please consider follow up with your Primary Care Provider.  If you are age 86 or younger, your body mass index should be between 19-25. Your  Body mass index is 42.47 kg/m. If this is out of the aformentioned range listed, please consider follow up with your Primary Care Provider.   ________________________________________________________  The [facility] providers would like to encourage you to use MYCHART to communicate with providers for non-urgent requests or questions.  Due to long hold times on the telephone, sending your provider a message by [facility] may be a faster and more efficient way to get a response.  Please allow 48 business hours for a response.  Please remember that this is for non-urgent requests.  _______________________________________________________

## 2024-06-21 ENCOUNTER — Other Ambulatory Visit: Payer: Self-pay

## 2024-06-21 MED ORDER — LINACLOTIDE 145 MCG PO CAPS: 145.0000 ug | ORAL_CAPSULE | Freq: Every day | ORAL | 3 refills | Status: DC

## 2024-07-03 ENCOUNTER — Encounter: Payer: Self-pay | Admitting: Family Medicine

## 2024-07-03 DIAGNOSIS — E118 Type 2 diabetes mellitus with unspecified complications: Secondary | ICD-10-CM

## 2024-07-04 MED ORDER — TIRZEPATIDE 5 MG/0.5ML ~~LOC~~ SOAJ
5.0000 mg | SUBCUTANEOUS | 1 refills | Status: DC
Start: 2024-07-04 — End: 2024-09-26

## 2024-07-04 NOTE — Addendum Note (Signed)
 Addended by: WATT RAISIN C on: 07/04/2024 12:47 PM   Modules accepted: Orders

## 2024-07-10 ENCOUNTER — Other Ambulatory Visit: Payer: Self-pay

## 2024-07-10 MED ORDER — LUBIPROSTONE 24 MCG PO CAPS: 24.0000 ug | ORAL_CAPSULE | Freq: Two times a day (BID) | ORAL | 3 refills | Status: DC

## 2024-07-20 ENCOUNTER — Other Ambulatory Visit: Payer: Self-pay | Admitting: Family Medicine

## 2024-07-20 DIAGNOSIS — I10 Essential (primary) hypertension: Secondary | ICD-10-CM

## 2024-07-25 ENCOUNTER — Encounter (HOSPITAL_BASED_OUTPATIENT_CLINIC_OR_DEPARTMENT_OTHER): Payer: Self-pay

## 2024-07-25 ENCOUNTER — Emergency Department (HOSPITAL_BASED_OUTPATIENT_CLINIC_OR_DEPARTMENT_OTHER)

## 2024-07-25 ENCOUNTER — Encounter: Payer: Self-pay | Admitting: Family Medicine

## 2024-07-25 ENCOUNTER — Other Ambulatory Visit: Payer: Self-pay

## 2024-07-25 ENCOUNTER — Emergency Department (HOSPITAL_BASED_OUTPATIENT_CLINIC_OR_DEPARTMENT_OTHER)
Admission: EM | Admit: 2024-07-25 | Discharge: 2024-07-25 | Disposition: A | Discharge status: Home / Self Care | Attending: Emergency Medicine | Admitting: Emergency Medicine

## 2024-07-25 DIAGNOSIS — E876 Hypokalemia: Secondary | ICD-10-CM | POA: Diagnosis not present

## 2024-07-25 DIAGNOSIS — Z7982 Long term (current) use of aspirin: Secondary | ICD-10-CM | POA: Diagnosis not present

## 2024-07-25 DIAGNOSIS — Z79899 Other long term (current) drug therapy: Secondary | ICD-10-CM | POA: Insufficient documentation

## 2024-07-25 DIAGNOSIS — I10 Essential (primary) hypertension: Secondary | ICD-10-CM | POA: Insufficient documentation

## 2024-07-25 DIAGNOSIS — R0789 Other chest pain: Secondary | ICD-10-CM | POA: Insufficient documentation

## 2024-07-25 DIAGNOSIS — E119 Type 2 diabetes mellitus without complications: Secondary | ICD-10-CM | POA: Insufficient documentation

## 2024-07-25 LAB — CBC
HCT: 46.8 % — ABNORMAL HIGH (ref 36.0–46.0)
Hemoglobin: 16.3 g/dL — ABNORMAL HIGH (ref 12.0–15.0)
MCH: 29.6 pg (ref 26.0–34.0)
MCHC: 34.8 g/dL (ref 30.0–36.0)
MCV: 84.9 fL (ref 80.0–100.0)
Platelets: 246 K/uL (ref 150–400)
RBC: 5.51 MIL/uL — ABNORMAL HIGH (ref 3.87–5.11)
RDW: 14.8 % (ref 11.5–15.5)
WBC: 10.5 K/uL (ref 4.0–10.5)
nRBC: 0 % (ref 0.0–0.2)

## 2024-07-25 LAB — BASIC METABOLIC PANEL WITH GFR
Anion gap: 17 — ABNORMAL HIGH (ref 5–15)
Anion gap: 14 (ref 5–15)
BUN: 16 mg/dL (ref 6–20)
BUN: 15 mg/dL (ref 6–20)
CO2: 19 mmol/L — ABNORMAL LOW (ref 22–32)
CO2: 22 mmol/L (ref 22–32)
Calcium: 9.4 mg/dL (ref 8.9–10.3)
Calcium: 8.6 mg/dL — ABNORMAL LOW (ref 8.9–10.3)
Chloride: 104 mmol/L (ref 98–111)
Chloride: 105 mmol/L (ref 98–111)
Creatinine, Ser: 1.02 mg/dL — ABNORMAL HIGH (ref 0.44–1.00)
Creatinine, Ser: 0.86 mg/dL (ref 0.44–1.00)
GFR, Estimated: >60 mL/min (ref >60)
GFR, Estimated: >60 mL/min (ref >60)
Glucose, Bld: 109 mg/dL — ABNORMAL HIGH (ref 70–99)
Glucose, Bld: 90 mg/dL (ref 70–99)
Potassium: 2.7 mmol/L — CL (ref 3.5–5.1)
Potassium: 2.9 mmol/L — ABNORMAL LOW (ref 3.5–5.1)
Sodium: 140 mmol/L (ref 135–145)
Sodium: 141 mmol/L (ref 135–145)

## 2024-07-25 LAB — TROPONIN T, HIGH SENSITIVITY
Troponin T High Sensitivity: <15 ng/L (ref 0–19)
Troponin T High Sensitivity: <15 ng/L (ref 0–19)

## 2024-07-25 LAB — D-DIMER, QUANTITATIVE: D-Dimer, Quant: 0.41 ug{FEU}/mL (ref 0.00–0.50)

## 2024-07-25 MED ORDER — MAGNESIUM OXIDE -MG SUPPLEMENT 400 (240 MG) MG PO TABS
400.0000 mg | ORAL_TABLET | Freq: Once | ORAL | Status: AC
  Administered 2024-07-25: 400 mg via ORAL
  Filled 2024-07-25: qty 1

## 2024-07-25 MED ORDER — LACTATED RINGERS IV BOLUS
1000.0000 mL | Freq: Once | INTRAVENOUS | Status: AC
  Administered 2024-07-25: 1000 mL via INTRAVENOUS

## 2024-07-25 MED ORDER — POTASSIUM CHLORIDE ER 10 MEQ PO TBCR: 40.0000 meq | EXTENDED_RELEASE_TABLET | Freq: Every day | ORAL | 0 refills | Status: DC

## 2024-07-25 MED ORDER — PANTOPRAZOLE SODIUM 40 MG PO TBEC
40.0000 mg | DELAYED_RELEASE_TABLET | Freq: Once | ORAL | Status: DC
  Filled 2024-07-25: qty 1

## 2024-07-25 MED ORDER — LIDOCAINE 5 % EX PTCH
1.0000 | MEDICATED_PATCH | Freq: Once | CUTANEOUS | Status: DC
  Administered 2024-07-25: 1 via TRANSDERMAL
  Filled 2024-07-25: qty 1

## 2024-07-25 MED ORDER — ALUM & MAG HYDROXIDE-SIMETH 200-200-20 MG/5ML PO SUSP
30.0000 mL | Freq: Once | ORAL | Status: DC
  Filled 2024-07-25: qty 30

## 2024-07-25 MED ORDER — ACETAMINOPHEN 500 MG PO TABS
1000.0000 mg | ORAL_TABLET | Freq: Once | ORAL | Status: AC
  Administered 2024-07-25: 1000 mg via ORAL
  Filled 2024-07-25: qty 2

## 2024-07-25 MED ORDER — KETOROLAC TROMETHAMINE 15 MG/ML IJ SOLN
15.0000 mg | Freq: Once | INTRAMUSCULAR | Status: AC
  Administered 2024-07-25: 15 mg via INTRAVENOUS
  Filled 2024-07-25: qty 1

## 2024-07-25 MED ORDER — POTASSIUM CHLORIDE CRYS ER 20 MEQ PO TBCR
40.0000 meq | EXTENDED_RELEASE_TABLET | Freq: Once | ORAL | Status: AC
  Administered 2024-07-25: 40 meq via ORAL
  Filled 2024-07-25: qty 2

## 2024-07-25 MED ORDER — POTASSIUM CHLORIDE 20 MEQ PO PACK
40.0000 meq | PACK | Freq: Once | ORAL | Status: DC
  Filled 2024-07-25: qty 2

## 2024-07-25 MED ORDER — POTASSIUM CHLORIDE 10 MEQ/100ML IV SOLN
10.0000 meq | Freq: Once | INTRAVENOUS | Status: AC
  Administered 2024-07-25: 10 meq via INTRAVENOUS
  Filled 2024-07-25: qty 100

## 2024-07-25 MED ORDER — LIDOCAINE VISCOUS HCL 2 % MT SOLN
15.0000 mL | Freq: Once | OROMUCOSAL | Status: AC
  Administered 2024-07-25: 15 mL via ORAL
  Filled 2024-07-25 (×2): qty 15

## 2024-07-25 NOTE — ED Provider Notes (Signed)
 [facility] Provider Note   CSN: [id number] Arrival date & time: 07/25/24  0608     History Chief Complaint  Patient presents with   Chest Pain   HPI: Dawn Reynolds is a 58 y.o. female with history perinent for T2DM on Mounjaro , fibromyalgia, elevated BMI, HTN, HLD, IBS who presents complaining of left-sided chest pain. Patient arrived via POV accompanied by husband, Dawn Reynolds.  History provided by patient and spouse/partner.  No interpreter required during this encounter.  Patient reports that she developed significant chest pain overnight.  Reports that she has had worsening GERD and recent weeks, therefore she is on a PPI, and has been sleeping on her side.  Reports that she had some left-sided discomfort, which she attributed to GERD and fibromyalgia, therefore went back to sleep, however reports that the discomfort awoke her from sleep at approximately 4:30 AM and was more severe.  Reports that she took 325 of aspirin  at home, and became concerned for her heart, therefore decided to come to the emergency department for further evaluation.  Does note that her father had a MI in his 70s.  Reports a history of IBS, and reports that she has had watery stools over the past several days, however denies any other sick symptoms such as fever, chills, nausea, vomiting.  Denies any history of prior blood clots.  Patient's recorded medical, surgical, social, medication list and allergies were reviewed in the Snapshot window as part of the initial history.   Prior to Admission medications   Medication Sig Start Date End Date Taking? Authorizing Provider  potassium chloride  (KLOR-CON ) 10 MEQ tablet Take 4 tablets (40 mEq total) by mouth daily for 5 days. 07/25/24 07/30/24 Yes Rogelia Jerilynn RAMAN, MD  ALPRAZolam  (XANAX ) 0.25 MG tablet Take 1 tablet (0.25 mg total) by mouth 2 (two) times daily as needed for anxiety. 07/25/23   Copland, Harlene BROCKS, MD  amLODipine   (NORVASC ) 10 MG tablet Take 1 tablet (10 mg total) by mouth daily. 10/03/23   Copland, Harlene BROCKS, MD  aspirin  EC 81 MG tablet Take 1 tablet (81 mg total) by mouth 2 (two) times daily. Patient taking differently: Take 81 mg by mouth at bedtime. Pt takes once daily at night 09/27/18   Orlando Camellia POUR, PA-C  cyclobenzaprine  (FLEXERIL ) 10 MG tablet TAKE 1 TABLET BY MOUTH AT BEDTIME AS NEEDED FOR MUSCLE SPASMS. 09/28/23   Copland, Harlene BROCKS, MD  DULoxetine  (CYMBALTA ) 60 MG capsule Take 1 capsule (60 mg total) by mouth 2 (two) times daily. 10/03/23   Copland, Harlene BROCKS, MD  gabapentin  (NEURONTIN ) 300 MG capsule Take 1 capsule (300 mg total) by mouth 3 (three) times daily. Patient not taking: Reported on 06/13/2024 04/20/24   Copland, Harlene BROCKS, MD  hydrochlorothiazide  (MICROZIDE ) 12.5 MG capsule Take 1 capsule (12.5 mg total) by mouth daily. 07/20/24   Copland, Harlene BROCKS, MD  icosapent  Ethyl (VASCEPA ) 1 g capsule Take 2 capsules (2 g total) by mouth 2 (two) times daily. 03/28/24   Copland, Harlene BROCKS, MD  lubiprostone  (AMITIZA ) 24 MCG capsule Take 1 capsule (24 mcg total) by mouth 2 (two) times daily with a meal. 07/10/24   [place], PA-C  pantoprazole  (PROTONIX ) 40 MG tablet Take 1 tablet (40 mg total) by mouth daily. 06/13/24   [place], PA-C  potassium chloride  SA (KLOR-CON  M) 20 MEQ tablet TAKE 1 TABLET (20 MEQ TOTAL) BY MOUTH DAILY. TAKE IF USING A DOUBLE DOSE OF DIURETIC FOR SWELLING  01/25/24   Copland, Harlene BROCKS, MD  rosuvastatin  (CRESTOR ) 20 MG tablet Take 1 tablet (20 mg total) by mouth daily. 10/03/23   Copland, Harlene BROCKS, MD  tirzepatide  (MOUNJARO ) 5 MG/0.5ML Pen Inject 5 mg into the skin once a week. 07/04/24   Copland, Harlene BROCKS, MD     Allergies: Aspartame and Demerol  [meperidine ]   Review of Systems   ROS as per HPI  Physical Exam Updated Vital Signs BP 112/72   Pulse 69   Temp 97.8 F (36.6 C) (Oral)   Resp (!) 23   Ht 5' 4 (1.626 m)   Wt 105.2 kg   LMP 06/09/2015  (Approximate)   SpO2 95%   BMI 39.82 kg/m  Physical Exam Vitals and nursing note reviewed.  Constitutional:      General: She is not in acute distress.    Appearance: She is well-developed.  HENT:     Head: Normocephalic and atraumatic.  Eyes:     Conjunctiva/sclera: Conjunctivae normal.  Cardiovascular:     Rate and Rhythm: Normal rate and regular rhythm.     Pulses:          Radial pulses are 2+ on the right side and 2+ on the left side.     Heart sounds: No murmur heard. Pulmonary:     Effort: Pulmonary effort is normal. No respiratory distress.     Breath sounds: Normal breath sounds.  Chest:     Chest wall: Tenderness (Tenderness to palpation percussion of the left sided anterior chest wall) present.  Abdominal:     Palpations: Abdomen is soft.     Tenderness: There is no abdominal tenderness.  Musculoskeletal:        General: No swelling.     Cervical back: Neck supple.     Right lower leg: No tenderness. No edema.     Left lower leg: No tenderness. No edema.  Skin:    General: Skin is warm and dry.     Capillary Refill: Capillary refill takes less than 2 seconds.  Neurological:     Mental Status: She is alert.  Psychiatric:        Mood and Affect: Mood normal.     ED Course/ Medical Decision Making/ A&P    Procedures Procedures   Medications Ordered in ED Medications  lactated ringers  bolus 1,000 mL (0 mLs Intravenous Stopped 07/25/24 0849)  potassium chloride  10 mEq in 100 mL IVPB (0 mEq Intravenous Stopped 07/25/24 0849)  ketorolac  (TORADOL ) 15 MG/ML injection 15 mg (15 mg Intravenous Given 07/25/24 0821)  potassium chloride  SA (KLOR-CON  M) CR tablet 40 mEq (40 mEq Oral Given 07/25/24 0821)  lidocaine  (XYLOCAINE ) 2 % viscous mouth solution 15 mL (15 mLs Oral Given 07/25/24 0921)  acetaminophen  (TYLENOL ) tablet 1,000 mg (1,000 mg Oral Given 07/25/24 0921)  potassium chloride  SA (KLOR-CON  M) CR tablet 40 mEq (40 mEq Oral Given 07/25/24 1156)  magnesium  oxide  (MAG-OX) tablet 400 mg (400 mg Oral Given 07/25/24 1156)    Medical Decision Making:   [name] is a 58 y.o. female who presents for chest pain as per above.  Physical exam is pertinent for tenderness to palpation and percussion of the left anterior chest wall  The differential includes but is not limited to ACS, arrhythmia, pericardial tamponade, pericarditis, myocarditis, pneumonia, pneumothorax, esophageal, tear, perforated abdominal viscous, pulmonary embolism, aortic dissection, costochondritis, musculoskeletal chest wall pain, GERD.  Independent historian: Spouse/partner  External data reviewed: No pertinent external data  Labs: Ordered,  Independent interpretation, and Details: Initial and delta troponin undetectable.  CBC without leukocytosis, anemia, thrombocytopenia.  Initial BMP with no AKI, hypokalemia at 2.7.  Mild anion gap elevation.  Repeat BMP with resolution of anion gap, improvement of potassium to 2.9  Radiology: Ordered, Independent interpretation, Details: Chest x-ray without focal airspace opacification, cardiomediastinal silhouette derangement, pneumothorax, pleural effusion, bony derangement, and All images reviewed independently.  Agree with radiology report at this time.   DG Chest 2 View Result Date: 07/25/2024 CLINICAL DATA:  Chest pain and left arm discomfort.  Chest pressure. EXAM: CHEST - 2 VIEW COMPARISON:  05/16/2024 FINDINGS: The cardiopericardial silhouette is within normal limits for size. The lungs are clear without focal pneumonia, edema, pneumothorax or pleural effusion. No acute bony abnormality. Telemetry leads overlie the chest. IMPRESSION: No active cardiopulmonary disease. Electronically Signed   By: Camellia Candle M.D.   On: 07/25/2024 06:49    EKG/Medicine tests: Ordered and Independent interpretation EKG Interpretation Date/Time:  Wednesday July 25 2024 07:21:55 EDT Ventricular Rate:  71 PR Interval:  158 QRS Duration:  125 QT  Interval:  512 QTC Calculation: 557 R Axis:   46  Text Interpretation: Sinus rhythm Consider left atrial enlargement Nonspecific intraventricular conduction delay Confirmed by Rogelia Satterfield ([id number]) on 07/25/2024 7:25:31 AM                Interventions: Tylenol , Toradol , lidocaine  patch, LR bolus, IV and p.o. potassium repletion  See the EMR for full details regarding lab and imaging results.  The ECG reveals no anatomical ischemia representing STEMI, New-Onset Arrhythmia, or ischemic equivalent. Sje has been risk stratified with a HEAR score of 4. Initial troponin is undetectable; delta troponin is undetectable.  The patient's presentation, the patient being hemodynamically stable, and the ECG are not consistent with Pericardial Tamponade. The patient's pain is not positional. This in conjunction with the lack of PR depressions and ST elevations on the ECG are reassuring against Pericarditis. The patient's non-elevated troponin and ECG are also inconsistent with Myocarditis.  The CXR is unremarkable for focal airspace disease.  The patient is afebrile and denies productive cough.  Therefore, I do not suspect Pneumonia. There is no evidence of Pneumothorax on physical exam or on the CXR. CXR shows no evidence of Esophageal Tear and there is no recent intractable emesis or esophageal instrumentation. There is no peritonitis or free air on CXR worrisome for a Perforated Abdominal Viscous.  Pulmonary Embolism is on the differential. The patient is at  risk via the Revised Geneva Criteria. Therefore, we will further risk stratify the patient with a d-dimer.  This was WNL. Therefore, a CTA not indicated.  The patient's pain is not tearing and it does not radiate to back. Pulses are present bilaterally in both the upper and lower extremities. CXR does not show a widened mediastinum. I have a very low suspicion for Aortic Dissection.  Patient does have tenderness to palpation of chest wall which was  partially improved after Toradol , lidocaine  patch, Tylenol , therefore feel that musculoskeletal chest wall pain is most likely etiology of chest pain.  While patient was in the ED, she did have significant hypokalemia, has a history of hypokalemia and takes p.o. potassium repletion at home.  Feel that chronic hypokalemia likely exacerbated by recent diarrhea which patient attributes to her IBS.  Repleted and on recheck improved to 2.9.  Given additional 40 mill equivalents p.o. as well as magnesium  oxide p.o.  Patient without symptomatic hypokalemia, patient with high healthcare literacy  as patient is a [employer].  Discussed risks and benefits of admission versus discharge with outpatient course of oral potassium and close follow-up with pediatrician, patient is comfortable with outpatient regimen, prescribed 5-day course of 40 mill equivalents p.o. potassium chloride  as well as recommendation for magnesium  oxide, and close follow-up with PCP.  Patient discharged in stable condition  Presentation is most consistent with acute complicated illness, Current presentation is complicated by underlying chronic conditions, and I did consider and rule out acute life/limb-threatening illness  Discussion of management or test interpretations with external provider(s): Not indicated  Risk Drugs:OTC drugs and Prescription drug management  Disposition: DISCHARGE: I believe that the patient is safe for discharge home with outpatient follow-up. Patient was informed of all pertinent physical exam, laboratory, and imaging findings. Patient's suspected etiology of their symptom presentation was discussed with the patient and all questions were answered. We discussed following up with PCP. I provided thorough ED return precautions. The patient feels safe and comfortable with this plan.  MDM generated using voice dictation software and may contain dictation errors.  Please contact me for any clarification or with any  questions.  Clinical Impression:  1. Chest wall pain   2. Hypokalemia      Discharge   Final Clinical Impression(s) / ED Diagnoses Final diagnoses:  Chest wall pain  Hypokalemia    Rx / DC Orders ED Discharge Orders          Ordered    potassium chloride  (KLOR-CON ) 10 MEQ tablet  Daily        07/25/24 1257             Rogelia Jerilynn RAMAN, MD 07/25/24 2124

## 2024-07-25 NOTE — ED Notes (Signed)
 Assuming pt care at this time.

## 2024-07-25 NOTE — Discharge Instructions (Addendum)
 Dawn Reynolds  Thank you for allowing us  to take care of you today.  You came to the Emergency Department today because you had pain in the left side of your chest.  Here you are tender when you press on your chest, however the rest of your workup was reassuring from a chest pain standpoint, your blood clot risk factor number is not elevated, your heart number was normal, your chest x-ray was reassuring, therefore we do not think that there is a emergency cause of your chest pain.  Recommend Tylenol , ibuprofen , and lidocaine  patches which she can buy over-the-counter.  You did have some low potassium here in the emergency department, we gave you some potassium repletion as well as some magnesium  and it is improved on recheck.  We recommend taking 40 mill equivalents of potassium daily by mouth as well as 400 mg of magnesium  oxide daily by mouth and having this rechecked with your primary care doctor next week.  If you have generalized weakness, muscle cramp, fatigue, or new or different symptoms, please come back to the emergency department for reevaluation.  We will prescribe the potassium pills, magnesium  oxide pills or over-the-counter  To-Do: 1. Please follow-up with your primary doctor within 5-7 days / as soon as possible.    Please return to the Emergency Department or call 911 if you experience have worsening of your symptoms, or do not get better, above symptoms, chest pain, shortness of breath, severe or significantly worsening pain, high fever, severe confusion, pass out or have any reason to think that you need emergency medical care.   We hope you feel better soon.   Mitzie Later, MD Department of Emergency Medicine [facility] [employer]

## 2024-07-25 NOTE — ED Triage Notes (Signed)
 Left arm discomfort started last night.  Didn't feel well since 5 days.  Pressure discomfort in chest when lying on right side.

## 2024-07-27 ENCOUNTER — Encounter: Payer: Self-pay | Admitting: Family Medicine

## 2024-07-27 ENCOUNTER — Other Ambulatory Visit (INDEPENDENT_AMBULATORY_CARE_PROVIDER_SITE_OTHER)

## 2024-07-27 DIAGNOSIS — E876 Hypokalemia: Secondary | ICD-10-CM

## 2024-07-27 LAB — COMPREHENSIVE METABOLIC PANEL WITH GFR
ALT: 20 U/L (ref 0–35)
AST: 21 U/L (ref 0–37)
Albumin: 4.1 g/dL (ref 3.5–5.2)
Alkaline Phosphatase: 54 U/L (ref 39–117)
BUN: 10 mg/dL (ref 6–23)
CO2: 28 meq/L (ref 19–32)
Calcium: 9.4 mg/dL (ref 8.4–10.5)
Chloride: 105 meq/L (ref 96–112)
Creatinine, Ser: 0.76 mg/dL (ref 0.40–1.20)
GFR: 86.58 mL/min (ref >60.00)
Glucose, Bld: 94 mg/dL (ref 70–99)
Potassium: 3.8 meq/L (ref 3.5–5.1)
Sodium: 142 meq/L (ref 135–145)
Total Bilirubin: 1.6 mg/dL — ABNORMAL HIGH (ref 0.2–1.2)
Total Protein: 6.5 g/dL (ref 6.0–8.3)

## 2024-07-28 ENCOUNTER — Other Ambulatory Visit: Payer: Self-pay | Admitting: Family Medicine

## 2024-07-28 DIAGNOSIS — Z5181 Encounter for therapeutic drug level monitoring: Secondary | ICD-10-CM

## 2024-08-01 ENCOUNTER — Encounter: Payer: Self-pay | Admitting: Family Medicine

## 2024-08-01 ENCOUNTER — Other Ambulatory Visit (INDEPENDENT_AMBULATORY_CARE_PROVIDER_SITE_OTHER)

## 2024-08-01 DIAGNOSIS — E876 Hypokalemia: Secondary | ICD-10-CM

## 2024-08-01 LAB — COMPREHENSIVE METABOLIC PANEL WITH GFR
ALT: 27 U/L (ref 0–35)
AST: 37 U/L (ref 0–37)
Albumin: 4.5 g/dL (ref 3.5–5.2)
Alkaline Phosphatase: 55 U/L (ref 39–117)
BUN: 12 mg/dL (ref 6–23)
CO2: 29 meq/L (ref 19–32)
Calcium: 9.6 mg/dL (ref 8.4–10.5)
Chloride: 103 meq/L (ref 96–112)
Creatinine, Ser: 0.93 mg/dL (ref 0.40–1.20)
GFR: 67.94 mL/min (ref >60.00)
Glucose, Bld: 87 mg/dL (ref 70–99)
Potassium: 4.8 meq/L (ref 3.5–5.1)
Sodium: 142 meq/L (ref 135–145)
Total Bilirubin: 1.2 mg/dL (ref 0.2–1.2)
Total Protein: 7 g/dL (ref 6.0–8.3)

## 2024-08-01 NOTE — Addendum Note (Signed)
 Addended by: ESTELLE GILLIS D on: 08/01/2024 09:40 AM   Modules accepted: Orders

## 2024-08-09 NOTE — Progress Notes (Deleted)
 Ellouise Console, PA-C [address] [place], [place]  [address] Phone: [phone]   Primary Care Physician: Watt Harlene BROCKS, MD  Primary Gastroenterologist:  Ellouise Console, PA-C / Norleen Kiang, MD   Chief Complaint:  F/U        HPI:   Caryle Helgeson is a 58 y.o. female returns for 11-month follow-up of multiple GI symptoms including generalized upper and lower abdominal pain, heartburn, belching, bloating, gas, decreased appetite, nausea, vomiting, constipation, and diarrhea.  History of irritable bowel syndrome and GERD.  She was started on Mounjaro , which made her GI symptoms worse (especially on higher dose).  Diagnosed with GERD and IBS many years ago.  She was taken off Mounjaro .  Restarted low-dose Mounjaro  2 months ago and is tolerating that better.  She denies alarm symptoms such as rectal bleeding or anemia.  2 months ago she was switched from Prilosec 20 to pantoprazole  40 mg once daily.  Also gave samples of Linzess  145 for constipation.   Currently takes Prilosec 20 Mg once daily. Abdominal x-ray 05/20/2023 showed constipation but no bowel obstruction.  Prior Cholecystectomy in 2019.   05/2021 EGD done in [place]: Medium hiatal hernia.  Normal esophagus, stomach and duodenum.  Biopsies negative for H. pylori.   She reports having last colonoscopy in 2023 done in [place] by Dr. Ashley.  Reportedly normal.   Patient has chronically elevated LFTs attributed to hepatic steatosis.  Most recent labs 05/16/2024 showed AST 43, ALT 52, total bilirubin 1.3, alk phos 63.  Normal Hgb 16.0.  Negative acute viral hepatitis A/B/C labs.     10/2023 RUQ abdominal ultrasound with elastography: Hepatic steatosis, prior cholecystectomy, low median K PA 2.3.  No fibrosis.   07/2018 EGD by Dr. Kiang: Small hiatal hernia, otherwise normal.  No evidence of Barrett's.  No biopsies.   07/2018 colonoscopy by Dr. Kiang: 1 small 6 mm sessile serrated polyp removed.  Diverticulosis.  Difficult  procedure due to extremely redundant colon and body habitus.  10-year repeat colonoscopy was recommended (due 07/2028).  Current Outpatient Medications  Medication Sig Dispense Refill   ALPRAZolam  (XANAX ) 0.25 MG tablet Take 1 tablet (0.25 mg total) by mouth 2 (two) times daily as needed for anxiety. 30 tablet 1   amLODipine  (NORVASC ) 10 MG tablet Take 1 tablet (10 mg total) by mouth daily. 90 tablet 3   aspirin  EC 81 MG tablet Take 1 tablet (81 mg total) by mouth 2 (two) times daily. (Patient taking differently: Take 81 mg by mouth at bedtime. Pt takes once daily at night) 60 tablet 0   cyclobenzaprine  (FLEXERIL ) 10 MG tablet TAKE 1 TABLET BY MOUTH AT BEDTIME AS NEEDED FOR MUSCLE SPASMS. 30 tablet 2   DULoxetine  (CYMBALTA ) 60 MG capsule Take 1 capsule (60 mg total) by mouth 2 (two) times daily. 180 capsule 3   gabapentin  (NEURONTIN ) 300 MG capsule Take 1 capsule (300 mg total) by mouth 3 (three) times daily. (Patient not taking: Reported on 06/13/2024) 270 capsule 0   hydrochlorothiazide  (MICROZIDE ) 12.5 MG capsule Take 1 capsule (12.5 mg total) by mouth daily. 90 capsule 1   icosapent  Ethyl (VASCEPA ) 1 g capsule Take 2 capsules (2 g total) by mouth 2 (two) times daily. 360 capsule 1   lubiprostone  (AMITIZA ) 24 MCG capsule Take 1 capsule (24 mcg total) by mouth 2 (two) times daily with a meal. 60 capsule 3   pantoprazole  (PROTONIX ) 40 MG tablet Take 1 tablet (40 mg total) by  mouth daily. 90 tablet 3   potassium chloride  (KLOR-CON ) 10 MEQ tablet Take 4 tablets (40 mEq total) by mouth daily for 5 days. 20 tablet 0   potassium chloride  SA (KLOR-CON  M) 20 MEQ tablet TAKE 1 TABLET (20 MEQ TOTAL) BY MOUTH DAILY. TAKE IF USING A DOUBLE DOSE OF DIURETIC FOR SWELLING 90 tablet 1   rosuvastatin  (CRESTOR ) 20 MG tablet Take 1 tablet (20 mg total) by mouth daily. 90 tablet 3   tirzepatide  (MOUNJARO ) 5 MG/0.5ML Pen Inject 5 mg into the skin once a week. 6 mL 1   No current facility-administered medications for  this visit.    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   Chicken pox    Depression    hx of in past situational   DM (diabetes mellitus) (HCC)    Fibromyalgia    Gallstones    GERD (gastroesophageal reflux disease)    Headache    hx of migraines   History of kidney stones    1996   HLD (hyperlipidemia)    Hypertension    IBS (irritable bowel syndrome)    Obesity    Pre-diabetes    Sleep apnea    CPAP but not using   Status post dilation of esophageal narrowing     Past Surgical History:  Procedure Laterality Date   ABDOMINAL HYSTERECTOMY     CHOLECYSTECTOMY N/A 03/31/2018   Procedure: LAPAROSCOPIC CHOLECYSTECTOMY;  Surgeon: Rubin Calamity, MD;  Location: [facility];  Service: General;  Laterality: N/A;   COLONOSCOPY     endoplantar fasciotomy Right    GALLBLADDER SURGERY  03/31/2018   HIP ARTHROPLASTY Right 2023   KNEE ARTHROSCOPY Left    left knee 2012   REPLACEMENT TOTAL KNEE BILATERAL Bilateral 09/27/2018   TONSILLECTOMY     1999   TOTAL KNEE ARTHROPLASTY Bilateral 09/27/2018   Procedure: TOTAL KNEE BILATERAL;  Surgeon: Liam Lerner, MD;  Location: [facility];  Service: Orthopedics;  Laterality: Bilateral;   TRANSFORAMINAL LUMBAR INTERBODY FUSION (TLIF) WITH PEDICLE SCREW FIXATION 1 LEVEL Left 02/24/2023   Procedure: LEFT-SIDED LUMBAR 3- LUMBAR 4 TRANSFORAMINAL LUMBAR INTERBODY FUSION AND DECOMPRESSION WITH INSTRUMENTATION AND ALLOGRAFT;  Surgeon: Beuford Anes, MD;  Location: [facility];  Service: Orthopedics;  Laterality: Left;   UPPER GASTROINTESTINAL ENDOSCOPY      Review of Systems:    All systems reviewed and negative except where noted in HPI.    Physical Exam:  LMP  06/09/2015 (Approximate)  Patient's last menstrual period was 06/09/2015 (approximate).  General: Well-nourished, well-developed in no acute distress.  Lungs: Clear to auscultation bilaterally. Non-labored. Heart: Regular rate and rhythm, no murmurs rubs or gallops.  Abdomen: Bowel sounds are normal; Abdomen is Soft; No hepatosplenomegaly, masses or hernias;  No Abdominal Tenderness; No guarding or rebound tenderness. Neuro: Alert and oriented x 3.  Grossly intact.  Psych: Alert and cooperative, normal mood and affect.   Imaging Studies: DG Chest 2 View Result Date: 07/25/2024 CLINICAL DATA:  Chest pain and left arm discomfort.  Chest pressure. EXAM: CHEST - 2 VIEW COMPARISON:  05/16/2024 FINDINGS: The cardiopericardial silhouette is within normal limits for size.  The lungs are clear without focal pneumonia, edema, pneumothorax or pleural effusion. No acute bony abnormality. Telemetry leads overlie the chest. IMPRESSION: No active cardiopulmonary disease. Electronically Signed   By: Camellia Candle M.D.   On: 07/25/2024 06:49    Labs: CBC    Component Value Date/Time   WBC 10.5 07/25/2024 0624   RBC 5.51 (H) 07/25/2024 0624   HGB 16.3 (H) 07/25/2024 0624   HCT 46.8 (H) 07/25/2024 0624   PLT 246 07/25/2024 0624   MCV 84.9 07/25/2024 0624   MCH 29.6 07/25/2024 0624   MCHC 34.8 07/25/2024 0624   RDW 14.8 07/25/2024 0624   LYMPHSABS 2.4 09/26/2018 1405   MONOABS 0.6 09/26/2018 1405   EOSABS 0.2 09/26/2018 1405   BASOSABS 0.1 09/26/2018 1405    CMP     Component Value Date/Time   NA 142 08/01/2024 0941   NA 141 05/21/2019 1116   K 4.8 08/01/2024 0941   CL 103 08/01/2024 0941   CO2 29 08/01/2024 0941   GLUCOSE 87 08/01/2024 0941   BUN 12 08/01/2024 0941   BUN 17 06/02/2020 0000   CREATININE 0.93 08/01/2024 0941   CALCIUM  9.6 08/01/2024 0941   PROT 7.0 08/01/2024 0941   PROT 6.9 05/21/2019 1116   ALBUMIN 4.5 08/01/2024 0941   ALBUMIN 4.7 05/21/2019 1116   AST 37 08/01/2024  0941   ALT 27 08/01/2024 0941   ALKPHOS 55 08/01/2024 0941   BILITOT 1.2 08/01/2024 0941   BILITOT 1.2 05/21/2019 1116   GFRNONAA >60 07/25/2024 1045   GFRAA 86 05/21/2019 1116       Assessment and Plan:   Danielys Madry is a 58 y.o. y/o female ***    Ellouise Console, PA-C  Follow up ***

## 2024-08-10 ENCOUNTER — Ambulatory Visit: Admitting: Physician Assistant

## 2024-08-21 ENCOUNTER — Encounter: Payer: Self-pay | Admitting: Family Medicine

## 2024-09-21 ENCOUNTER — Other Ambulatory Visit: Payer: Self-pay | Admitting: Family Medicine

## 2024-09-21 DIAGNOSIS — M797 Fibromyalgia: Secondary | ICD-10-CM

## 2024-09-21 DIAGNOSIS — E781 Pure hyperglyceridemia: Secondary | ICD-10-CM

## 2024-09-26 ENCOUNTER — Encounter: Payer: Self-pay | Admitting: Family Medicine

## 2024-09-26 DIAGNOSIS — E118 Type 2 diabetes mellitus with unspecified complications: Secondary | ICD-10-CM

## 2024-09-26 MED ORDER — TIRZEPATIDE 7.5 MG/0.5ML ~~LOC~~ SOAJ: 7.5000 mg | SUBCUTANEOUS | 1 refills | Status: DC

## 2024-09-28 ENCOUNTER — Other Ambulatory Visit: Payer: Self-pay | Admitting: Family Medicine

## 2024-09-28 DIAGNOSIS — M797 Fibromyalgia: Secondary | ICD-10-CM

## 2024-09-28 DIAGNOSIS — F419 Anxiety disorder, unspecified: Secondary | ICD-10-CM

## 2024-10-01 NOTE — Telephone Encounter (Signed)
 Requesting: Xanax  0.25MG  Contract: N/A UDS: N/A Last Visit: 05/16/2024 Next Visit: 10/03/2024 Last Refill: 07/24/2024  Please Advise

## 2024-10-02 NOTE — Progress Notes (Unsigned)
 [facility] [address], [address] [place], [place] [address] [phone]  Date:  10/03/2024   Name:  Dawn Reynolds   DOB:  28-Jun-1966   MRN:  [id number]  PCP:  Watt Harlene BROCKS, MD    Chief Complaint: No chief complaint on file.   History of Present Illness:  Dawn Reynolds is a 58 y.o. very pleasant female patient who presents with the following:  Patient seen today for physical exam.  I saw her most recently in July for follow-up History of hypertension, dyslipidemia,diabetes, obesity and neuropathy, lumbar fusion 2024  At her visit in July she was having success losing weight with Mounjaro  but was having some side effects.  However, she was able to push through and is now doing okay with Mounjaro -she is actually up to 7.5 mg and is tolerating okay  Eye exam Flu shot Can update A1c Update urine micro Foot exam Pneumonia vaccination Shingrix  She had some labs done in September-CMP Lipids checked in July, looked good Mammogram, colon cancer screening up-to-date Status post hysterectomy  Amlodipine  10 Cymbalta  60 twice daily HCTZ 12.5 Vascepa  Pantoprazole  Amitiza  (for chronic constipation)  Crestor  20 Tirzepatide  7.5 Alprazolam  as needed  Discussed the use of AI scribe software for clinical note transcription with the patient, who gave verbal consent to proceed.  History of Present Illness     Patient Active Problem List   Diagnosis Date Noted   Radiculopathy due to lumbar intervertebral disc disorder 02/24/2023   S/P TKR (total knee replacement), bilateral 09/27/2018   Bilateral primary osteoarthritis of knee 09/26/2018   Obesity (BMI 35.0-39.9 without comorbidity) 04/06/2018   High triglycerides 04/06/2018   Osteoarthritis of left knee 12/21/2017   Chest pain 07/09/2017   Fibromyalgia 07/09/2017   Hypertension 07/09/2017   Abnormal weight gain 01/31/2014   Empty sella syndrome 01/31/2014    Controlled diabetes mellitus type 2 with complications (HCC) 01/31/2014   Neuropathy, peripheral 12/25/2013    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   Chicken pox    Depression    hx of in past situational   DM (diabetes mellitus) (HCC)    Fibromyalgia    Gallstones    GERD (gastroesophageal reflux disease)    Headache    hx of migraines   History of kidney stones    1996   HLD (hyperlipidemia)    Hypertension    IBS (irritable bowel syndrome)    Obesity    Pre-diabetes    Sleep apnea    CPAP but not using   Status post dilation of esophageal narrowing     Past Surgical History:  Procedure Laterality Date   ABDOMINAL HYSTERECTOMY     CHOLECYSTECTOMY N/A 03/31/2018   Procedure: LAPAROSCOPIC CHOLECYSTECTOMY;  Surgeon: Rubin Calamity, MD;  Location: [facility];  Service: General;  Laterality: N/A;   COLONOSCOPY     endoplantar fasciotomy Right    GALLBLADDER SURGERY  03/31/2018   HIP ARTHROPLASTY Right 2023   KNEE ARTHROSCOPY Left    left knee 2012   REPLACEMENT TOTAL KNEE BILATERAL Bilateral 09/27/2018   TONSILLECTOMY     1999   TOTAL KNEE ARTHROPLASTY Bilateral 09/27/2018   Procedure: TOTAL KNEE BILATERAL;  Surgeon: Liam Lerner, MD;  Location: [facility];  Service:  Orthopedics;  Laterality: Bilateral;   TRANSFORAMINAL LUMBAR INTERBODY FUSION (TLIF) WITH PEDICLE SCREW FIXATION 1 LEVEL Left 02/24/2023   Procedure: LEFT-SIDED LUMBAR 3- LUMBAR 4 TRANSFORAMINAL LUMBAR INTERBODY FUSION AND DECOMPRESSION WITH INSTRUMENTATION AND ALLOGRAFT;  Surgeon: Beuford Anes, MD;  Location: [facility];  Service: Orthopedics;  Laterality: Left;   UPPER GASTROINTESTINAL ENDOSCOPY      Social History   Tobacco Use   Smoking status: Never   Smokeless tobacco: Never  Vaping Use   Vaping status: Never Used  Substance Use Topics   Alcohol use: Yes    Comment: occ   Drug use: No     Family History  Problem Relation Age of Onset   Mitral valve prolapse Mother    Heart disease Mother    Breast cancer Mother    Heart disease Father    Prostate cancer Father    Diabetes Father    Irritable bowel syndrome Father    Kidney disease Father    Celiac disease Brother    Crohn's disease Brother    Celiac disease Brother    Colon cancer Maternal Grandmother    Myasthenia gravis Maternal Grandmother    Hypertension Maternal Grandmother    Other Maternal Grandfather        bleeding disorder   Osteoporosis Maternal Grandfather    Stomach cancer Paternal Grandmother    Pancreatic cancer Maternal Aunt    Liver cancer Paternal Uncle    Colon cancer Paternal Uncle    Other Daughter        Dysautonomia orthostatic intolerance   Esophageal cancer Neg Hx    Rectal cancer Neg Hx     Allergies  Allergen Reactions   Aspartame Other (See Comments)    Headaches.   Demerol  [Meperidine ] Nausea And Vomiting    Medication list has been reviewed and updated.  Current Outpatient Medications on File Prior to Visit  Medication Sig Dispense Refill   ALPRAZolam  (XANAX ) 0.25 MG tablet TAKE 1 TABLET BY MOUTH 2 TIMES DAILY AS NEEDED FOR ANXIETY. 30 tablet 0   amLODipine  (NORVASC ) 10 MG tablet Take 1 tablet (10 mg total) by mouth daily. 90 tablet 3   aspirin  EC 81 MG tablet Take 1 tablet (81 mg total) by mouth 2 (two) times daily. (Patient taking differently: Take 81 mg by mouth at bedtime. Pt takes once daily at night) 60 tablet 0   cyclobenzaprine  (FLEXERIL ) 10 MG tablet TAKE 1 TABLET BY MOUTH AT BEDTIME AS NEEDED FOR MUSCLE SPASMS. 30 tablet 2   DULoxetine  (CYMBALTA ) 60 MG capsule TAKE 1 CAPSULE BY MOUTH 2 TIMES DAILY. 180 capsule 3   gabapentin  (NEURONTIN ) 300 MG capsule Take 1 capsule (300 mg total) by mouth 3 (three) times daily. (Patient not taking: Reported on 06/13/2024) 270 capsule 0   hydrochlorothiazide  (MICROZIDE ) 12.5 MG capsule Take 1 capsule (12.5 mg total) by mouth  daily. 90 capsule 1   icosapent  Ethyl (VASCEPA ) 1 g capsule TAKE 2 CAPSULES BY MOUTH 2 TIMES DAILY. 360 capsule 1   lubiprostone  (AMITIZA ) 24 MCG capsule Take 1 capsule (24 mcg total) by mouth 2 (two) times daily with a meal. 60 capsule 3   pantoprazole  (PROTONIX ) 40 MG tablet Take 1 tablet (40 mg total) by mouth daily. 90 tablet 3   potassium chloride  (KLOR-CON ) 10 MEQ tablet Take 4 tablets (40 mEq total) by mouth daily for 5 days. 20 tablet 0   potassium chloride  SA (KLOR-CON  M) 20 MEQ tablet TAKE 1 TABLET (20 MEQ TOTAL) BY MOUTH  DAILY. TAKE IF USING A DOUBLE DOSE OF DIURETIC FOR SWELLING 90 tablet 1   rosuvastatin  (CRESTOR ) 20 MG tablet Take 1 tablet (20 mg total) by mouth daily. 90 tablet 3   tirzepatide  (MOUNJARO ) 7.5 MG/0.5ML Pen Inject 7.5 mg into the skin once a week. 6 mL 1   No current facility-administered medications on file prior to visit.    Review of Systems:  As per HPI- otherwise negative.   Physical Examination: There were no vitals filed for this visit. There were no vitals filed for this visit. There is no height or weight on file to calculate BMI. Ideal Body Weight:    GEN: no acute distress. HEENT: Atraumatic, Normocephalic.  Ears and Nose: No external deformity. CV: RRR, No M/G/R. No JVD. No thrill. No extra heart sounds. PULM: CTA B, no wheezes, crackles, rhonchi. No retractions. No resp. distress. No accessory muscle use. ABD: S, NT, ND, +BS. No rebound. No HSM. EXTR: No c/c/e PSYCH: Normally interactive. Conversant.    Assessment and Plan: No diagnosis found.  Assessment & Plan   Signed Harlene Schroeder, MD

## 2024-10-03 ENCOUNTER — Ambulatory Visit: Admitting: Family Medicine

## 2024-10-03 VITALS — BP 126/86 | HR 81 | Temp 97.9°F | Ht 64.0 in | Wt 224.8 lb

## 2024-10-03 DIAGNOSIS — Z1329 Encounter for screening for other suspected endocrine disorder: Secondary | ICD-10-CM

## 2024-10-03 DIAGNOSIS — E781 Pure hyperglyceridemia: Secondary | ICD-10-CM

## 2024-10-03 DIAGNOSIS — Z0000 Encounter for general adult medical examination without abnormal findings: Secondary | ICD-10-CM | POA: Diagnosis not present

## 2024-10-03 DIAGNOSIS — Z130 Encounter for screening for diseases of the blood and blood-forming organs and certain disorders involving the immune mechanism: Secondary | ICD-10-CM

## 2024-10-03 DIAGNOSIS — Z23 Encounter for immunization: Secondary | ICD-10-CM

## 2024-10-03 DIAGNOSIS — E118 Type 2 diabetes mellitus with unspecified complications: Secondary | ICD-10-CM | POA: Diagnosis not present

## 2024-10-03 DIAGNOSIS — I10 Essential (primary) hypertension: Secondary | ICD-10-CM

## 2024-10-03 DIAGNOSIS — N289 Disorder of kidney and ureter, unspecified: Secondary | ICD-10-CM

## 2024-10-03 NOTE — Patient Instructions (Addendum)
 It was good to see you today- I will be in touch with your labs asap  2nd dose of shingrix  can be given in 2-6 months here or at your pharmacy  Take care!

## 2024-10-04 ENCOUNTER — Encounter: Payer: Self-pay | Admitting: Family Medicine

## 2024-10-04 LAB — BASIC METABOLIC PANEL WITH GFR
BUN/Creatinine Ratio: 15 (calc) (ref 6–22)
BUN: 18 mg/dL (ref 7–25)
CO2: 24 mmol/L (ref 20–32)
Calcium: 9.3 mg/dL (ref 8.6–10.4)
Chloride: 102 mmol/L (ref 98–110)
Creat: 1.18 mg/dL — ABNORMAL HIGH (ref 0.50–1.03)
Glucose, Bld: 85 mg/dL (ref 65–99)
Potassium: 3.3 mmol/L — ABNORMAL LOW (ref 3.5–5.3)
Sodium: 139 mmol/L (ref 135–146)
eGFR: 54 mL/min/1.73m2 — ABNORMAL LOW (ref >=60)

## 2024-10-04 LAB — CBC
HCT: 48.8 % — ABNORMAL HIGH (ref 35.9–46.0)
Hemoglobin: 16.2 g/dL — ABNORMAL HIGH (ref 11.7–15.5)
MCH: 29.3 pg (ref 27.0–33.0)
MCHC: 33.2 g/dL (ref 31.6–35.4)
MCV: 88.2 fL (ref 81.4–101.7)
MPV: 10.2 fL (ref 7.5–12.5)
Platelets: 247 10*3/uL (ref 140–400)
RBC: 5.53 Million/uL — ABNORMAL HIGH (ref 3.80–5.10)
RDW: 13.1 % (ref 11.0–15.0)
WBC: 8.8 10*3/uL (ref 3.8–10.8)

## 2024-10-04 LAB — MICROALBUMIN / CREATININE URINE RATIO
Creatinine, Urine: 363 mg/dL — ABNORMAL HIGH (ref 20–275)
Microalb Creat Ratio: 11 mg/g{creat}
Microalb, Ur: 3.9 mg/dL

## 2024-10-04 LAB — HEMOGLOBIN A1C
Hgb A1c MFr Bld: 5.8 % — ABNORMAL HIGH (ref <5.7)
Mean Plasma Glucose: 120 mg/dL
eAG (mmol/L): 6.6 mmol/L

## 2024-10-04 LAB — LIPID PANEL
Cholesterol: 103 mg/dL
HDL: 48 mg/dL — ABNORMAL LOW
LDL Cholesterol (Calc): 31 mg/dL
Non-HDL Cholesterol (Calc): 55 mg/dL
Total CHOL/HDL Ratio: 2.1 (calc)
Triglycerides: 163 mg/dL — ABNORMAL HIGH

## 2024-10-04 LAB — TSH: TSH: 2.88 m[IU]/L (ref 0.40–4.50)

## 2024-10-04 NOTE — Addendum Note (Signed)
 Addended by: WATT RAISIN C on: 10/04/2024 07:39 AM   Modules accepted: Orders

## 2024-10-05 ENCOUNTER — Ambulatory Visit: Payer: Self-pay | Admitting: Family Medicine

## 2024-10-11 ENCOUNTER — Other Ambulatory Visit: Payer: Self-pay | Admitting: Physician Assistant

## 2024-10-24 ENCOUNTER — Encounter: Payer: Self-pay | Admitting: Family Medicine

## 2024-10-24 MED ORDER — CEPHALEXIN 500 MG PO CAPS: 500.0000 mg | ORAL_CAPSULE | Freq: Two times a day (BID) | ORAL | 0 refills | Status: AC

## 2024-10-24 NOTE — Addendum Note (Signed)
 Addended by: WATT RAISIN C on: 10/24/2024 02:14 PM   Modules accepted: Orders
